# Patient Record
Sex: Male | Born: 1965 | Race: White | Hispanic: No | Marital: Single | Smoking: Current every day smoker
Health system: Southern US, Community
[De-identification: ages and names within clinical notes are randomized; demographics above are authoritative.]

## PROBLEM LIST (undated history)

## (undated) DIAGNOSIS — E039 Hypothyroidism, unspecified: Secondary | ICD-10-CM

## (undated) DIAGNOSIS — C801 Malignant (primary) neoplasm, unspecified: Secondary | ICD-10-CM

## (undated) DIAGNOSIS — F32A Depression, unspecified: Secondary | ICD-10-CM

## (undated) DIAGNOSIS — M1990 Unspecified osteoarthritis, unspecified site: Secondary | ICD-10-CM

---

## 2018-12-03 DIAGNOSIS — C801 Malignant (primary) neoplasm, unspecified: Secondary | ICD-10-CM

## 2018-12-03 HISTORY — DX: Malignant (primary) neoplasm, unspecified: C80.1

## 2019-12-21 ENCOUNTER — Other Ambulatory Visit: Payer: Self-pay | Admitting: Radiation Oncology

## 2020-02-10 ENCOUNTER — Telehealth: Payer: Self-pay | Admitting: Hematology

## 2020-02-10 NOTE — Telephone Encounter (Signed)
Received a new pt referral from Dr. Cheryle Horsfall at [place] at [facility] for malignant neoplasm of floor of mouth. I cld and spoke to the pt's sister who's aware to arrive 15 minutes early.

## 2020-02-14 ENCOUNTER — Telehealth: Payer: Self-pay | Admitting: Radiation Oncology

## 2020-02-14 ENCOUNTER — Telehealth (HOSPITAL_COMMUNITY): Payer: Self-pay | Admitting: Dentistry

## 2020-02-14 NOTE — Telephone Encounter (Signed)
I have called and the sister of the patient, Vicente Males, stated that they were not ready to schedule a radiation consult. She states that she will call us when they are ready.

## 2020-02-14 NOTE — Telephone Encounter (Signed)
02/14/2020  Patient:            Casey [name] Date of Birth:  10-24-1965 MRN:                [id number]   I had a telephone conversation with the patient's sister, Hazle Coca. I discussed request for Pre-chemoradiation therapy Dental consultation from [name].  Sister indicates that she is currently scheduled for a Med. Onc. Consultation with Dr. Irene Limbo on Thursday, 02/18/20 at 1 pm to discuss possible treatment options. Sister indicates that the patient is completely edentulous and that she does NOT wish to proceed with scheduling Dental consult until she has met with Dr. Irene Limbo and [name]. Sister is aware that I will be unavailable for consult the week of 7/19 thru 02/24/20. I will follow up with sister for possible dental consult if so desired upon my return on 02/27/20.  Dr. Enrique Sack

## 2020-02-16 ENCOUNTER — Telehealth: Payer: Self-pay | Admitting: Hematology

## 2020-02-16 ENCOUNTER — Telehealth: Payer: Self-pay | Admitting: Hematology and Oncology

## 2020-02-16 ENCOUNTER — Inpatient Hospital Stay: Payer: Self-pay | Admitting: Hematology

## 2020-02-16 NOTE — Telephone Encounter (Signed)
Pt cld back to reschedule his appt to Dr. Lorenso Courier on 7/29 at 9am. Pt needed an earlier appt due to his sister's work schedule.

## 2020-02-16 NOTE — Telephone Encounter (Signed)
I received a scheduling msg to reschedule Mr. Laughter appt w/Dr. Irene Limbo. I cld and lft a vm to r/s the appt.

## 2020-02-20 ENCOUNTER — Telehealth: Payer: Self-pay | Admitting: *Deleted

## 2020-02-20 ENCOUNTER — Other Ambulatory Visit: Payer: Self-pay | Admitting: Radiation Oncology

## 2020-02-20 ENCOUNTER — Ambulatory Visit
Admission: RE | Admit: 2020-02-20 | Discharge: 2020-02-20 | Disposition: A | Discharge status: Home / Self Care | Payer: Self-pay | Source: Ambulatory Visit | Attending: Radiation Oncology | Admitting: Radiation Oncology

## 2020-02-20 DIAGNOSIS — C069 Malignant neoplasm of mouth, unspecified: Secondary | ICD-10-CM

## 2020-02-20 NOTE — Telephone Encounter (Signed)
PET scan images and reports arrived today and have been uploaded and scanned.

## 2020-02-22 LAB — SURGICAL PATHOLOGY

## 2020-03-01 ENCOUNTER — Inpatient Hospital Stay: Payer: Self-pay

## 2020-03-01 ENCOUNTER — Inpatient Hospital Stay: Payer: Self-pay | Attending: Hematology | Admitting: Hematology and Oncology

## 2020-03-01 NOTE — Progress Notes (Deleted)
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   CHIEF COMPLAINTS/PURPOSE OF CONSULTATION:  "*** "  HISTORY OF PRESENTING ILLNESS:  Casey [name] 54 y.o. male with medical history significant for ***  On review of the previous records ***  On exam today ***  MEDICAL HISTORY:  No past medical history on file.  SURGICAL HISTORY: *** The histories are not reviewed yet. Please review them in the "History" navigator section and refresh this [place].  SOCIAL HISTORY: Social History   Socioeconomic History  . Marital status: Not on file    Spouse name: Not on file  . Number of children: Not on file  . Years of education: Not on file  . Highest education level: Not on file  Occupational History  . Not on file  Tobacco Use  . Smoking status: Not on file  Substance and Sexual Activity  . Alcohol use: Not on file  . Drug use: Not on file  . Sexual activity: Not on file  Other Topics Concern  . Not on file  Social History Narrative  . Not on file   Social Determinants of Health   Financial Resource Strain:   . Difficulty of Paying Living Expenses:   Food Insecurity:   . Worried About Charity fundraiser in the Last Year:   . Arboriculturist in the Last Year:   Transportation Needs:   . Film/video editor (Medical):   Marland Kitchen Lack of Transportation (Non-Medical):   Physical Activity:   . Days of Exercise per Week:   . Minutes of Exercise per Session:   Stress:   . Feeling of Stress :   Social Connections:    . Frequency of Communication with Friends and Family:   . Frequency of Social Gatherings with Friends and Family:   . Attends Religious Services:   . Active Member of Clubs or Organizations:   . Attends Archivist Meetings:   Marland Kitchen Marital Status:   Intimate Partner Violence:   . Fear of Current or Ex-Partner:   . Emotionally Abused:   Marland Kitchen Physically Abused:   . Sexually Abused:     FAMILY HISTORY: No family history on file.  ALLERGIES:  has no allergies on file.  MEDICATIONS:  No current outpatient medications on file.   No current facility-administered medications for this visit.    REVIEW OF SYSTEMS:   Constitutional: ( - ) fevers, ( - )  chills , ( - ) night sweats Eyes: ( - ) blurriness of vision, ( - ) double vision, ( - ) watery eyes Ears, nose, mouth, throat, and face: ( - ) mucositis, ( - ) sore throat Respiratory: ( - ) cough, ( - ) dyspnea, ( - ) wheezes Cardiovascular: ( - ) palpitation, ( - ) chest discomfort, ( - ) lower extremity swelling Gastrointestinal:  ( - ) nausea, ( - ) heartburn, ( - ) change in bowel habits Skin: ( - ) abnormal skin rashes Lymphatics: ( - ) new lymphadenopathy, ( - ) easy bruising Neurological: ( - ) numbness, ( - ) tingling, ( - )  new weaknesses Behavioral/Psych: ( - ) mood change, ( - ) new changes  All other systems were reviewed with the patient and are negative.  PHYSICAL EXAMINATION: ECOG PERFORMANCE STATUS: {CHL ONC ECOG PS:[id number]}  There were no vitals filed for this visit. There were no vitals filed for this visit.  GENERAL: well appearing *** in NAD  SKIN: skin color, texture, turgor are normal, no rashes or significant lesions EYES: conjunctiva are pink and non-injected, sclera clear OROPHARYNX: no exudate, no erythema; lips, buccal mucosa, and tongue normal  NECK: supple, non-tender LYMPH:  no palpable lymphadenopathy in the cervical, axillary or inguinal LUNGS: clear to auscultation and percussion with  normal breathing effort HEART: regular rate & rhythm and no murmurs and no lower extremity edema ABDOMEN: soft, non-tender, non-distended, normal bowel sounds Musculoskeletal: no cyanosis of digits and no clubbing  PSYCH: alert & oriented x 3, fluent speech NEURO: no focal motor/sensory deficits  LABORATORY DATA:  I have reviewed the data as listed No flowsheet data found.  No flowsheet data found.   PATHOLOGY: ***  BLOOD FILM: *** Review of the peripheral blood smear showed normal appearing white cells with neutrophils that were appropriately lobated and granulated. There was no predominance of bi-lobed or hyper-segmented neutrophils appreciated. No Dohle bodies were noted. There was no left shifting, immature forms or blasts noted. Lymphocytes remain normal in size without any predominance of large granular lymphocytes. Red cells show no anisopoikilocytosis, macrocytes , microcytes or polychromasia. There were no schistocytes, target cells, echinocytes, acanthocytes, dacrocytes, or stomatocytes.There was no rouleaux formation, nucleated red cells, or intra-cellular inclusions noted. The platelets are normal in size, shape, and color without any clumping evident.  RADIOGRAPHIC STUDIES: I have personally reviewed the radiological images as listed and agreed with the findings in the report. No results found.  ASSESSMENT & PLAN ***  No orders of the defined types were placed in this encounter.   All questions were answered. The patient knows to call the clinic with any problems, questions or concerns.  A total of more than {CHL ONC TIME VISIT - RXYVO:[id number]} were spent on this encounter and over half of that time was spent on counseling and coordination of care as outlined above.   Ledell Peoples, MD Department of Hematology/Oncology [place] at [facility] Phone: [phone] Pager: [phone] Email: [email or web address]  03/01/2020 9:06  AM

## 2020-03-06 NOTE — Progress Notes (Signed)
Casey [name] CONSULT NOTE  Patient Care Team: System, Pcp Not In as PCP - General Malmfelt, Stephani Police, RN as Oncology Nurse Navigator Casey [name], Casey Spring, MD as Consulting Physician (Hematology) Casey Gibson, MD as Attending Physician (Radiation Oncology)  CHIEF COMPLAINTS/PURPOSE OF CONSULTATION:  Newly diagnosed cancer of the floor of the mouth   HISTORY OF PRESENTING ILLNESS:  Casey [name] 54 y.o. [name] is here because of recent diagnosis of a malignant neoplasm of the floor of the mouth. He is referred by Dr. Cheryle [name] at [place] at [facility]. Patient first noted the mass in December 2020. In March 2021 he underwent dental etxractions and his dentists referred him for evaluation. 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 CT maxillofacial on 12/22/19 showed postsurgical changes and mandibular invasion, with cervical lymphadenopathy consistent with metastatic nodes. PET scan on 02/16/20 showed the tongue mass with lymph node metastases on the right floor of the mouth and cervical lymph nodes. He presents to the clinic today for initial evaluation and discussion of treatment options.   I reviewed his records extensively and collaborated the history with the patient.  SUMMARY OF ONCOLOGIC HISTORY: Oncology History  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 ([place])  12/21/2019 Initial Diagnosis   In March 2021 he underwent dental etxractions and his dentists referred him for evaluation. 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 CT maxillofacial on 12/22/19 showed postsurgical changes and mandibular invasion, with cervical lymphadenopathy consistent with metastatic nodes. PET scan on 02/16/20 showed the tongue mass with lymph node metastases on the right floor of the mouth and cervical lymph nodes.   03/07/2020 Cancer  Staging   Staging form: Oral Cavity, AJCC 8th Edition - Clinical: Stage IVA (cT4a, cN2b, cM0) - Signed by Casey Lose, MD on 03/07/2020      MEDICAL HISTORY: No past medical problems other than tobacco abuse and alcohol use  SURGICAL HISTORY: Denies any prior surgeries  SOCIAL HISTORY: Tobacco abuse 1 pack/day, alcohol several beers per day, never married does not have any children FAMILY HISTORY: No family history of cancers  ALLERGIES: No known drug allergies MEDICATIONS:  Current Outpatient Medications  Medication Sig Dispense Refill  . oxyCODONE-acetaminophen (PERCOCET) 5-325 MG tablet Take 1 tablet by mouth every 6 (six) hours as needed for severe pain. 90 tablet 0   No current facility-administered medications for this visit.    REVIEW OF SYSTEMS:   Constitutional: Denies fevers, chills or abnormal night sweats Eyes: Denies blurriness of vision, double vision or watery eyes Ears, nose, mouth, throat, and face: Occasional difficulty with swallowing, edentulous Respiratory: Denies cough, dyspnea or wheezes Cardiovascular: Denies palpitation, chest discomfort or lower extremity swelling Gastrointestinal:  Denies nausea, heartburn or change in bowel habits Skin: Denies abnormal skin rashes Lymphatics: Lymphadenopathy in the neck bilaterally Neurological:Denies numbness, tingling or new weaknesses Behavioral/Psych: Mood is stable, no new changes  All other systems were reviewed with the patient and are negative.  PHYSICAL EXAMINATION: ECOG PERFORMANCE STATUS: 1 - Symptomatic but completely ambulatory  Vitals:   03/07/20 1600  BP: 115/78  Pulse: 75  Resp: 19  Temp: 98.9 F (37.2 C)  SpO2: 100%   Filed Weights   03/07/20 1600  Weight: 133 lb 6.4 oz (60.5 kg)    GENERAL:alert, no distress and comfortable SKIN: skin color, texture, turgor are normal, no rashes or significant lesions EYES:  normal, conjunctiva are pink and non-injected, sclera clear OROPHARYNX:  Bulky bilateral submandibular and cervical lymphadenopathy NECK: supple, thyroid normal size, non-tender, without nodularity LYMPH:  no palpable lymphadenopathy in the cervical, axillary or inguinal LUNGS: clear to auscultation and percussion with normal breathing effort HEART: regular rate & rhythm and no murmurs and no lower extremity edema ABDOMEN:abdomen soft, non-tender and normal bowel sounds Musculoskeletal:no cyanosis of digits and no clubbing  PSYCH: alert & oriented x 3 with fluent speech NEURO: no focal motor/sensory deficits   RADIOGRAPHIC STUDIES: I have personally reviewed the radiological reports and agreed with the findings in the report.  ASSESSMENT AND PLAN:  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 ([place]) 12/21/2019: In March 2021 he underwent dental etxractions and his dentists referred him for evaluation. 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  CT maxillofacial on 12/22/19 showed postsurgical changes and mandibular invasion, with cervical lymphadenopathy consistent with metastatic nodes.  MRI neck: [facility]: 12/23/2019: Floor of mouth mass 3.4 cm with involvement of genioglossus muscle and erosion of inner cortex of the right mandible, multiple necrotic level 1B lymph nodes largest 1.6 cm T4AN2B M0 stage IVa clinical stage  PET scan on 02/16/20 showed the tongue mass with lymph node metastases on the right floor of the mouth and cervical lymph nodes. CK7 and CD 117 are negative, p63 and CK 5/6 are positive, p16 is negative  Counseling: I discussed with the patient that he has a fairly advanced floor of mouth cancer that is invading to the mandible with involvement of lymph nodes.  This makes it a stage IVa disease.  However it is considered treatable and potentially curable.  Recommendation: 1.  Concurrent chemoradiation with weekly cisplatin 2. PEG tube and port placement 3.  Patient  will require IV fluids on a weekly basis 4.  Hearing assessment for baseline  Chemo counseling: I discussed the risks and benefits of cisplatin including the risk of cytopenias, mucositis, neuropathy, hearing loss, fatigue, loss of taste and appetite  Return to clinic to start treatment as soon as radiation is ready.     All questions were answered. The patient knows to call the clinic with any problems, questions or concerns.   Rulon Eisenmenger, MD, MPH 03/07/2020    I, Molly Dorshimer, am acting as scribe for Casey Lose, MD.  I have reviewed the above documentation for accuracy and completeness, and I agree with the above.

## 2020-03-07 ENCOUNTER — Inpatient Hospital Stay: Payer: Medicaid Other | Attending: Hematology | Admitting: Hematology and Oncology

## 2020-03-07 ENCOUNTER — Other Ambulatory Visit: Payer: Self-pay

## 2020-03-07 VITALS — BP 115/78 | HR 75 | Temp 98.9°F | Resp 19 | Ht 64.0 in | Wt 133.4 lb

## 2020-03-07 DIAGNOSIS — F17210 Nicotine dependence, cigarettes, uncomplicated: Secondary | ICD-10-CM | POA: Diagnosis not present

## 2020-03-07 DIAGNOSIS — C049 Malignant neoplasm of floor of mouth, unspecified: Secondary | ICD-10-CM | POA: Insufficient documentation

## 2020-03-07 MED ORDER — DEXAMETHASONE 4 MG PO TABS: 8.0000 mg | ORAL_TABLET | Freq: Every day | ORAL | 1 refills | Status: DC

## 2020-03-07 MED ORDER — LORAZEPAM 0.5 MG PO TABS: 0.5000 mg | ORAL_TABLET | Freq: Every evening | ORAL | 1 refills | Status: DC | PRN

## 2020-03-07 MED ORDER — LIDOCAINE-PRILOCAINE 2.5-2.5 % EX CREA: TOPICAL_CREAM | CUTANEOUS | 3 refills | Status: DC

## 2020-03-07 MED ORDER — ONDANSETRON HCL 8 MG PO TABS: 8.0000 mg | ORAL_TABLET | Freq: Two times a day (BID) | ORAL | 1 refills | Status: DC | PRN

## 2020-03-07 MED ORDER — PROCHLORPERAZINE MALEATE 10 MG PO TABS: 10.0000 mg | ORAL_TABLET | Freq: Four times a day (QID) | ORAL | 1 refills | Status: DC | PRN

## 2020-03-07 MED ORDER — OXYCODONE-ACETAMINOPHEN 5-325 MG PO TABS: 1.0000 | ORAL_TABLET | Freq: Four times a day (QID) | ORAL | 0 refills | Status: DC | PRN

## 2020-03-07 NOTE — Progress Notes (Signed)
Oncology Nurse Navigator Documentation  Met with patient during initial consult with Mr. Fluegge He was accompanied by his sister, Vicente Males. . Introduced myself as his/their Navigator, explained my role as a member of the Care Team. . Provided New Patient Information packet: o Contact information for physician, this navigator, other members of the Care Team o Advance Directive information ([place] blue pamphlet with LCSW insert); provided [facility] AD booklet at his request, encouraged him to contact Gwinda Maine, LCSW, to complete. o Fall Prevention Patient [place] sheet o Symptom Management Clinic information o [facility] campus map with highlight of [place] o SLP Information sheet . Provided and discussed educational handouts for PEG and PAC. Marland Kitchen Assisted with post-consult appt scheduling . I encouraged them to call with questions/concerns moving forward.  They verbalized understanding of above information. They are aware that they will be contacted by Dr. Ritta Slot office for dental evaluation, and Dr. Pearlie Oyster office for Radiation Oncology consult. I will also arrange an audiology exam pre chemotherapy.   [name], RN, BSN, OCN Head & Neck Oncology Nurse [place] at [place] [phone]

## 2020-03-07 NOTE — Assessment & Plan Note (Signed)
12/21/2019: In March 2021 he underwent dental etxractions and his dentists referred him for evaluation. 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  CT maxillofacial on 12/22/19 showed postsurgical changes and mandibular invasion, with cervical lymphadenopathy consistent with metastatic nodes.  MRI neck: [facility]: 12/23/2019: Floor of mouth mass 3.4 cm with involvement of genioglossus muscle and erosion of inner cortex of the right mandible, multiple necrotic level 1B lymph nodes largest 1.6 cm  PET scan on 02/16/20 showed the tongue mass with lymph node metastases on the right floor of the mouth and cervical lymph nodes. CK7 and CD 117 are negative, p63 and CK 5/6 are positive, p16 is negative  Counseling: I discussed with the patient that he has a fairly advanced floor of mouth cancer that is invading to the mandible with involvement of lymph nodes.  This makes it a stage IVa disease.  However it is considered treatable and potentially curable.  Recommendation: 1.  Concurrent chemoradiation with weekly cisplatin 2. PEG tube and port placement 3.  Patient will require IV fluids on a weekly basis 4.  Hearing assessment for baseline  Return to clinic to start treatment as soon as radiation is ready.

## 2020-03-07 NOTE — Progress Notes (Signed)
START ON PATHWAY REGIMEN - Head and Neck     A cycle is every 21 days:     Cisplatin   **Always confirm dose/schedule in your pharmacy ordering system**  Patient Characteristics: Oral Cavity, Preoperative or Nonsurgical Candidate, Stage III - IVB Disease Classification: Oral Cavity AJCC T Category: T4a AJCC M Category: M0 AJCC N Category: cN2b AJCC 8 Stage Grouping: IVA Therapeutic Status: Preoperative or Nonsurgical Candidate Intent of Therapy: Curative Intent, Discussed with Patient

## 2020-03-08 ENCOUNTER — Telehealth: Payer: Self-pay | Admitting: Hematology and Oncology

## 2020-03-08 NOTE — Telephone Encounter (Signed)
No 8/4 los, no changes made to pt schedule

## 2020-03-09 ENCOUNTER — Other Ambulatory Visit: Payer: Self-pay

## 2020-03-09 DIAGNOSIS — C049 Malignant neoplasm of floor of mouth, unspecified: Secondary | ICD-10-CM

## 2020-03-12 ENCOUNTER — Other Ambulatory Visit: Payer: Self-pay | Admitting: Hematology and Oncology

## 2020-03-12 ENCOUNTER — Ambulatory Visit
Admission: RE | Admit: 2020-03-12 | Discharge: 2020-03-12 | Disposition: A | Discharge status: Home / Self Care | Payer: Self-pay | Source: Ambulatory Visit | Attending: Radiation Oncology | Admitting: Radiation Oncology

## 2020-03-12 ENCOUNTER — Other Ambulatory Visit (HOSPITAL_COMMUNITY): Payer: Self-pay | Admitting: Dentistry

## 2020-03-12 ENCOUNTER — Other Ambulatory Visit: Payer: Self-pay | Admitting: Radiation Oncology

## 2020-03-12 ENCOUNTER — Telehealth (HOSPITAL_COMMUNITY): Payer: Self-pay | Admitting: Dentistry

## 2020-03-12 DIAGNOSIS — C069 Malignant neoplasm of mouth, unspecified: Secondary | ICD-10-CM

## 2020-03-12 MED ORDER — OXYCODONE-ACETAMINOPHEN 5-325 MG PO TABS: 1.0000 | ORAL_TABLET | Freq: Four times a day (QID) | ORAL | 0 refills | Status: DC | PRN

## 2020-03-12 NOTE — Progress Notes (Signed)
Oncology Nurse Navigator Documentation  I called patient to inform him of his Audiology exam appointment ordered by Dr. Lindi Adie. He is scheduled on 03/23/20 at 10:40 at [facility]. While on the phone with him, he wrote down the appointment date, time, address, and phone number. He knows to call me with any further questions or concerns.   [name] RN, BSN, OCN Head & Neck Oncology Nurse [place] at [facility] Phone # [phone]  Fax # [phone]

## 2020-03-12 NOTE — Telephone Encounter (Signed)
03/12/2020  Patient:            Casey [name] Date of Birth:  12/07/1965 MRN:                [id number]    Broken appointment-NO show-NO call. The patient was contacted on his cell phone after being 20 minutes late for his dental consultation appointment.  Patient indicated that he " had an emergency" and would not be able to make this appointment. Patient indicated that he would contact Dental Medicine to reschedule this appointment later in the week.   Doris Silverthorne/Edynn Gillock Carlos Levering, DDS

## 2020-03-13 ENCOUNTER — Telehealth: Payer: Self-pay

## 2020-03-13 ENCOUNTER — Encounter: Payer: Self-pay | Admitting: Licensed Clinical Social Worker

## 2020-03-13 NOTE — Progress Notes (Signed)
[place] Initial Psychosocial Assessment Clinical Social Work  Clinical Social Work contacted by phone to assess psychosocial, emotional, mental health, and spiritual needs of the patient.   Barriers to care/review of distress screen:  - Transportation:  Sister, brother-in-law and niece can help sometimes (sister works from home) but will need to utilize [employer] transport for other appts. Per pt, RN navigator stated she will work on this once appts scheduled. He does not have his own car. - Help at home & Support system:  Sister & her family can help sometimes and provide majority of pt support   CSW provided contact information and encouraged patient to call with any questions or concerns.  Clinical Social Worker follow up needed: No. Pt stated he will call as needed  [employer], LCSW

## 2020-03-19 NOTE — Progress Notes (Signed)
Head and Neck Cancer Location of Tumor / Histology:  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  Patient presented with symptoms of:    Biopsies revealed:  12/21/2019   Nutrition Status Yes No Comments  Weight changes? []  [x]    Swallowing concerns? [x]  []  Mainly eats softer foods, foods with sauce/gravym, or protein shakes  PEG? []  [x]  Scheduled for PEG placement 04/03/2020    Referrals Yes No Comments  Social Work? [x]  []    Dentistry? [x]  []    Swallowing therapy? [x]  []    Nutrition? [x]  []    Med/Onc? [x]  []  Dr. Nicholas Lose   Safety Issues Yes No Comments  Prior radiation? []  [x]    Pacemaker/ICD? []  [x]    Possible current pregnancy? []  [x]    Is the patient on methotrexate? []  [x]     Tobacco/Marijuana/Snuff/ETOH use:  Current every day smoker. Smokes ~1/2 pack of cigarettes daily. He also drinks 3-4 beers daily because they help "numb my tongue" (he reports a constant burning sensation to his tongue that is only relived by drinking beer--not his prn pain medicine)  Past/Anticipated interventions by otolaryngology, if any:  Has not been established with ENT here in [place] (had a provider in [place]). Scheduled to see an audiologist on 03/23/2020 before starting Cisplatin regimen.  Past/Anticipated interventions by medical oncology, if any:  Under care of Dr. Nicholas Lose 03/07/2020 Recommendation: 1. Concurrent chemoradiation with weekly cisplatin 2. PEG tube and port placement 3. Patient will require IV fluids on a weekly basis 4. Hearing assessment for baseline Return to clinic to start treatment as soon as radiation is ready.  Current Complaints / other details:   Nothing else of note

## 2020-03-20 ENCOUNTER — Other Ambulatory Visit: Payer: Self-pay

## 2020-03-20 ENCOUNTER — Encounter: Payer: Self-pay | Admitting: Radiation Oncology

## 2020-03-20 ENCOUNTER — Ambulatory Visit
Admission: RE | Admit: 2020-03-20 | Discharge: 2020-03-20 | Disposition: A | Discharge status: Home / Self Care | Payer: Self-pay | Source: Ambulatory Visit | Attending: Radiation Oncology | Admitting: Radiation Oncology

## 2020-03-20 DIAGNOSIS — C049 Malignant neoplasm of floor of mouth, unspecified: Secondary | ICD-10-CM

## 2020-03-20 DIAGNOSIS — C023 Malignant neoplasm of anterior two-thirds of tongue, part unspecified: Secondary | ICD-10-CM

## 2020-03-20 MED ORDER — LIDOCAINE VISCOUS HCL 2 % MT SOLN: OROMUCOSAL | 4 refills | Status: DC

## 2020-03-20 NOTE — Progress Notes (Signed)
Oncology Nurse Navigator Documentation  [name] requested that Casey [name] meet with a ENT surgeon at [facility] to discuss possible treatment options for Mr. Pesta before making a decision regarding treatment for his cancer. I notified [facility] of the request and they were able to schedule him to see Dr. Conley Canal tomorrow at 1:40 for evaluation.   [name] RN, BSN, OCN Head & Neck Oncology Nurse [place] at [facility] Phone # [phone]  Fax # [phone]

## 2020-03-20 NOTE — Progress Notes (Addendum)
Radiation Oncology         [phone] ________________________________  Initial outpatient Consultation by telephone.  The patient opted for telemedicine to maximize safety during the pandemic.  MyChart video was not obtainable.  Name: Casey [name] MRN: [id number]  Date: 03/20/2020  DOB: [birth year]  WU:[id number], Pcp Not In  Nicholas Lose, MD   REFERRING PHYSICIAN: Nicholas Lose, MD  DIAGNOSIS: C02.3   ICD-10-CM   1. 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 male who presented with a mass in his mouth approximately 8 months ago.  A few months later he was seen by a dentist who referred him for an evaluation.  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  Dr. Iran Planas was the surgeon he saw  in [facility], who discussed proceeding with a major surgery and reconstruction in June, but the patient declined.  The patient reports that the degree of morbidity from the surgery and the amount of time that would be expected to recover from the reconstruction was so significant that he did not want to go in that direction.  I reviewed his PET imaging from Dec 14, 2019 performed at [facility].  This demonstrates a hypermetabolic anterior tongue mass, right cervical level Ib adenopathy, and potential additional bilateral (but subtle) lymphadenopathy.  No distant metastatic disease.  CT maxillofacial performed in May 2021 potentially shows mandibular invasion.  MRI  performed in May 2021 measures the mass to be 3.4 x 2.5 x 3.2 cm with erosion of the inner cortex of the mandible.  Right level Ib adenopathy and potentially an 8 mm right level 3 node that is malignant.  I have reviewed all of the above images personally.  The patient moved to [facility] this summer. Arrangements were made for him to have consultations in our cancer center and he declined the comprehensive consultations initially.  Ultimately he was agreeable to seeing medical oncology and he recently saw my colleague Dr. Lindi Adie.  After that consultation he was amenable to being seen in radiation oncology.  Patient opted for telemedicine consultation today so physical exam cannot be conducted.  The patient states that the mass is growing and is "on the bottom of my tongue" and affects tongue movements. Gums bleeding from time to time, which he attributes to recent dental extractions.  Tobacco history, if any: he smokes 7-8 cigs/ day  ETOH abuse, if any: drinks beer, which helps "numb my tongue"  Mainly eat softer foods with gravies, or protein shakes.  He denies weight changes.  He is scheduled for PEG placement at the end of the month.  He lives in [place] with his sister now, after moving from [place].  He is interested in moving forward with concurrent chemoradiotherapy as discussed with Dr. Lindi Adie in medical oncology.  PREVIOUS RADIATION THERAPY: No  PAST MEDICAL HISTORY:  has no past medical history on file.    PAST SURGICAL HISTORY:History reviewed. No pertinent surgical history.  FAMILY HISTORY: family history  is not on file.  SOCIAL HISTORY:  reports that he has been smoking cigarettes. He has been smoking about 0.50 packs per day. He has never used smokeless tobacco. He reports current alcohol use of about 3.0 standard drinks of alcohol per week. He reports that he does not use drugs.  ALLERGIES: Patient has no allergy information on record.  MEDICATIONS:  Current Outpatient  Medications  Medication Sig Dispense Refill  . ibuprofen (ADVIL) 200 MG tablet Take 600 mg by mouth every 6 (six) hours as needed for moderate pain.    . Multiple Vitamin (MULTIVITAMIN) tablet Take 1 tablet by mouth daily.    Marland Kitchen oxyCODONE-acetaminophen (PERCOCET) 5-325 MG tablet Take 1 tablet by mouth every 6 (six) hours as needed for severe pain. 90 tablet 0  . dexamethasone (DECADRON) 4 MG tablet Take 2 tablets (8 mg total) by mouth daily. Take daily for 3 days after chemo. Take with food. (Patient not taking: Reported on 03/20/2020) 30 tablet 1  . lidocaine (XYLOCAINE) 2 % solution Patient: Mix 1part 2% viscous lidocaine, 1part H20. Swish/spit 69mL of diluted mixture to numb mouth, q 2hrs PRN. 200 mL 4  . lidocaine-prilocaine (EMLA) cream Apply to affected area once (Patient not taking: Reported on 03/20/2020) 30 g 3  . LORazepam (ATIVAN) 0.5 MG tablet Take 1 tablet (0.5 mg total) by mouth at bedtime as needed (Nausea or vomiting). (Patient not taking: Reported on 03/20/2020) 30 tablet 1  . ondansetron (ZOFRAN) 8 MG tablet Take 1 tablet (8 mg total) by mouth 2 (two) times daily as needed. Start on the third day after chemotherapy. (Patient not taking: Reported on 03/20/2020) 30 tablet 1  . prochlorperazine (COMPAZINE) 10 MG tablet Take 1 tablet (10 mg total) by mouth every 6 (six) hours as needed (Nausea or vomiting). (Patient not taking: Reported on 03/20/2020) 30 tablet 1   No current facility-administered medications for this encounter.    REVIEW OF SYSTEMS:  Notable for that above.   PHYSICAL EXAM:  vitals were not taken for this visit.   General: Alert and oriented, in no acute distress   LABORATORY DATA:  No results found for: WBC, HGB, HCT, MCV, PLT CMP  No results found for: NA, K, CL, CO2, GLUCOSE, BUN, CREATININE, CALCIUM, PROT, ALBUMIN, AST, ALT, ALKPHOS, BILITOT, GFRNONAA, GFRAA    No results found for: TSH   RADIOGRAPHY: No results found.    IMPRESSION/PLAN: This is a  delightful 54 year old gentleman with oral cavity cancer   Based on my review of the records and imaging, his best chance of cure likely involves surgery, though this probably would carry significant morbidity.  I offered to refer him to [facility] for a local otolaryngology consultation so that he can be fully informed and make the best possible decision moving forward (as described above, he declined surgery in [place] based on the discussion he had had with otolaryngology there, but this was months ago).  If he declines surgery then his best chance of cure would be chemoradiotherapy.  He also understands that even if he undergoes surgery, he will need adjuvant radiotherapy and possibly adjuvant chemotherapy as well.  We discussed the potential risks, benefits, and side effects of radiotherapy. We talked in detail about acute and late effects. We discussed that some of the most bothersome acute effects may be mucositis, dysgeusia, salivary changes, skin irritation, hair loss, dehydration, weight loss and fatigue. We talked about late effects which include but are not necessarily limited to dysphagia, hypothyroidism,  nerve injury, spinal cord injury, xerostomia, trismus, and neck edema. No guarantees of treatment were given. The patient is enthusiastic about proceeding with treatment. I look forward to participating in the patient's care.    Simulation (treatment planning) will take place depending on surgical plans (our navigator is going to try to obtain a consultation for him as soon as possible and make appropriate arrangements after that)  We also discussed that the treatment of head and neck cancer is a multidisciplinary process to maximize treatment outcomes and quality of life. For this reason the following referrals have been or will be made:   Medical oncology to discuss chemotherapy    Dentistry for dental evaluation, possible extractions in the radiation fields, and /or advice on  reducing risk of cavities, osteoradionecrosis, or other oral issues.   Nutritionist for nutrition support during and after treatment.   Speech language pathology for swallowing and/or speech therapy.   Social work for social support.    Physical therapy due to risk of lymphedema in neck and deconditioning.   Baseline labs including TSH.  I asked the patient today about tobacco use. The patient uses tobacco.  I advised the patient to quit. Services were offered by me today including outpatient counseling and pharmacotherapy. I assessed for the willingness to attempt to quit and provided encouragement and demonstrated willingness to make referrals and/or prescriptions to help the patient attempt to quit. The patient has follow-up with the oncologic team to touch base on their tobacco use and /or cessation efforts.  Over 3 minutes were spent on this issue.  He plans to quit by self tapering without pharmacotherapy.  I also advised him to stop drinking beer or alcohol as these, like tobacco or other forms of smoking or vaping, are carcinogenic and will worsen his prognosis.   This encounter was provided by telemedicine platform by telephone.  The patient opted for telemedicine to maximize safety during the pandemic.  MyChart video was not obtainable. The patient has given verbal consent for this type of encounter and has been advised to only accept a meeting of this type in a secure network environment. On date of service, in total, I spent 60 minutes on this encounter. The attendants for this meeting include Eppie Gibson  and Lianne Bushy Langton.  During the encounter, Eppie Gibson was located at [facility] Radiation Oncology Department.  Trevontae Lindahl Scoggins was located at home.   __________________________________________   Eppie Gibson, MD

## 2020-03-20 NOTE — Progress Notes (Signed)
Oncology Nurse Navigator Documentation  . Met with patient over the telephone during initial consult with [name].  Further introduced myself as his Navigator, explained my role as a member of the Care Team.   . Provided introductory explanation of radiation treatment including SIM planning and purpose of Aquaplast head and shoulder mask.   . I will call [facility] to set up an appointment for a consultation, and notify Mr. Campi of that appointment when scheduled per discussion with [name] during consultation today.  . I encouraged him to contact me with questions/concerns as treatments/procedures begin.  He verbalized understanding of information provided.    [name], RN, BSN, OCN Head & Neck Oncology Nurse [place] at [place] [phone]

## 2020-03-21 ENCOUNTER — Other Ambulatory Visit: Payer: Self-pay | Admitting: Radiation Oncology

## 2020-03-21 ENCOUNTER — Encounter: Payer: Self-pay | Admitting: Radiation Oncology

## 2020-03-21 ENCOUNTER — Telehealth: Payer: Self-pay | Admitting: General Practice

## 2020-03-21 ENCOUNTER — Telehealth: Payer: Self-pay | Admitting: Radiation Oncology

## 2020-03-21 DIAGNOSIS — C049 Malignant neoplasm of floor of mouth, unspecified: Secondary | ICD-10-CM

## 2020-03-21 DIAGNOSIS — R5381 Other malaise: Secondary | ICD-10-CM

## 2020-03-21 DIAGNOSIS — C023 Malignant neoplasm of anterior two-thirds of tongue, part unspecified: Secondary | ICD-10-CM | POA: Insufficient documentation

## 2020-03-21 NOTE — Telephone Encounter (Signed)
[place] CSW Progress Notes  Call to patient to introduce self and the Patient and [facility].  Left my contact information and encouarged him to call as needed. Will also plan to see him in [facility] when scheduled.  Edwyna Shell, LCSW Clinical Social Worker Phone:  [phone] Cell:  [phone]

## 2020-03-21 NOTE — Progress Notes (Signed)
Oncology Nurse Navigator Documentation  I received a phone call from [name] today. He has decided to decline an appointment with an ENT surgeon at [facility] that was recommended by [name]. He would like to proceed with chemotherapy/radiation. I have notified [name].  [name] RN, BSN, OCN Head & Neck Oncology Nurse [place] at [facility] Phone # [phone]  Fax # [phone]

## 2020-03-21 NOTE — Telephone Encounter (Signed)
Scheduled apt per 8/17 sch msg - left message with appt date and time

## 2020-03-22 ENCOUNTER — Other Ambulatory Visit: Payer: Self-pay | Admitting: Hematology and Oncology

## 2020-03-22 NOTE — Progress Notes (Signed)
Oncology Nurse Navigator Documentation  I spoke with Casey [name] today about his upcoming appointments at [facility]. He is scheduled for his CT simulation/planning session on 03/26/20 at 1:45 for IV start and 2:30 for planning. I will also provide PEG education after planning is complete. He is agreeable to the appointments and was provided the number to our transportation services to call and arrange transportation as needed. He knows to call me if he has any further needs or questions.   [name] RN, BSN, OCN Head & Neck Oncology Nurse [place] at [facility] Phone # [phone]  Fax # [phone]

## 2020-03-23 ENCOUNTER — Telehealth: Payer: Self-pay | Admitting: *Deleted

## 2020-03-23 NOTE — Telephone Encounter (Signed)
CALLED PATIENT TO INFORM OF LAB FOR 03-26-20 @ 1:30 PM, LVM FOR A RETURN CALL

## 2020-03-26 ENCOUNTER — Ambulatory Visit
Admission: RE | Admit: 2020-03-26 | Discharge: 2020-03-26 | Disposition: A | Discharge status: Home / Self Care | Payer: Medicaid Other | Source: Ambulatory Visit | Attending: Radiation Oncology | Admitting: Radiation Oncology

## 2020-03-26 ENCOUNTER — Ambulatory Visit: Payer: Medicaid Other

## 2020-03-26 ENCOUNTER — Encounter (HOSPITAL_COMMUNITY): Payer: Self-pay | Admitting: Dentistry

## 2020-03-26 ENCOUNTER — Other Ambulatory Visit: Payer: Self-pay

## 2020-03-26 ENCOUNTER — Ambulatory Visit (HOSPITAL_COMMUNITY): Payer: Self-pay | Admitting: Dentistry

## 2020-03-26 VITALS — BP 146/93 | HR 60 | Temp 97.8°F | Resp 18

## 2020-03-26 VITALS — BP 134/80 | HR 74 | Temp 98.9°F

## 2020-03-26 DIAGNOSIS — C049 Malignant neoplasm of floor of mouth, unspecified: Secondary | ICD-10-CM

## 2020-03-26 DIAGNOSIS — K08109 Complete loss of teeth, unspecified cause, unspecified class: Secondary | ICD-10-CM

## 2020-03-26 DIAGNOSIS — Z972 Presence of dental prosthetic device (complete) (partial): Secondary | ICD-10-CM

## 2020-03-26 DIAGNOSIS — C023 Malignant neoplasm of anterior two-thirds of tongue, part unspecified: Secondary | ICD-10-CM | POA: Insufficient documentation

## 2020-03-26 DIAGNOSIS — R5381 Other malaise: Secondary | ICD-10-CM

## 2020-03-26 DIAGNOSIS — K0820 Unspecified atrophy of edentulous alveolar ridge: Secondary | ICD-10-CM

## 2020-03-26 DIAGNOSIS — Z01818 Encounter for other preprocedural examination: Secondary | ICD-10-CM

## 2020-03-26 LAB — CBC WITH DIFFERENTIAL (CANCER CENTER ONLY)
Abs Immature Granulocytes: 0.04 10*3/uL (ref 0.00–0.07)
Basophils Absolute: 0.1 10*3/uL (ref 0.0–0.1)
Basophils Relative: 1 %
Eosinophils Absolute: 0.2 10*3/uL (ref 0.0–0.5)
Eosinophils Relative: 2 %
HCT: 36.6 % — ABNORMAL LOW (ref 39.0–52.0)
Hemoglobin: 12.7 g/dL — ABNORMAL LOW (ref 13.0–17.0)
Immature Granulocytes: 0 %
Lymphocytes Relative: 25 %
Lymphs Abs: 2.7 10*3/uL (ref 0.7–4.0)
MCH: 32.5 pg (ref 26.0–34.0)
MCHC: 34.7 g/dL (ref 30.0–36.0)
MCV: 93.6 fL (ref 80.0–100.0)
Monocytes Absolute: 0.7 10*3/uL (ref 0.1–1.0)
Monocytes Relative: 7 %
Neutro Abs: 7.1 10*3/uL (ref 1.7–7.7)
Neutrophils Relative %: 65 %
Platelet Count: 313 10*3/uL (ref 150–400)
RBC: 3.91 MIL/uL — ABNORMAL LOW (ref 4.22–5.81)
RDW: 13 % (ref 11.5–15.5)
WBC Count: 10.8 10*3/uL — ABNORMAL HIGH (ref 4.0–10.5)
nRBC: 0 % (ref 0.0–0.2)

## 2020-03-26 LAB — BUN & CREATININE (CHCC)
BUN: 10 mg/dL (ref 6–20)
Creatinine: 0.83 mg/dL (ref 0.61–1.24)
GFR, Est AFR Am: >60 mL/min (ref >60)
GFR, Estimated: >60 mL/min (ref >60)

## 2020-03-26 LAB — TSH: TSH: 0.973 u[IU]/mL (ref 0.320–4.118)

## 2020-03-26 MED ORDER — SODIUM CHLORIDE 0.9% FLUSH
10.0000 mL | Freq: Once | INTRAVENOUS | Status: AC
  Administered 2020-03-26: 10 mL via INTRAVENOUS

## 2020-03-26 NOTE — Progress Notes (Signed)
Has armband been applied?  Yes.    Does patient have an allergy to IV contrast dye?: No.   Has patient ever received premedication for IV contrast dye?: No.   Does patient take metformin?: No.  Date of lab work: March 26, 2020 BUN: 10 CR: 0.83  IV site: antecubital left, condition patent and no redness  Has IV site been added to flowsheet?  Yes.    Vitals:   03/26/20 1427  BP: (!) 146/93  Pulse: 60  Resp: 18  Temp: 97.8 F (36.6 C)  SpO2: 100%

## 2020-03-26 NOTE — Patient Instructions (Signed)

## 2020-03-26 NOTE — Progress Notes (Signed)
Oncology Nurse Navigator Documentation  To provide support, encouragement and care continuity, met with Casey [name] during his CT SIM. He was unaccompanied d/t [facility] COVID safety practices.   He tolerated procedure without difficulty, denied questions/concerns.   I toured him to [facility] treatment area, explained procedures for lobby registration, arrival to Radiation Waiting, arrival to tmt area and preparation for tmt.  He voiced understanding.   I encouraged him to call me prior to [facility].  I also met with Casey [name] to provide PEG/port education prior to 04/03/20 placement.  Provided port educational handout, showed example, provided guidance for post-surgical dsg removal, site care.  . Using  PEG teaching device   and Teach Back, provided education for PEG use and care, including: hand hygiene, gravity bolus administration of daily water flushes and nutritional supplement, fluids and medications; care of tube insertion site including daily dressing change and cleaning; S&S of infection.   . Casey [name]  correctly verbalized procedures for and provided correct return demonstration of gravity administration of water, dressing change and site care.  . I provided written instructions for PEG flushing/dressing change in support of verbal instruction.   . I provided/described contents of Start of Care Bolus Feeding Kit (3 60 cc syringes, 2 boxes 4x4 drainage sponges, 1 package mesh briefs, 1 roll paper tape, 1 case Osmolite 1.5).  He voiced understanding he is to start using Osmolite per guidance of Nutrition. Marland Kitchen He understands I will be available for ongoing PEG support. Provided barium sulfate prep which I obtained from [facility] IR, reviewed instructions which included guidance for 04/02/20 COVID screening on [employer].

## 2020-03-26 NOTE — Progress Notes (Signed)
DENTAL CONSULTATION  Date of Consultation:  03/26/2020 Patient Name:   Casey [name] Date of Birth:   [birth year] Medical Record Number: [id number]  COVID 19 SCREENING: The patient does not symptoms concerning for COVID-19 infection (Including fever, chills, cough, or new SHORTNESS OF BREATH).    VITALS: BP 134/80 (BP Location: Right Arm)   Pulse 74   Temp 98.9 F (37.2 C)   CHIEF COMPLAINT: Patient referred by [name] for a dental consultation.  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  Patient with anticipated chemoradiation therapy.  Patient is now seen as part of a medically necessary prechemoradiation therapy dental protocol examination.  Patient currently is edentulous.  Patient had all extractions performed in [place] area.  Patient had all his maxillary teeth extracted in March 2020 with subsequent insertion of an immediate upper complete denture.  Patient then had all remaining lower teeth extracted in December 2020 with attempt at insertion of the lower complete immediate denture.  There were problems with the insertion of the lower complete denture and this denture was never inserted.  Patient subsequently noted multiple masses involving the lower arch.  A biopsy was subsequently taken in May 2021 and the patient was diagnosed with floor of mouth cancer.  Patient was then seen for multiple treatment options including surgical resection and reconstruction.  Patient then made the decision to move back to [place] to be closer to his sister and family support in [place].  The patient is currently proceeding with chemoradiation therapies.  Patient indicates the upper complete denture is "too big" but is able to wear the denture.  Patient does not have a lower complete denture.  Patient denies having dental phobia.  PROBLEM LIST: Patient Active  Problem List   Diagnosis Date Noted  . 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 ([place]) 03/07/2020    Priority: High  . 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 No pertinent past medical history.  PSH: History reviewed. No pertinent surgical history.  ALLERGIES: Not on File  MEDICATIONS: Current Outpatient Medications  Medication Sig Dispense Refill  . ibuprofen (ADVIL) 200 MG tablet Take 600 mg by mouth every 6 (six) hours as needed for moderate pain.    Marland Kitchen lidocaine (XYLOCAINE) 2 % solution Patient: Mix 1part 2% viscous lidocaine, 1part H20. Swish/spit 4mL of diluted mixture to numb mouth, q 2hrs PRN. 200 mL 4  . Multiple Vitamin (MULTIVITAMIN) tablet Take 1 tablet by mouth daily.    Marland Kitchen oxyCODONE-acetaminophen (PERCOCET) 5-325 MG tablet Take 1 tablet by mouth every 6 (six) hours as needed for severe pain. 90 tablet 0  . dexamethasone (DECADRON) 4 MG tablet Take 2 tablets (8 mg total) by mouth daily. Take daily for 3 days after chemo. Take with food. (Patient not taking: Reported on 03/20/2020) 30 tablet 1  . lidocaine-prilocaine (EMLA) cream Apply to affected area once (Patient not taking: Reported on 03/20/2020) 30 g 3  . LORazepam (ATIVAN) 0.5 MG tablet Take 1 tablet (0.5 mg total) by mouth at bedtime as needed (Nausea or vomiting). (Patient not taking: Reported on 03/20/2020) 30 tablet 1  . ondansetron (ZOFRAN) 8 MG tablet Take 1 tablet (8 mg total) by mouth 2 (two) times daily as needed. Start on the third day after chemotherapy. (Patient not taking: Reported on 03/20/2020) 30 tablet 1  . prochlorperazine (COMPAZINE)  10 MG tablet Take 1 tablet (10 mg total) by mouth every 6 (six) hours as needed (Nausea or vomiting). (Patient not taking: Reported on 03/20/2020) 30 tablet 1   No current facility-administered medications for this visit.    LABS: No results found for: WBC, HGB, HCT, MCV, PLT No results found for: NA, K, CL, CO2,  GLUCOSE, BUN, CREATININE, CALCIUM, GFRNONAA, GFRAA No results found for: INR, PROTIME No results found for: PTT  SOCIAL HISTORY: Social History   Socioeconomic History  . Marital status: Single    Spouse name: Not on file  . Number of children: Not on file  . Years of education: Not on file  . Highest education level: Not on file  Occupational History  . Not on file  Tobacco Use  . Smoking status: Current Every Day Smoker    Packs/day: 0.50    Types: Cigarettes  . Smokeless tobacco: Never Used  Vaping Use  . Vaping Use: Never used  Substance and Sexual Activity  . Alcohol use: Yes    Alcohol/week: 3.0 standard drinks    Types: 3 Cans of beer per week    Comment: several beers "numbs my tongue"  . Drug use: Never  . Sexual activity: Not Currently  Other Topics Concern  . Not on file  Social History Narrative  . Not on file   Social Determinants of Health   Financial Resource Strain:   . Difficulty of Paying Living Expenses: Not on file  Food Insecurity:   . Worried About Charity fundraiser in the Last Year: Not on file  . Ran Out of Food in the Last Year: Not on file  Transportation Needs:   . Lack of Transportation (Medical): Not on file  . Lack of Transportation (Non-Medical): Not on file  Physical Activity:   . Days of Exercise per Week: Not on file  . Minutes of Exercise per Session: Not on file  Stress:   . Feeling of Stress : Not on file  Social Connections:   . Frequency of Communication with Friends and Family: Not on file  . Frequency of Social Gatherings with Friends and Family: Not on file  . Attends Religious Services: Not on file  . Active Member of Clubs or Organizations: Not on file  . Attends Archivist Meetings: Not on file  . Marital Status: Not on file  Intimate Partner Violence:   . Fear of Current or Ex-Partner: Not on file  . Emotionally Abused: Not on file  . Physically Abused: Not on file  . Sexually Abused: Not on file     FAMILY HISTORY: History reviewed. No pertinent family history.  REVIEW OF SYSTEMS: Reviewed with the patient as per History of present illness. Psych: Patient denies having dental phobia.  DENTAL HISTORY: CHIEF COMPLAINT: Patient referred by [name] for a dental consultation.  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  Patient with anticipated chemoradiation therapy.  Patient is now seen as part of a medically necessary prechemoradiation therapy dental protocol examination.  Patient currently is edentulous.  Patient had all extractions performed in [place] area.  Patient had all his maxillary teeth extracted in March 2020 with subsequent insertion of an immediate upper complete denture.  Patient then had all remaining lower teeth extracted in December 2020 with attempt at insertion of the lower complete immediate denture.  There were problems with the insertion  of the lower complete denture and this denture was never inserted.  Patient subsequently noted multiple masses involving the lower arch.  A biopsy was subsequently taken in May 2021 and the patient was diagnosed with floor of mouth cancer.  Patient was then seen for multiple treatment options including surgical resection and reconstruction.  Patient then made the decision to move back to [place] to be closer to his sister and family support in [place].  The patient is currently proceeding with chemoradiation therapies.  Patient indicates the upper complete denture is "too big" but is able to wear the denture.  Patient does not have a lower complete denture.  Patient denies having dental phobia.  DENTAL EXAMINATION: GENERAL: The patient is a well-developed, well-nourished male no acute distress. HEAD AND NECK: Patient has right neck lymphadenopathy.  I do not palpate any left neck  lymphadenopathy. INTRAORAL EXAM: Patient has normal saliva.  There is no evidence of oral abscess formation.  The patient has a deep palatal vault. DENTITION:The patient is edentulous.  The anterior floor of mouth and tongue are tethered together. PROSTHODONTIC: Patient has an upper complete denture with reline present from the immediate insertion date.  The upper complete denture is stable and retentive.  Patient denies having a lower complete denture. OCCLUSION: Patient has a poor occlusal scheme secondary to not having a lower complete denture.  RADIOGRAPHIC INTERPRETATION: Orthopantogram was taken today. The patient is edentulous.  There is atrophy of the edentulous alveolar ridges.  There is a decreased opacity of the mandibular bone consistent with osteopenia or osteoporosis.  There is a radiolucent area involving the mandibular anterior area from #22 through 27 consistent with cancer diagnosis and mandibular involvement.   ASSESSMENTS: 1.  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 2.  Prechemoradiation therapy dental protocol 3.  Patient is edentulous 4.  There is atrophy of the edentulous alveolar ridges. 5.  Maxillary complete denture with presence of the immediate reline material   PLAN/RECOMMENDATIONS: 1. I discussed the risks, benefits, and complications of various treatment options with the patient in relationship to his medical and dental conditions, anticipated chemoradiation therapy, chemoradiation therapy side effects to include xerostomia, and trismus, mucositis, taste changes, gum and jawbone changes, and risk for infection and osteoradionecrosis. We discussed various treatment options to include no treatment, implant therapy, and replacement of missing teeth as indicated.  We also discussed need for future evaluation for prosthodontics rehabilitation with an oral maxillofacial prosthodontist approximately 2 to 3 months after chemoradiation  therapy is complete.  Patient currently wishes to defer any dental treatment at this time and will follow up with an oral and maxillofacial prosthodontist 2 to 3 months after chemoradiation therapy is complete to discuss possible treatment options.  Patient understands that more than likely he will not be able to be restored with implants in the mandibular area but I will defer to the specialist concerning this treatment option.  Patient expresses understanding.  The patient is currently cleared for chemoradiation therapy at this time.  2. Discussion of findings with medical team and coordination of future medical and dental care as needed.  I spent in excess of  90 minutes during the conduct of this consultation and >50% of this time involved direct face-to-face encounter for counseling and/or coordination of the patient's care.    Lenn Cal, DDS

## 2020-03-27 ENCOUNTER — Telehealth: Payer: Self-pay | Admitting: Hematology and Oncology

## 2020-03-27 NOTE — Telephone Encounter (Signed)
Scheduled appt per 8/23 sch msg- pt is aware of appts addded

## 2020-03-28 ENCOUNTER — Inpatient Hospital Stay: Payer: Medicaid Other

## 2020-03-28 ENCOUNTER — Inpatient Hospital Stay (HOSPITAL_BASED_OUTPATIENT_CLINIC_OR_DEPARTMENT_OTHER): Payer: Medicaid Other

## 2020-03-28 ENCOUNTER — Other Ambulatory Visit: Payer: Self-pay

## 2020-03-28 DIAGNOSIS — Z23 Encounter for immunization: Secondary | ICD-10-CM | POA: Diagnosis not present

## 2020-03-28 NOTE — Progress Notes (Addendum)
Nutrition Assessment   Reason for Assessment: New floor of mouth cancer   ASSESSMENT:  54 year old male with SCC of floor of mouth and anterior tongue.  Planning concurrent chemotherapy and radiation therapy.  PEG and port planned for 8/31.  Met with patient in clinic for nutrition assessment.  Patient reports that he is able to eat soft foods (oatmeal, mashed potatoes, soups, beans, chili, salisbury steak, etc).  Drinks carnation instant breakfast powder mixed with whole milk.  Makes on protein shake of raw egg, milk, syrup, berries, banana. Has dentures but do not fit well.    Patient lives with sister. Patient does all cooking and grocery shopping.   Reports that he had a feeding tube placed in [facility] but never used it and it fell out (button tube??).    Nurse navigator has instructed on PEG use, cleaning and given PEG kit.     Medications: decadron, ativan, MVI, zofran, compazine, lidocaine   Labs: reviewed   Anthropometrics:   Height: 63 inches Weight: 135 lb 8/23 UBW: 142-143 lb about 6-7 months ago BMI: 23 6% weight loss in the last 6-7 months  Estimated Energy Needs  Kcals: 1800-2100 Protein: 90-105 g Fluid: > 1.8 L   NUTRITION DIAGNOSIS: Predicted suboptimal energy intake related to tongue cancer as evidenced by side effects from planned chemotherapy and radiation treatment effecting intake.     INTERVENTION:  Discussed importance of good nutrition during treatment and RD role Patient familiar with feeding tube and purpose of tube.   Discussed with patient that RD would recommend starting tube feeding via tube based oral intake and weekly weight checks.  If weight starts to decrease would gradually start nutrition via tube.   Discussed high calorie, high protein foods. Handout provided. Discussed blending, grinding foods as options.   Recommended patient to stop using raw egg in protein shake due to risk of salmonella.  Patient verbalized  understanding. Contact information provided   MONITORING, EVALUATION, GOAL: weight trends, intake, tube feeding   Next Visit: Sept 9 during IV fluids  Schuyler Olden B. Zenia Resides, [place], [place] Registered Dietitian [phone] (mobile)

## 2020-03-29 NOTE — Progress Notes (Signed)
   Covid-19 Vaccination Clinic  Name:  Casey [name]    MRN: [id number] DOB: 1966/03/04  03/29/2020  [name] was observed post Covid-19 immunization for 15 minutes without incident. He was provided with Vaccine Information Sheet and instruction to access the V-Safe system.   [name] was instructed to call 911 with any severe reactions post vaccine: Marland Kitchen Difficulty breathing  . Swelling of face and throat  . A fast heartbeat  . A bad rash all over body  . Dizziness and weakness   Immunizations Administered    Name Date Dose VIS Date Route   Pfizer COVID-19 Vaccine 03/28/2020  3:01 PM 0.3 mL 09/28/2018 Intramuscular   Manufacturer: [employer]   Lot: [id number]   [place]: [id number]

## 2020-03-31 ENCOUNTER — Other Ambulatory Visit (HOSPITAL_COMMUNITY): Payer: Self-pay

## 2020-04-02 ENCOUNTER — Other Ambulatory Visit: Payer: Self-pay | Admitting: Radiology

## 2020-04-02 ENCOUNTER — Other Ambulatory Visit: Payer: Self-pay | Admitting: Student

## 2020-04-02 ENCOUNTER — Other Ambulatory Visit (HOSPITAL_COMMUNITY)
Admission: RE | Admit: 2020-04-02 | Discharge: 2020-04-02 | Disposition: A | Discharge status: Home / Self Care | Payer: HRSA Program | Source: Ambulatory Visit | Attending: Hematology and Oncology | Admitting: Hematology and Oncology

## 2020-04-02 DIAGNOSIS — Z01812 Encounter for preprocedural laboratory examination: Secondary | ICD-10-CM | POA: Insufficient documentation

## 2020-04-02 DIAGNOSIS — C023 Malignant neoplasm of anterior two-thirds of tongue, part unspecified: Secondary | ICD-10-CM | POA: Diagnosis not present

## 2020-04-02 DIAGNOSIS — Z20822 Contact with and (suspected) exposure to covid-19: Secondary | ICD-10-CM | POA: Diagnosis not present

## 2020-04-02 LAB — SARS CORONAVIRUS 2 (TAT 6-24 HRS): SARS Coronavirus 2: NEGATIVE

## 2020-04-03 ENCOUNTER — Ambulatory Visit (HOSPITAL_COMMUNITY)
Admission: RE | Admit: 2020-04-03 | Discharge: 2020-04-03 | Disposition: A | Discharge status: Home / Self Care | Payer: Medicaid Other | Source: Ambulatory Visit | Attending: Hematology and Oncology | Admitting: Hematology and Oncology

## 2020-04-03 ENCOUNTER — Encounter (HOSPITAL_COMMUNITY): Payer: Self-pay

## 2020-04-03 ENCOUNTER — Other Ambulatory Visit: Payer: Self-pay

## 2020-04-03 DIAGNOSIS — C049 Malignant neoplasm of floor of mouth, unspecified: Secondary | ICD-10-CM

## 2020-04-03 DIAGNOSIS — F17210 Nicotine dependence, cigarettes, uncomplicated: Secondary | ICD-10-CM | POA: Diagnosis not present

## 2020-04-03 DIAGNOSIS — F1010 Alcohol abuse, uncomplicated: Secondary | ICD-10-CM | POA: Diagnosis not present

## 2020-04-03 HISTORY — PX: IR GASTROSTOMY TUBE MOD SED: IMG625

## 2020-04-03 HISTORY — PX: IR IMAGING GUIDED PORT INSERTION: IMG5740

## 2020-04-03 HISTORY — DX: Malignant (primary) neoplasm, unspecified: C80.1

## 2020-04-03 LAB — CBC WITH DIFFERENTIAL/PLATELET
Abs Immature Granulocytes: 0.04 10*3/uL (ref 0.00–0.07)
Basophils Absolute: 0.1 10*3/uL (ref 0.0–0.1)
Basophils Relative: 1 %
Eosinophils Absolute: 0.3 10*3/uL (ref 0.0–0.5)
Eosinophils Relative: 4 %
HCT: 38.4 % — ABNORMAL LOW (ref 39.0–52.0)
Hemoglobin: 13 g/dL (ref 13.0–17.0)
Immature Granulocytes: 1 %
Lymphocytes Relative: 33 %
Lymphs Abs: 2.3 10*3/uL (ref 0.7–4.0)
MCH: 32.3 pg (ref 26.0–34.0)
MCHC: 33.9 g/dL (ref 30.0–36.0)
MCV: 95.5 fL (ref 80.0–100.0)
Monocytes Absolute: 0.6 10*3/uL (ref 0.1–1.0)
Monocytes Relative: 9 %
Neutro Abs: 3.6 10*3/uL (ref 1.7–7.7)
Neutrophils Relative %: 52 %
Platelets: 284 10*3/uL (ref 150–400)
RBC: 4.02 MIL/uL — ABNORMAL LOW (ref 4.22–5.81)
RDW: 13.4 % (ref 11.5–15.5)
WBC: 6.9 10*3/uL (ref 4.0–10.5)
nRBC: 0 % (ref 0.0–0.2)

## 2020-04-03 LAB — COMPREHENSIVE METABOLIC PANEL
ALT: 25 U/L (ref 0–44)
AST: 33 U/L (ref 15–41)
Albumin: 4.6 g/dL (ref 3.5–5.0)
Alkaline Phosphatase: 65 U/L (ref 38–126)
Anion gap: 12 (ref 5–15)
BUN: 9 mg/dL (ref 6–20)
CO2: 26 mmol/L (ref 22–32)
Calcium: 9.4 mg/dL (ref 8.9–10.3)
Chloride: 98 mmol/L (ref 98–111)
Creatinine, Ser: 0.76 mg/dL (ref 0.61–1.24)
GFR calc Af Amer: >60 mL/min (ref >60)
GFR calc non Af Amer: >60 mL/min (ref >60)
Glucose, Bld: 107 mg/dL — ABNORMAL HIGH (ref 70–99)
Potassium: 4.9 mmol/L (ref 3.5–5.1)
Sodium: 136 mmol/L (ref 135–145)
Total Bilirubin: 1 mg/dL (ref 0.3–1.2)
Total Protein: 8.1 g/dL (ref 6.5–8.1)

## 2020-04-03 LAB — PROTIME-INR
INR: 0.9 (ref 0.8–1.2)
Prothrombin Time: 12 seconds (ref 11.4–15.2)

## 2020-04-03 MED ORDER — LIDOCAINE-EPINEPHRINE 1 %-1:100000 IJ SOLN
INTRAMUSCULAR | Status: AC | PRN
  Administered 2020-04-03 (×2): 10 mL via INTRADERMAL

## 2020-04-03 MED ORDER — CEFAZOLIN SODIUM-DEXTROSE 2-4 GM/100ML-% IV SOLN
2.0000 g | INTRAVENOUS | Status: AC
  Administered 2020-04-03: 2 g via INTRAVENOUS

## 2020-04-03 MED ORDER — MIDAZOLAM HCL 2 MG/2ML IJ SOLN
INTRAMUSCULAR | Status: AC | PRN
  Administered 2020-04-03 (×4): 1 mg via INTRAVENOUS

## 2020-04-03 MED ORDER — CEFAZOLIN SODIUM-DEXTROSE 2-4 GM/100ML-% IV SOLN
INTRAVENOUS | Status: AC
  Filled 2020-04-03: qty 100

## 2020-04-03 MED ORDER — HEPARIN SOD (PORK) LOCK FLUSH 100 UNIT/ML IV SOLN
INTRAVENOUS | Status: AC | PRN
  Administered 2020-04-03: 500 [IU] via INTRAVENOUS

## 2020-04-03 MED ORDER — LIDOCAINE HCL 1 % IJ SOLN
INTRAMUSCULAR | Status: AC
  Filled 2020-04-03: qty 20

## 2020-04-03 MED ORDER — MIDAZOLAM HCL 2 MG/2ML IJ SOLN
INTRAMUSCULAR | Status: AC
  Filled 2020-04-03: qty 6

## 2020-04-03 MED ORDER — LIDOCAINE-EPINEPHRINE 1 %-1:100000 IJ SOLN
INTRAMUSCULAR | Status: AC
  Filled 2020-04-03: qty 1

## 2020-04-03 MED ORDER — SODIUM CHLORIDE 0.9 % IV SOLN: INTRAVENOUS | Status: DC

## 2020-04-03 MED ORDER — GLUCAGON HCL RDNA (DIAGNOSTIC) 1 MG IJ SOLR
INTRAMUSCULAR | Status: AC
  Filled 2020-04-03: qty 1

## 2020-04-03 MED ORDER — HEPARIN SOD (PORK) LOCK FLUSH 100 UNIT/ML IV SOLN
INTRAVENOUS | Status: AC
  Filled 2020-04-03: qty 5

## 2020-04-03 MED ORDER — FENTANYL CITRATE (PF) 100 MCG/2ML IJ SOLN
INTRAMUSCULAR | Status: AC | PRN
  Administered 2020-04-03 (×4): 50 ug via INTRAVENOUS

## 2020-04-03 MED ORDER — GLUCAGON HCL (RDNA) 1 MG IJ SOLR
INTRAMUSCULAR | Status: AC | PRN
  Administered 2020-04-03: .5 mL via INTRAVENOUS

## 2020-04-03 MED ORDER — FENTANYL CITRATE (PF) 100 MCG/2ML IJ SOLN
INTRAMUSCULAR | Status: AC
  Filled 2020-04-03: qty 4

## 2020-04-03 MED ORDER — FENTANYL CITRATE (PF) 100 MCG/2ML IJ SOLN
25.0000 ug | Freq: Once | INTRAMUSCULAR | Status: AC
  Administered 2020-04-03: 25 ug via INTRAVENOUS
  Filled 2020-04-03: qty 2

## 2020-04-03 MED ORDER — LIDOCAINE HCL (PF) 1 % IJ SOLN
INTRAMUSCULAR | Status: AC | PRN
  Administered 2020-04-03: 10 mL via INTRADERMAL

## 2020-04-03 MED ORDER — IOHEXOL 300 MG/ML  SOLN
50.0000 mL | Freq: Once | INTRAMUSCULAR | Status: AC | PRN
  Administered 2020-04-03: 15 mL

## 2020-04-03 NOTE — Discharge Instructions (Signed)
How to Care for a Feeding Tube  A feeding tube is a tube used to give medicine, water, and liquid food. A person may have this tube if she or he has trouble swallowing or cannot take food or medicine. Supplies needed to care for the tube site:  Clean gloves.  Clean washcloth, gauze pads, or soft paper towel.  Cotton swabs.  A skin barrier ointment or cream, such as petroleum jelly.  Soap and water.  Pre-cut foam pads or gauze (for around the tube).  Tube tape.  An anchoring device (optional). How to care for the tube site  1. Have all supplies ready and close to you. 2. Wash your hands. 3. Put on gloves. 4. Change any pad or gauze near the tube if: ? It is dirty. ? It is wet. ? It has been there for more than one day. 5. Check the skin around the tube. Call the doctor if you see any of these: ? Red skin. ? A rash. ? Swelling. ? Leaking fluid. ? Extra skin. 6. Dip the gauze and cotton swabs in water and soap. 7. Use the cotton swabs to wipe the skin that is closest to the tube. 8. Use the gauze pads to wipe the rest of the skin near the tube. 9. Rinse with water. 10. Dry the area with a clean washcloth, dry gauze pad, or soft paper towel. 11. If the skin is red, use a cotton swab to put on a skin barrier cream or ointment. Put it on by making little circles. Do not apply antibiotic ointments at the tube site. 12. Put a new pre-cut foam pad or gauze around the tube. If there is no fluid at the tube site, you do not need a pad or gauze. 13. Tape down the edges. 14. Use tape or an anchoring device to attach the tube to the skin. Do this for comfort or as told. Each time you use tape, put it in a different place. 15. Sit the person up. 16. Throw away used supplies. 17. Take off your gloves. 18. Wash your hands. Supplies needed to flush a feeding tube:  Clean gloves.  A clean 60 mL syringe that connects to the feeding tube.  A towel.  Germ-free (sterile) or purified  water. Follow these rules: ? Use germ-free water if:  Your body's defense system (immune system) is weak and you have trouble getting better from infections (are immunocompromised).  You do not know how many chemicals are in your water. ? Do not use water from lakes or other bodies of water unless you treat it or filter it first. ? To make drinking water pure by boiling:  Boil water for 1 minute or longer. Keep a lid over the water while it boils.  Let the water cool off to room temperature before you use it. How to flush a feeding tube 1. Have all supplies ready and close to you. 2. Wash your hands. 3. Put on gloves. 4. Pull 30 mL of water into the syringe. 5. Before you push water into (flush) the tube, put the towel under the tube to catch any fluid leaks. 6. Bend (kink) the feeding tube while you do one of these things: ? Disconnect it from the feeding-bag tubing. ? Take off the cap at the end of the tube. 7. Put the tip of the syringe into the end of the feeding tube. 8. Stop bending the tube. 9. Use the syringe to slowly put the water  in the tube. If the water will not go in the tube: ? Have the person lie on his or her left side. ? Try putting the water in the tube again. ? Do not push hard to make the water go in. 10. Take out the syringe and put the cap on the tube. 11. Throw away used supplies. 12. Take off your gloves. 13. Wash your hands. Follow these instructions at home: Caring for the tube  If the person has a foam pad or gauze near the tube, change it: ? Every day. ? When it is dirty. ? When it is wet.  Do not put antibiotic ointments by the tube. Flushing the tube  Do not use a syringe that is smaller than 60 mL.  Flush the tube at all of these times: ? Before you give medicine. ? Between medicines. ? After the person gets the last medicine before a feeding.  Do not mix medicines with formula. Do not mix medicines with other medicines.  Completely  flush medicines through the tube. That way, they will not mix with formula. Contact a doctor if:  The tube gets blocked or clogged.  You find any of these on the skin around the tube site: ? Red skin. ? A rash. ? Swelling. ? Leaking fluid. ? Extra skin. Summary  A feeding tube is a tube used to give medicine, water, and liquid food. A person may have this tube if she or he has trouble swallowing or cannot take food or medicine.  Follow the doctor's instructions to care for the tube site and flush the tube every day.  Contact a doctor if the tube gets blocked or clogged. This information is not intended to replace advice given to you by your health care provider. Make sure you discuss any questions you have with your health care provider. Document Revised: 07/03/2017 Document Reviewed: 08/29/2016 Elsevier Patient Education  [place]. Gastrostomy Tube Home Guide, Adult A gastrostomy tube, or G-tube, is a tube that is inserted through the abdomen into the stomach. The tube is used to give feedings and medicines when a person is unable to eat and drink enough on his or her own. How to care for a G-tube Supplies needed  Saline solution or clean, warm water and soap.  Cotton swab or gauze.  Precut gauze bandage (dressing) and tape, if needed. Instructions 19. Wash your hands with soap and water. 20. If there is a dressing between the person's skin and the tube, remove it. 21. Check the area where the tube enters the skin. Check for problems such as: ? Redness. ? Swelling. ? Pus-like drainage. ? Extra skin growth. 22. Moisten the cotton swab with the saline solution or soap and water mixture. Gently clean around the insertion site. Remove any drainage or crusting. ? When the G-tube is first put in, a normal saline solution or water can be used to clean the skin. ? Mild soap and warm water can be used when the skin around the G-tube site has healed. 23. If there should be  a dressing between the person's skin and the tube, apply it at this time. How to flush a G-tube Flush the G-tube regularly to keep it from clogging. Flush it before and after feedings and as often as told by the health care provider. Supplies needed  Purified or sterile water, warmed. If the person has a weak disease-fighting (immune) system, or if he or she has difficulty fighting off infections (is  immunocompromised), use only sterile water. ? If you are unsure about the amount of chemical contaminants in purified or drinking water, use sterile water. ? To purify drinking water by boiling:  Boil water for at least 1 minute. Keep lid over water while it boils. Allow water to cool to room temperature before using.  60cc G-tube syringe. Instructions 14. Wash your hands with soap and water. 15. Draw up 30 mL of warm water in a syringe. 16. Connect the syringe to the tube. 17. Slowly and gently push the water into the tube. G-tube problems and solutions  If the tube comes out: ? Cover the opening with a clean dressing and tape. ? Call a health care provider right away. ? A health care provider will need to put the tube back in within 4 hours.  If there is skin or scar tissue growing where the tube enters the skin: ? Keep the area clean and dry. ? Secure the tube with tape so that the tube does not move around too much. ? Call a health care provider.  If the tube gets clogged: ? Slowly push warm water into the tube with a large syringe. ? Do not force the fluid into the tube or push an object into the tube. ? If you are not able to unclog the tube, call a health care provider right away. Follow these instructions at home: Feedings  Give feedings at room temperature.  Cover and place unused feedings in the refrigerator.  If feedings are continuous: ? Do not put more than 4 hours worth of feedings in the feeding bag. ? Stop the feedings when you need to give medicine or flush the  tube. Be sure to restart the feedings. ? Make sure the person's head is above his or her stomach (upright position). This will prevent choking and discomfort.  Replace feeding bags and syringes as told by the health care provider.  Make sure the person is in the right position during and after feedings: ? During feedings, the person's position should be in the upright position. ? After a noncontinuous feeding (bolus feeding), have the person stay in the upright position for 1 hour. General instructions  Only use syringes made for G-tubes.  Do not pull or put tension on the tube.  Clamp the tube before removing the cap or disconnecting a syringe.  Measure the length of the G-tube every day from the insertion site to the end of the tube.  If the person's G-tube has a balloon, check the fluid in the balloon every week. The amount of fluid that should be in the balloon can be found in the manufacturers specifications.  Make sure the person takes care of his or her oral health, such as by brushing his or her teeth.  Remove excess air from the G-tube as told by the person's health care provider. This is called "venting."  Keep the area where the tube enters the skin clean and dry.  Do not push feedings, medicines, or flushes rapidly. Contact a health care provider if:  The person with the tube has any of these problems: ? Constipation. ? Fever.  There is a large amount of fluid or mucus-like liquid leaking from the tube.  Skin or scar tissue appears to be growing where the tube enters the skin.  The length of tube from the insertion site to the G-tube gets longer. Get help right away if:  The person with the tube has any of these problems: ?  Severe abdominal pain. ? Severe tenderness. ? Severe bloating. ? Nausea. ? Vomiting. ? Trouble breathing. ? Shortness of breath.  Any of these problems happen in the area where the tube enters the skin: ? Redness, irritation,  swelling, or soreness. ? Pus-like discharge. ? A bad smell.  The tube is clogged and cannot be flushed.  The tube comes out. Summary  A gastrostomy tube, or G-tube, is a tube that is inserted through the abdomen into the stomach. The tube is used to give feedings and medicines when a person is unable to eat and drink enough on his or her own.  Check and clean the insertion site daily as told by the person's health care provider.  Flush the G-tube regularly to keep it from clogging. Flush it before and after feedings and as often as told by the person's health care provider.  Keep the area where the tube enters the skin clean and dry. This information is not intended to replace advice given to you by your health care provider. Make sure you discuss any questions you have with your health care provider. Document Revised: 07/03/2017 Document Reviewed: 09/15/2016 Elsevier Patient Education  [place] Insertion, Care After This sheet gives you information about how to care for yourself after your procedure. Your health care provider may also give you more specific instructions. If you have problems or questions, contact your health care provider. What can I expect after the procedure? After the procedure, it is common to have:  Discomfort at the port insertion site.  Bruising on the skin over the port. This should improve over 3-4 days. Follow these instructions at home: [facility] care  After your port is placed, you will get a manufacturer's information card. The card has information about your port. Keep this card with you at all times.  Take care of the port as told by your health care provider. Ask your health care provider if you or a family member can get training for taking care of the port at home. A home health care nurse may also take care of the port.  Make sure to remember what type of port you have. Incision care      Follow instructions from your  health care provider about how to take care of your port insertion site. Make sure you: ? Wash your hands with soap and water before and after you change your bandage (dressing). If soap and water are not available, use hand sanitizer. ? Change your dressing as told by your health care provider. ? Leave stitches (sutures), skin glue, or adhesive strips in place. These skin closures may need to stay in place for 2 weeks or longer. If adhesive strip edges start to loosen and curl up, you may trim the loose edges. Do not remove adhesive strips completely unless your health care provider tells you to do that.  Check your port insertion site every day for signs of infection. Check for: ? Redness, swelling, or pain. ? Fluid or blood. ? Warmth. ? Pus or a bad smell. Activity  Return to your normal activities as told by your health care provider. Ask your health care provider what activities are safe for you.  Do not lift anything that is heavier than 10 lb (4.5 kg), or the limit that you are told, until your health care provider says that it is safe. General instructions  Take over-the-counter and prescription medicines only as told by your health care provider.  Do  not take baths, swim, or use a hot tub until your health care provider approves. Ask your health care provider if you may take showers. You may only be allowed to take sponge baths.  Do not drive for 24 hours if you were given a sedative during your procedure.  Wear a medical alert bracelet in case of an emergency. This will tell any health care providers that you have a port.  Keep all follow-up visits as told by your health care provider. This is important. Contact a health care provider if:  You cannot flush your port with saline as directed, or you cannot draw blood from the port.  You have a fever or chills.  You have redness, swelling, or pain around your port insertion site.  You have fluid or blood coming from your port  insertion site.  Your port insertion site feels warm to the touch.  You have pus or a bad smell coming from the port insertion site. Get help right away if:  You have chest pain or shortness of breath.  You have bleeding from your port that you cannot control. Summary  Take care of the port as told by your health care provider. Keep the manufacturer's information card with you at all times.  Change your dressing as told by your health care provider.  Contact a health care provider if you have a fever or chills or if you have redness, swelling, or pain around your port insertion site.  Keep all follow-up visits as told by your health care provider. This information is not intended to replace advice given to you by your health care provider. Make sure you discuss any questions you have with your health care provider. Document Revised: 02/16/2018 Document Reviewed: 02/16/2018 Elsevier Patient Education  [place] An implanted port is a device that is placed under the skin. It is usually placed in the chest. The device can be used to give IV medicine, to take blood, or for dialysis. You may have an implanted port if:  You need IV medicine that would be irritating to the small veins in your hands or arms.  You need IV medicines, such as antibiotics, for a long period of time.  You need IV nutrition for a long period of time.  You need dialysis. Having a port means that your health care provider will not need to use the veins in your arms for these procedures. You may have fewer limitations when using a port than you would if you used other types of long-term IVs, and you will likely be able to return to normal activities after your incision heals. An implanted port has two main parts:  Reservoir. The reservoir is the part where a needle is inserted to give medicines or draw blood. The reservoir is round. After it is placed, it appears as a small,  raised area under your skin.  Catheter. The catheter is a thin, flexible tube that connects the reservoir to a vein. Medicine that is inserted into the reservoir goes into the catheter and then into the vein. How is my port accessed? To access your port:  A numbing cream may be placed on the skin over the port site.  Your health care provider will put on a mask and sterile gloves.  The skin over your port will be cleaned carefully with a germ-killing soap and allowed to dry.  Your health care provider will gently pinch the port and insert a  needle into it.  Your health care provider will check for a blood return to make sure the port is in the vein and is not clogged.  If your port needs to remain accessed to get medicine continuously (constant infusion), your health care provider will place a clear bandage (dressing) over the needle site. The dressing and needle will need to be changed every week, or as told by your health care provider. What is flushing? Flushing helps keep the port from getting clogged. Follow instructions from your health care provider about how and when to flush the port. Ports are usually flushed with saline solution or a medicine called heparin. The need for flushing will depend on how the port is used:  If the port is only used from time to time to give medicines or draw blood, the port may need to be flushed: ? Before and after medicines have been given. ? Before and after blood has been drawn. ? As part of routine maintenance. Flushing may be recommended every 4-6 weeks.  If a constant infusion is running, the port may not need to be flushed.  Throw away any syringes in a disposal container that is meant for sharp items (sharps container). You can buy a sharps container from a pharmacy, or you can make one by using an empty hard plastic bottle with a cover. How long will my port stay implanted? The port can stay in for as long as your health care provider  thinks it is needed. When it is time for the port to come out, a surgery will be done to remove it. The surgery will be similar to the procedure that was done to put the port in. Follow these instructions at home:   Flush your port as told by your health care provider.  If you need an infusion over several days, follow instructions from your health care provider about how to take care of your port site. Make sure you: ? Wash your hands with soap and water before you change your dressing. If soap and water are not available, use alcohol-based hand sanitizer. ? Change your dressing as told by your health care provider. ? Place any used dressings or infusion bags into a plastic bag. Throw that bag in the trash. ? Keep the dressing that covers the needle clean and dry. Do not get it wet. ? Do not use scissors or sharp objects near the tube. ? Keep the tube clamped, unless it is being used.  Check your port site every day for signs of infection. Check for: ? Redness, swelling, or pain. ? Fluid or blood. ? Pus or a bad smell.  Protect the skin around the port site. ? Avoid wearing bra straps that rub or irritate the site. ? Protect the skin around your port from seat belts. Place a soft pad over your chest if needed.  Bathe or shower as told by your health care provider. The site may get wet as long as you are not actively receiving an infusion.  Return to your normal activities as told by your health care provider. Ask your health care provider what activities are safe for you.  Carry a medical alert card or wear a medical alert bracelet at all times. This will let health care providers know that you have an implanted port in case of an emergency. Get help right away if:  You have redness, swelling, or pain at the port site.  You have fluid or blood coming from  your port site.  You have pus or a bad smell coming from the port site.  You have a fever. Summary  Implanted ports are  usually placed in the chest for long-term IV access.  Follow instructions from your health care provider about flushing the port and changing bandages (dressings).  Take care of the area around your port by avoiding clothing that puts pressure on the area, and by watching for signs of infection.  Protect the skin around your port from seat belts. Place a soft pad over your chest if needed.  Get help right away if you have a fever or you have redness, swelling, pain, drainage, or a bad smell at the port site. This information is not intended to replace advice given to you by your health care provider. Make sure you discuss any questions you have with your health care provider. Document Revised: 11/12/2018 Document Reviewed: 08/23/2016 Elsevier Patient Education  [place]. Moderate Conscious Sedation, Adult, Care After These instructions provide you with information about caring for yourself after your procedure. Your health care provider may also give you more specific instructions. Your treatment has been planned according to current medical practices, but problems sometimes occur. Call your health care provider if you have any problems or questions after your procedure. What can I expect after the procedure? After your procedure, it is common:  To feel sleepy for several hours.  To feel clumsy and have poor balance for several hours.  To have poor judgment for several hours.  To vomit if you eat too soon. Follow these instructions at home: For at least 24 hours after the procedure:   Do not: ? Participate in activities where you could fall or become injured. ? Drive. ? Use heavy machinery. ? Drink alcohol. ? Take sleeping pills or medicines that cause drowsiness. ? Make important decisions or sign legal documents. ? Take care of children on your own.  Rest. Eating and drinking  Follow the diet recommended by your health care provider.  If you vomit: ? Drink  water, juice, or soup when you can drink without vomiting. ? Make sure you have little or no nausea before eating solid foods. General instructions  Have a responsible adult stay with you until you are awake and alert.  Take over-the-counter and prescription medicines only as told by your health care provider.  If you smoke, do not smoke without supervision.  Keep all follow-up visits as told by your health care provider. This is important. Contact a health care provider if:  You keep feeling nauseous or you keep vomiting.  You feel light-headed.  You develop a rash.  You have a fever. Get help right away if:  You have trouble breathing. This information is not intended to replace advice given to you by your health care provider. Make sure you discuss any questions you have with your health care provider. Document Revised: 07/03/2017 Document Reviewed: 11/10/2015 Elsevier Patient Education  2020 [employer].

## 2020-04-03 NOTE — Sedation Documentation (Signed)
Patient is resting comfortably with eyes closed, in NAD.

## 2020-04-03 NOTE — Procedures (Signed)
Interventional Radiology Procedure Note  Procedure: RT IJ POWER PORT  20 FR PULL THRU GTUBE    Complications: None  Estimated Blood Loss:  MIN  Findings: TIP SVCRA  20FR GTUBE CONFIRMED IN THE STOMACH    M. Daryll Brod, MD

## 2020-04-03 NOTE — Discharge Instructions (Signed)
DO NOT APPLY ANY EMLA CREAM OR LOTIONS TO THE PORT SITE X 2 WEEKS  DO NOT SHOWER X 24 HRS  FOR ANY QUESTIONS OR CONCERNS CALL [phone]  FOR ANY QUESTIONS ABOUT G TUBE CALL  [phone]  PER DR. CLEAR LIQUIDS TONIGHT AND THEN REGULAR DIET TOMORROW   Implanted Port Insertion, Care After This sheet gives you information about how to care for yourself after your procedure. Your health care provider may also give you more specific instructions. If you have problems or questions, contact your health care provider. What can I expect after the procedure? After the procedure, it is common to have:  Discomfort at the port insertion site.  Bruising on the skin over the port. This should improve over 3-4 days. Follow these instructions at home: [facility] care  After your port is placed, you will get a manufacturer's information card. The card has information about your port. Keep this card with you at all times.  Take care of the port as told by your health care provider. Ask your health care provider if you or a family member can get training for taking care of the port at home. A home health care nurse may also take care of the port.  Make sure to remember what type of port you have. Incision care      Follow instructions from your health care provider about how to take care of your port insertion site. Make sure you: ? Wash your hands with soap and water before and after you change your bandage (dressing). If soap and water are not available, use hand sanitizer. ? Change your dressing as told by your health care provider. ? Leave stitches (sutures), skin glue, or adhesive strips in place. These skin closures may need to stay in place for 2 weeks or longer. If adhesive strip edges start to loosen and curl up, you may trim the loose edges. Do not remove adhesive strips completely unless your health care provider tells you to do that.  Check your port insertion site every day for signs of  infection. Check for: ? Redness, swelling, or pain. ? Fluid or blood. ? Warmth. ? Pus or a bad smell. Activity  Return to your normal activities as told by your health care provider. Ask your health care provider what activities are safe for you.  Do not lift anything that is heavier than 10 lb (4.5 kg), or the limit that you are told, until your health care provider says that it is safe. General instructions  Take over-the-counter and prescription medicines only as told by your health care provider.  Do not take baths, swim, or use a hot tub until your health care provider approves. Ask your health care provider if you may take showers. You may only be allowed to take sponge baths.  Do not drive for 24 hours if you were given a sedative during your procedure.  Wear a medical alert bracelet in case of an emergency. This will tell any health care providers that you have a port.  Keep all follow-up visits as told by your health care provider. This is important. Contact a health care provider if:  You cannot flush your port with saline as directed, or you cannot draw blood from the port.  You have a fever or chills.  You have redness, swelling, or pain around your port insertion site.  You have fluid or blood coming from your port insertion site.  Your port insertion site feels warm to  the touch.  You have pus or a bad smell coming from the port insertion site. Get help right away if:  You have chest pain or shortness of breath.  You have bleeding from your port that you cannot control. Summary  Take care of the port as told by your health care provider. Keep the manufacturer's information card with you at all times.  Change your dressing as told by your health care provider.  Contact a health care provider if you have a fever or chills or if you have redness, swelling, or pain around your port insertion site.  Keep all follow-up visits as told by your health care  provider. This information is not intended to replace advice given to you by your health care provider. Make sure you discuss any questions you have with your health care provider. Document Revised: 02/16/2018 Document Reviewed: 02/16/2018 Elsevier Patient Education  [place]. Gastrostomy Tube Replacement, Care After This sheet gives you information about how to care for yourself after your procedure. Your health care provider may also give you more specific instructions. If you have problems or questions, contact your health care provider. What can I expect after the procedure? After the procedure, it is common to have:  Mild pain in your abdomen.  A small amount of blood-tinged fluid leaking from the replacement site. Follow these instructions at home:   You may resume your normal level of activity.  You may resume your normal feedings.  Wash your hands before and after caring for your gastrostomy tube.  Check the skin around the tube site insertion for redness, a rash, swelling, drainage, or extra tissue growth. If you notice any of these, call your health care provider.  Care for your gastrostomy tube as you did before, or as directed by your health care provider. Contact a health care provider if:  You have a fever or chills.  You have redness or irritation near the insertion site.  You continue to have pain in the abdomen or leaking around your gastrostomy tube. Get help right away if:  You develop bleeding or a lot of discharge around the tube.  You have severe pain in the abdomen.  Your new tube is not working properly.  You are unable to get feedings into the tube.  Your tube comes out for any reason. Summary  Follow specific instructions as told by your health care provider. If you have problems or questions, contact your health care provider.  After the procedure you may resume your normal level of activity and normal feedings.  Care for your  gastrostomy tube as you did before, or as directed by your health care provider. This information is not intended to replace advice given to you by your health care provider. Make sure you discuss any questions you have with your health care provider. Document Revised: 08/26/2017 Document Reviewed: 08/26/2017 Elsevier Patient Education  [place]. Moderate Conscious Sedation, Adult, Care After These instructions provide you with information about caring for yourself after your procedure. Your health care provider may also give you more specific instructions. Your treatment has been planned according to current medical practices, but problems sometimes occur. Call your health care provider if you have any problems or questions after your procedure. What can I expect after the procedure? After your procedure, it is common:  To feel sleepy for several hours.  To feel clumsy and have poor balance for several hours.  To have poor judgment for several hours.  To vomit if you eat too soon. Follow these instructions at home: For at least 24 hours after the procedure:   Do not: ? Participate in activities where you could fall or become injured. ? Drive. ? Use heavy machinery. ? Drink alcohol. ? Take sleeping pills or medicines that cause drowsiness. ? Make important decisions or sign legal documents. ? Take care of children on your own.  Rest. Eating and drinking  Follow the diet recommended by your health care provider.  If you vomit: ? Drink water, juice, or soup when you can drink without vomiting. ? Make sure you have little or no nausea before eating solid foods. General instructions  Have a responsible adult stay with you until you are awake and alert.  Take over-the-counter and prescription medicines only as told by your health care provider.  If you smoke, do not smoke without supervision.  Keep all follow-up visits as told by your health care provider. This is  important. Contact a health care provider if:  You keep feeling nauseous or you keep vomiting.  You feel light-headed.  You develop a rash.  You have a fever. Get help right away if:  You have trouble breathing. This information is not intended to replace advice given to you by your health care provider. Make sure you discuss any questions you have with your health care provider. Document Revised: 07/03/2017 Document Reviewed: 11/10/2015 Elsevier Patient Education  2020 [employer].

## 2020-04-03 NOTE — Consult Note (Signed)
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  Patient reports that he had feeding tube placed in [facility] this year but never used it and it fell out.  He presents today for both Port-A-Cath and gastrostomy tube placements prior to chemoradiation.  Past Medical History:  Diagnosis Date  . Cancer ([place])     No past surgical history on file.  Allergies: Patient has no known allergies.  Medications: Prior to Admission medications   Medication Sig Start Date End Date Taking? Authorizing Provider  dexamethasone (DECADRON) 4 MG tablet Take 2 tablets (8 mg total) by mouth daily. Take daily for 3 days after chemo. Take with food. Patient not taking: Reported on 03/20/2020 03/07/20   Nicholas Lose, MD  ibuprofen (ADVIL) 200 MG tablet Take 600 mg by mouth every 6 (six) hours as needed for moderate pain.    [provider]  lidocaine (XYLOCAINE) 2 % solution Patient: Mix 1part 2% viscous lidocaine, 1part H20. Swish/spit 60mL of diluted mixture to numb mouth, q 2hrs PRN. 03/20/20   Eppie Gibson, MD  lidocaine-prilocaine (EMLA) cream Apply to affected area once Patient not taking: Reported on 03/20/2020 03/07/20   Nicholas Lose, MD  LORazepam (ATIVAN) 0.5 MG tablet Take 1 tablet (0.5 mg total) by mouth at bedtime as needed (Nausea or vomiting). Patient not taking: Reported on 03/20/2020 03/07/20   Nicholas Lose, MD  Multiple Vitamin (MULTIVITAMIN) tablet Take 1 tablet by mouth daily.    [provider]  ondansetron (ZOFRAN) 8 MG tablet Take 1 tablet (8 mg total) by mouth 2 (two) times daily as needed. Start on the third day  after chemotherapy. Patient not taking: Reported on 03/20/2020 03/07/20   Nicholas Lose, MD  oxyCODONE-acetaminophen (PERCOCET) 5-325 MG tablet Take 1 tablet by mouth every 6 (six) hours as needed for severe pain. 03/12/20   Nicholas Lose, MD  prochlorperazine (COMPAZINE) 10 MG tablet Take 1 tablet (10 mg total) by mouth every 6 (six) hours as needed (Nausea or vomiting). Patient not taking: Reported on 03/20/2020 03/07/20   Nicholas Lose, MD     No family history on file.  Social History   Socioeconomic History  . Marital status: Single    Spouse name: Not on file  . Number of children: Not on file  . Years of education: Not on file  . Highest education level: Not on file  Occupational History  . Not on file  Tobacco Use  . Smoking status: Current Every Day Smoker    Packs/day: 0.50    Types: Cigarettes  . Smokeless tobacco: Never Used  Vaping Use  . Vaping Use: Never used  Substance and Sexual Activity  . Alcohol use: Yes    Alcohol/week: 3.0 standard drinks    Types: 3 Cans of beer per week    Comment: several beers "numbs my tongue"  . Drug use: Never  . Sexual activity: Not Currently  Other Topics Concern  . Not on file  Social History Narrative  . Not on file   Social Determinants of Health   Financial Resource Strain:   . Difficulty of Paying Living Expenses: Not on file  Food Insecurity:   . Worried About Running  Out of Food in the Last Year: Not on file  . Ran Out of Food in the Last Year: Not on file  Transportation Needs:   . Lack of Transportation (Medical): Not on file  . Lack of Transportation (Non-Medical): Not on file  Physical Activity:   . Days of Exercise per Week: Not on file  . Minutes of Exercise per Session: Not on file  Stress:   . Feeling of Stress : Not on file  Social Connections:   . Frequency of Communication with Friends and Family: Not on file  . Frequency of Social Gatherings with Friends and Family: Not on file  . Attends Religious  Services: Not on file  . Active Member of Clubs or Organizations: Not on file  . Attends Archivist Meetings: Not on file  . Marital Status: Not on file      Review of Systems currently denies fever, headache, chest pain, dyspnea, abdominal/back pain, nausea, vomiting or bleeding.  He does have dysphagia, cough.   Vital Signs: Blood pressure 149/92, heart rate 66, temp 98.6, respirations 15, O2 sat 97% room air   Physical Exam awake, alert.  Chest clear to auscultation bilaterally.  Heart with regular rate and rhythm.  Abdomen soft, positive bowel sounds, nontender.  No lower extremity edema.  Imaging: No results found.  Labs:  CBC: Recent Labs    03/26/20 1440 04/03/20 0816  WBC 10.8* 6.9  HGB 12.7* 13.0  HCT 36.6* 38.4*  PLT 313 284    COAGS: No results for input(s): INR, APTT in the last 8760 hours.  BMP: Recent Labs    03/26/20 1435  BUN 10  CREATININE 0.83  GFRNONAA >60  GFRAA >60    LIVER FUNCTION TESTS: No results for input(s): BILITOT, AST, ALT, ALKPHOS, PROT, ALBUMIN in the last 8760 hours.  TUMOR MARKERS: No results for input(s): AFPTM, CEA, CA199, CHROMGRNA in the last 8760 hours.  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  Patient reports that he had feeding tube placed in [facility] this year but never used it and it fell out.  He presents today for both Port-A-Cath and gastrostomy tube placements prior to chemoradiation.  Details/risks of procedures, including but not limited to, internal bleeding, infection, injury to adjacent structures discussed with patient with his understanding and consent. He is COVID-19 negative.  Thank you for this interesting consult.  I greatly enjoyed meeting Leevi Cullars Gibb and look forward to participating in their care.  A copy of this report was sent to the requesting provider on this  date.  Electronically Signed: D. Rowe Robert, PA-C 04/03/2020, 8:47 AM   I spent a total of  25 minutes   in face to face in clinical consultation, greater than 50% of which was counseling/coor minutesdinating care for Port-A-Cath and gastrostomy tube placements

## 2020-04-03 NOTE — Progress Notes (Addendum)
Per Dr. Annamaria Boots, clear liquids tonight and then proceed to regular diet tomorrow

## 2020-04-03 NOTE — Progress Notes (Signed)
Per MD order, connected Gtube to low suction

## 2020-04-03 NOTE — Sedation Documentation (Signed)
Port a cath procedure completed. Patient prepped for Gastrostomy tube placement.

## 2020-04-03 NOTE — Sedation Documentation (Signed)
Gastrostomy tube placement procedure begun.

## 2020-04-04 ENCOUNTER — Ambulatory Visit
Admission: RE | Admit: 2020-04-04 | Discharge: 2020-04-04 | Disposition: A | Discharge status: Home / Self Care | Payer: Medicaid Other | Source: Ambulatory Visit | Attending: Radiation Oncology | Admitting: Radiation Oncology

## 2020-04-04 ENCOUNTER — Telehealth: Payer: Self-pay | Admitting: Nutrition

## 2020-04-04 DIAGNOSIS — C049 Malignant neoplasm of floor of mouth, unspecified: Secondary | ICD-10-CM | POA: Insufficient documentation

## 2020-04-04 DIAGNOSIS — C023 Malignant neoplasm of anterior two-thirds of tongue, part unspecified: Secondary | ICD-10-CM | POA: Insufficient documentation

## 2020-04-04 NOTE — Progress Notes (Signed)
Patient Care Team: System, Pcp Not In as PCP - General Malmfelt, Stephani Police, RN as Oncology Nurse Navigator Eppie Gibson, MD as Attending Physician (Radiation Oncology) Nicholas Lose, MD as Consulting Physician (Hematology and Oncology)  DIAGNOSIS:    ICD-10-CM   1. 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 CT maxillofacial on 12/22/19 showed postsurgical changes and mandibular invasion, with cervical lymphadenopathy consistent with metastatic nodes. PET scan on 02/16/20 showed the tongue mass with lymph node metastases on the right floor of the mouth and cervical lymph nodes.   03/07/2020 Cancer Staging   Staging form: Oral Cavity, AJCC 8th Edition - Clinical: Stage IVA (cT4a, cN2b, cM0) - Signed by Nicholas Lose, MD on 03/07/2020   04/06/2020 -  Chemotherapy   The patient had dexamethasone (DECADRON) 4 MG tablet, 8 mg, Oral, Daily, 1 of 1 cycle, Start date: 03/07/2020, End date: -- palonosetron (ALOXI) injection 0.25 mg, 0.25 mg, Intravenous,  Once, 0 of 3 cycles CISplatin (PLATINOL) 165 mg in sodium chloride 0.9 % 500 mL chemo infusion, 100 mg/m2 = 165 mg, Intravenous,  Once, 0 of 3 cycles fosaprepitant (EMEND) 150 mg in sodium chloride 0.9 % 145 mL IVPB, 150 mg, Intravenous,  Once, 0 of 3 cycles  for chemotherapy treatment.      CHIEF COMPLIANT: Follow-up of chemoradiation with cisplatin  INTERVAL HISTORY: Casey [name] is a 54 y.o. with above-mentioned history of floor of the mouth cancer. He is currently on concurrent chemoradiation treatment with every 3 week cisplatin which will be started tomorrow.  His radiation started  yesterday for. His port and G-tube were placed on 04/03/20. He presents to the clinic today to discuss the treatment plan.  ALLERGIES:  has No Known Allergies.  MEDICATIONS:  Current Outpatient Medications  Medication Sig Dispense Refill  . dexamethasone (DECADRON) 4 MG tablet Take 2 tablets (8 mg total) by mouth daily. Take daily for 3 days after chemo. Take with food. (Patient not taking: Reported on 03/20/2020) 30 tablet 1  . ibuprofen (ADVIL) 200 MG tablet Take 600 mg by mouth every 6 (six) hours as needed for moderate pain.    Marland Kitchen lidocaine (XYLOCAINE) 2 % solution Patient: Mix 1part 2% viscous lidocaine, 1part H20. Swish/spit 36mL of diluted mixture to numb mouth, q 2hrs PRN. 200 mL 4  . lidocaine-prilocaine (EMLA) cream Apply to affected area once (Patient not taking: Reported on 03/20/2020) 30 g 3  . LORazepam (ATIVAN) 0.5 MG tablet Take 1 tablet (0.5 mg total) by mouth at bedtime as needed (Nausea or vomiting). (Patient not taking: Reported on 03/20/2020) 30 tablet 1  . Multiple Vitamin (MULTIVITAMIN) tablet Take 1 tablet by mouth daily.    . ondansetron (ZOFRAN) 8 MG tablet Take 1 tablet (8 mg total) by mouth 2 (two) times daily as needed. Start on the third day after chemotherapy. (Patient not taking: Reported on 03/20/2020) 30 tablet 1  . oxyCODONE-acetaminophen (PERCOCET) 5-325 MG tablet Take 1 tablet by mouth every 6 (six) hours as needed for severe pain. 90 tablet 0  . prochlorperazine (COMPAZINE) 10 MG tablet Take 1 tablet (10 mg total) by mouth every 6 (six) hours as  needed (Nausea or vomiting). (Patient not taking: Reported on 03/20/2020) 30 tablet 1   No current facility-administered medications for this visit.    PHYSICAL EXAMINATION: ECOG PERFORMANCE STATUS: 1 - Symptomatic but completely ambulatory  Vitals:   04/05/20 0938  BP: (!) 152/87  Pulse: 77  Resp: 18  Temp: (!) 97 F (36.1 C)  SpO2: 100%   Filed Weights   04/05/20 0938  Weight: 132 lb 14.4 oz (60.3 kg)     LABORATORY DATA:  I have reviewed the data as listed CMP Latest Ref Rng & Units 04/03/2020 03/26/2020  Glucose 70 - 99 mg/dL 107(H) -  BUN 6 - 20 mg/dL 9 10  Creatinine 0.61 - 1.24 mg/dL 0.76 0.83  Sodium 135 - 145 mmol/L 136 -  Potassium 3.5 - 5.1 mmol/L 4.9 -  Chloride 98 - 111 mmol/L 98 -  CO2 22 - 32 mmol/L 26 -  Calcium 8.9 - 10.3 mg/dL 9.4 -  Total Protein 6.5 - 8.1 g/dL 8.1 -  Total Bilirubin 0.3 - 1.2 mg/dL 1.0 -  Alkaline Phos 38 - 126 U/L 65 -  AST 15 - 41 U/L 33 -  ALT 0 - 44 U/L 25 -    Lab Results  Component Value Date   WBC 11.3 (H) 04/05/2020   HGB 11.9 (L) 04/05/2020   HCT 34.2 (L) 04/05/2020   MCV 93.7 04/05/2020   PLT 282 04/05/2020   NEUTROABS 8.6 (H) 04/05/2020    ASSESSMENT & PLAN:  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  CT maxillofacial on 12/22/19 showed postsurgical changes and mandibular invasion, with cervical lymphadenopathy consistent with metastatic nodes.  MRI neck: [facility] [place]: 12/23/2019: Floor of mouth mass 3.4 cm with involvement of genioglossus muscle and erosion of inner cortex of the right mandible, multiple necrotic level 1B lymph nodes largest 1.6 cm [id number] M0 stage IVa clinical stage  PET scan on 02/16/20 showed the tongue mass with lymph node metastases on the right floor of the mouth and cervical lymph nodes. CK7 and CD 117 are negative, p63 and CK 5/6 are positive, p16 is negative  Treatment plan: 1.  Concurrent chemoradiation with weekly cisplatin 2. PEG tube and port placement 3.  Patient will require IV fluids on a weekly basis --------------------------------------------------------------------------------------------------------------------------------------- Current treatment: Cycle 1 cisplatin  will be tomorrow 04/06/2020 Chemo education chemo consent completed Patient will come weekly for IV fluid hydration I discussed with him extensively about protein intake as well as water intake. He was able to fill his medications.  I went over how to take these medications 1 more time.  Return to clinic in 1 week for toxicity check and follow-up    No orders of the defined types were placed in this encounter.  The patient has a good understanding of the overall plan. he agrees with it. he will call with any problems that may develop before the next visit here.  Total time spent: 30 mins including face to face time and time spent for planning, charting and coordination of care  Nicholas Lose, MD 04/05/2020  I, Cloyde Reams Dorshimer, am acting as scribe for Dr. Nicholas Lose.  I have reviewed the above documentation for accuracy and completeness, and I agree with the above.

## 2020-04-04 NOTE — Progress Notes (Signed)
.   Pharmacist Chemotherapy Monitoring - Initial Assessment    Anticipated start date: 04/06/20   Regimen:  . Are orders appropriate based on the patient's diagnosis, regimen, and cycle? Yes . Does the plan date match the patient's scheduled date? Yes . Is the sequencing of drugs appropriate? Yes . Are the premedications appropriate for the patient's regimen? Yes . Prior Authorization for treatment is: Uninsured o If applicable, is the correct biosimilar selected based on the patient's insurance? not applicable  Organ Function and Labs: Marland Kitchen Are dose adjustments needed based on the patient's renal function, hepatic function, or hematologic function? No . Are appropriate labs ordered prior to the start of patient's treatment? Yes . Other organ system assessment, if indicated: cisplatin: baseline audiogram . The following baseline labs, if indicated, have been ordered: N/A  Dose Assessment: . Are the drug doses appropriate? Yes . Are the following correct: o Drug concentrations Yes o IV fluid compatible with drug Yes o Administration routes Yes o Timing of therapy Yes . If applicable, does the patient have documented access for treatment and/or plans for port-a-cath placement? yes . If applicable, have lifetime cumulative doses been properly documented and assessed? not applicable Lifetime Dose Tracking  No doses have been documented on this patient for the following tracked chemicals: Doxorubicin, Epirubicin, Idarubicin, Daunorubicin, Mitoxantrone, Bleomycin, Oxaliplatin, Carboplatin, Liposomal Doxorubicin  o   Toxicity Monitoring/Prevention: . The patient has the following take home antiemetics prescribed: Prochlorperazine . The patient has the following take home medications prescribed: N/A . Medication allergies and previous infusion related reactions, if applicable, have been reviewed and addressed. Yes . The patient's current medication list has been assessed for drug-drug  interactions with their chemotherapy regimen. no significant drug-drug interactions were identified on review.  Order Review: . Are the treatment plan orders signed? Yes . Is the patient scheduled to see a provider prior to their treatment? Yes  I verify that I have reviewed each item in the above checklist and answered each question accordingly.  Wynona Neat 04/04/2020 8:50 AM

## 2020-04-04 NOTE — Progress Notes (Signed)
Oncology Nurse Navigator Documentation  To provide support, encouragement and care continuity, met with Mr. Kilker for his initial RT.    I reviewed the 2-step treatment process, answered questions.   Mr. Gage completed treatment without difficulty, denied questions/concerns.  I reviewed the registration/arrival procedure for subsequent treatments.  I provided him a calendar of all upcoming appointments that are currently scheduled.   I encouraged him to call me with questions/concerns as tmts proceed.   [name] RN, BSN, OCN Head & Neck Oncology Nurse [place] at [facility] Phone # [phone]  Fax # [phone]

## 2020-04-04 NOTE — Telephone Encounter (Signed)
Received a phone call from patient's Sister Vicente Males.  Patient wants to eat something today.  He had his feeding tube placed yesterday and was told to stay on clear liquids.  She also had many questions about reason for tube placement and formula prescribed.  Educated her that patient could resume a regular diet today per MD note.  Educated and addressed sister's questions regarding feeding tube formula and reason for placement.  RD has appointment with patient in 2 weeks and is available sooner to review tube feeding administration if he desires.  However, he refused quicker follow-up when he met with dietitian prior to tube placement.  He has had a feeding tube before and did not feel like he needed additional education.

## 2020-04-05 ENCOUNTER — Inpatient Hospital Stay: Payer: Medicaid Other | Attending: Hematology | Admitting: Hematology and Oncology

## 2020-04-05 ENCOUNTER — Inpatient Hospital Stay: Payer: Medicaid Other

## 2020-04-05 ENCOUNTER — Encounter: Payer: Self-pay | Admitting: Nutrition

## 2020-04-05 ENCOUNTER — Other Ambulatory Visit: Payer: Self-pay

## 2020-04-05 ENCOUNTER — Ambulatory Visit
Admission: RE | Admit: 2020-04-05 | Discharge: 2020-04-05 | Disposition: A | Discharge status: Home / Self Care | Payer: Medicaid Other | Source: Ambulatory Visit | Attending: Radiation Oncology | Admitting: Radiation Oncology

## 2020-04-05 DIAGNOSIS — Z5111 Encounter for antineoplastic chemotherapy: Secondary | ICD-10-CM | POA: Diagnosis not present

## 2020-04-05 DIAGNOSIS — C049 Malignant neoplasm of floor of mouth, unspecified: Secondary | ICD-10-CM | POA: Insufficient documentation

## 2020-04-05 DIAGNOSIS — C770 Secondary and unspecified malignant neoplasm of lymph nodes of head, face and neck: Secondary | ICD-10-CM | POA: Diagnosis not present

## 2020-04-05 DIAGNOSIS — C023 Malignant neoplasm of anterior two-thirds of tongue, part unspecified: Secondary | ICD-10-CM | POA: Diagnosis not present

## 2020-04-05 DIAGNOSIS — Z452 Encounter for adjustment and management of vascular access device: Secondary | ICD-10-CM | POA: Insufficient documentation

## 2020-04-05 DIAGNOSIS — Z23 Encounter for immunization: Secondary | ICD-10-CM | POA: Diagnosis not present

## 2020-04-05 LAB — CBC WITH DIFFERENTIAL (CANCER CENTER ONLY)
Abs Immature Granulocytes: 0.06 10*3/uL (ref 0.00–0.07)
Basophils Absolute: 0.1 10*3/uL (ref 0.0–0.1)
Basophils Relative: 0 %
Eosinophils Absolute: 0.1 10*3/uL (ref 0.0–0.5)
Eosinophils Relative: 1 %
HCT: 34.2 % — ABNORMAL LOW (ref 39.0–52.0)
Hemoglobin: 11.9 g/dL — ABNORMAL LOW (ref 13.0–17.0)
Immature Granulocytes: 1 %
Lymphocytes Relative: 14 %
Lymphs Abs: 1.6 10*3/uL (ref 0.7–4.0)
MCH: 32.6 pg (ref 26.0–34.0)
MCHC: 34.8 g/dL (ref 30.0–36.0)
MCV: 93.7 fL (ref 80.0–100.0)
Monocytes Absolute: 0.9 10*3/uL (ref 0.1–1.0)
Monocytes Relative: 8 %
Neutro Abs: 8.6 10*3/uL — ABNORMAL HIGH (ref 1.7–7.7)
Neutrophils Relative %: 76 %
Platelet Count: 282 10*3/uL (ref 150–400)
RBC: 3.65 MIL/uL — ABNORMAL LOW (ref 4.22–5.81)
RDW: 13.3 % (ref 11.5–15.5)
WBC Count: 11.3 10*3/uL — ABNORMAL HIGH (ref 4.0–10.5)
nRBC: 0 % (ref 0.0–0.2)

## 2020-04-05 LAB — BASIC METABOLIC PANEL - CANCER CENTER ONLY
Anion gap: 7 (ref 5–15)
BUN: 8 mg/dL (ref 6–20)
CO2: 28 mmol/L (ref 22–32)
Calcium: 10.1 mg/dL (ref 8.9–10.3)
Chloride: 101 mmol/L (ref 98–111)
Creatinine: 0.71 mg/dL (ref 0.61–1.24)
GFR, Est AFR Am: >60 mL/min (ref >60)
GFR, Estimated: >60 mL/min (ref >60)
Glucose, Bld: 101 mg/dL — ABNORMAL HIGH (ref 70–99)
Potassium: 3.9 mmol/L (ref 3.5–5.1)
Sodium: 136 mmol/L (ref 135–145)

## 2020-04-05 MED ORDER — SODIUM CHLORIDE 0.9% FLUSH
10.0000 mL | INTRAVENOUS | Status: DC | PRN
  Administered 2020-04-05: 10 mL via INTRAVENOUS
  Filled 2020-04-05: qty 10

## 2020-04-05 MED ORDER — HEPARIN SOD (PORK) LOCK FLUSH 100 UNIT/ML IV SOLN
500.0000 [IU] | Freq: Once | INTRAVENOUS | Status: AC
  Administered 2020-04-05: 500 [IU] via INTRAVENOUS
  Filled 2020-04-05: qty 5

## 2020-04-05 NOTE — Assessment & Plan Note (Signed)
12/21/2019: In March 2021 he underwent dental etxractions and his dentists referred him for evaluation. 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  CT maxillofacial on 12/22/19 showed postsurgical changes and mandibular invasion, with cervical lymphadenopathy consistent with metastatic nodes.  MRI neck: [facility]: 12/23/2019: Floor of mouth mass 3.4 cm with involvement of genioglossus muscle and erosion of inner cortex of the right mandible, multiple necrotic level 1B lymph nodes largest 1.6 cm T4AN2B M0 stage IVa clinical stage  PET scan on 02/16/20 showed the tongue mass with lymph node metastases on the right floor of the mouth and cervical lymph nodes. CK7 and CD 117 are negative, p63 and CK 5/6 are positive, p16 is negative  Treatment plan: 1.  Concurrent chemoradiation with weekly cisplatin 2. PEG tube and port placement 3.  Patient will require IV fluids on a weekly basis --------------------------------------------------------------------------------------------------------------------------------------- Current treatment: Cycle 1 cisplatin Chemo education chemo consent completed Patient will come weekly for IV fluid hydration  Return to clinic in 1 week for toxicity check and follow-up

## 2020-04-06 ENCOUNTER — Other Ambulatory Visit: Payer: Self-pay

## 2020-04-06 ENCOUNTER — Inpatient Hospital Stay: Payer: Medicaid Other

## 2020-04-06 ENCOUNTER — Ambulatory Visit
Admission: RE | Admit: 2020-04-06 | Discharge: 2020-04-06 | Disposition: A | Discharge status: Home / Self Care | Payer: Medicaid Other | Source: Ambulatory Visit | Attending: Radiation Oncology | Admitting: Radiation Oncology

## 2020-04-06 ENCOUNTER — Telehealth: Payer: Self-pay | Admitting: Hematology and Oncology

## 2020-04-06 VITALS — BP 141/84 | HR 93 | Temp 98.8°F | Resp 17

## 2020-04-06 DIAGNOSIS — C770 Secondary and unspecified malignant neoplasm of lymph nodes of head, face and neck: Secondary | ICD-10-CM | POA: Diagnosis not present

## 2020-04-06 DIAGNOSIS — Z23 Encounter for immunization: Secondary | ICD-10-CM | POA: Diagnosis not present

## 2020-04-06 DIAGNOSIS — Z452 Encounter for adjustment and management of vascular access device: Secondary | ICD-10-CM | POA: Diagnosis not present

## 2020-04-06 DIAGNOSIS — C049 Malignant neoplasm of floor of mouth, unspecified: Secondary | ICD-10-CM

## 2020-04-06 DIAGNOSIS — Z5111 Encounter for antineoplastic chemotherapy: Secondary | ICD-10-CM | POA: Diagnosis not present

## 2020-04-06 DIAGNOSIS — C023 Malignant neoplasm of anterior two-thirds of tongue, part unspecified: Secondary | ICD-10-CM | POA: Diagnosis not present

## 2020-04-06 MED ORDER — SODIUM CHLORIDE 0.9 % IV SOLN
100.0000 mg/m2 | Freq: Once | INTRAVENOUS | Status: AC
  Administered 2020-04-06: 165 mg via INTRAVENOUS
  Filled 2020-04-06: qty 165

## 2020-04-06 MED ORDER — PALONOSETRON HCL INJECTION 0.25 MG/5ML
0.2500 mg | Freq: Once | INTRAVENOUS | Status: AC
  Administered 2020-04-06: 0.25 mg via INTRAVENOUS

## 2020-04-06 MED ORDER — HEPARIN SOD (PORK) LOCK FLUSH 100 UNIT/ML IV SOLN
500.0000 [IU] | Freq: Once | INTRAVENOUS | Status: AC | PRN
  Administered 2020-04-06: 500 [IU]
  Filled 2020-04-06: qty 5

## 2020-04-06 MED ORDER — SODIUM CHLORIDE 0.9 % IV SOLN
150.0000 mg | Freq: Once | INTRAVENOUS | Status: AC
  Administered 2020-04-06: 150 mg via INTRAVENOUS
  Filled 2020-04-06: qty 150

## 2020-04-06 MED ORDER — SODIUM CHLORIDE 0.9 % IV SOLN
10.0000 mg | Freq: Once | INTRAVENOUS | Status: AC
  Administered 2020-04-06: 10 mg via INTRAVENOUS
  Filled 2020-04-06: qty 10

## 2020-04-06 MED ORDER — SODIUM CHLORIDE 0.9% FLUSH
10.0000 mL | INTRAVENOUS | Status: DC | PRN
  Administered 2020-04-06: 10 mL
  Filled 2020-04-06: qty 10

## 2020-04-06 MED ORDER — PALONOSETRON HCL INJECTION 0.25 MG/5ML
INTRAVENOUS | Status: AC
  Filled 2020-04-06: qty 5

## 2020-04-06 MED ORDER — SODIUM CHLORIDE 0.9 % IV SOLN
Freq: Once | INTRAVENOUS | Status: AC
  Filled 2020-04-06: qty 10

## 2020-04-06 NOTE — Patient Instructions (Signed)
[place] Discharge Instructions for Patients Receiving Chemotherapy  Today you received the following chemotherapy agents: Cisplatin  To help prevent nausea and vomiting after your treatment, we encourage you to take your nausea medication as directed.   If you develop nausea and vomiting that is not controlled by your nausea medication, call the clinic.   BELOW ARE SYMPTOMS THAT SHOULD BE REPORTED IMMEDIATELY:  *FEVER GREATER THAN 100.5 F  *CHILLS WITH OR WITHOUT FEVER  NAUSEA AND VOMITING THAT IS NOT CONTROLLED WITH YOUR NAUSEA MEDICATION  *UNUSUAL SHORTNESS OF BREATH  *UNUSUAL BRUISING OR BLEEDING  TENDERNESS IN MOUTH AND THROAT WITH OR WITHOUT PRESENCE OF ULCERS  *URINARY PROBLEMS  *BOWEL PROBLEMS  UNUSUAL RASH Items with * indicate a potential emergency and should be followed up as soon as possible.  Feel free to call the clinic should you have any questions or concerns. The clinic phone number is [phone].  Please show the [place] at check-in to the Emergency Department and triage nurse.  Cisplatin injection What is this medicine? CISPLATIN (SIS pla tin) is a chemotherapy drug. It targets fast dividing cells, like cancer cells, and causes these cells to die. This medicine is used to treat many types of cancer like bladder, ovarian, and testicular cancers. This medicine may be used for other purposes; ask your health care provider or pharmacist if you have questions. COMMON BRAND NAME(S): Platinol, Platinol -AQ What should I tell my health care provider before I take this medicine? They need to know if you have any of these conditions:  eye disease, vision problems  hearing problems  kidney disease  low blood counts, like white cells, platelets, or red blood cells  tingling of the fingers or toes, or other nerve disorder  an unusual or allergic reaction to cisplatin, carboplatin, oxaliplatin, other medicines, foods, dyes, or  preservatives  pregnant or trying to get pregnant  breast-feeding How should I use this medicine? This drug is given as an infusion into a vein. It is administered in a hospital or clinic by a specially trained health care professional. Talk to your pediatrician regarding the use of this medicine in children. Special care may be needed. Overdosage: If you think you have taken too much of this medicine contact a poison control center or emergency room at once. NOTE: This medicine is only for you. Do not share this medicine with others. What if I miss a dose? It is important not to miss a dose. Call your doctor or health care professional if you are unable to keep an appointment. What may interact with this medicine? This medicine may interact with the following medications:  foscarnet  certain antibiotics like amikacin, gentamicin, neomycin, polymyxin B, streptomycin, tobramycin, vancomycin This list may not describe all possible interactions. Give your health care provider a list of all the medicines, herbs, non-prescription drugs, or dietary supplements you use. Also tell them if you smoke, drink alcohol, or use illegal drugs. Some items may interact with your medicine. What should I watch for while using this medicine? Your condition will be monitored carefully while you are receiving this medicine. You will need important blood work done while you are taking this medicine. This drug may make you feel generally unwell. This is not uncommon, as chemotherapy can affect healthy cells as well as cancer cells. Report any side effects. Continue your course of treatment even though you feel ill unless your doctor tells you to stop. This medicine may increase your  risk of getting an infection. Call your healthcare professional for advice if you get a fever, chills, or sore throat, or other symptoms of a cold or flu. Do not treat yourself. Try to avoid being around people who are sick. Avoid taking  medicines that contain aspirin, acetaminophen, ibuprofen, naproxen, or ketoprofen unless instructed by your healthcare professional. These medicines may hide a fever. This medicine may increase your risk to bruise or bleed. Call your doctor or health care professional if you notice any unusual bleeding. Be careful brushing and flossing your teeth or using a toothpick because you may get an infection or bleed more easily. If you have any dental work done, tell your dentist you are receiving this medicine. Do not become pregnant while taking this medicine or for 14 months after stopping it. Women should inform their healthcare professional if they wish to become pregnant or think they might be pregnant. Men should not father a child while taking this medicine and for 11 months after stopping it. There is potential for serious side effects to an unborn child. Talk to your healthcare professional for more information. Do not breast-feed an infant while taking this medicine. This medicine has caused ovarian failure in some women. This medicine may make it more difficult to get pregnant. Talk to your healthcare professional if you are concerned about your fertility. This medicine has caused decreased sperm counts in some men. This may make it more difficult to father a child. Talk to your healthcare professional if you are concerned about your fertility. Drink fluids as directed while you are taking this medicine. This will help protect your kidneys. Call your doctor or health care professional if you get diarrhea. Do not treat yourself. What side effects may I notice from receiving this medicine? Side effects that you should report to your doctor or health care professional as soon as possible:  allergic reactions like skin rash, itching or hives, swelling of the face, lips, or tongue  blurred vision  changes in vision  decreased hearing or ringing of the ears  nausea, vomiting  pain, redness, or  irritation at site where injected  pain, tingling, numbness in the hands or feet  signs and symptoms of bleeding such as bloody or black, tarry stools; red or dark brown urine; spitting up blood or brown material that looks like coffee grounds; red spots on the skin; unusual bruising or bleeding from the eyes, gums, or nose  signs and symptoms of infection like fever; chills; cough; sore throat; pain or trouble passing urine  signs and symptoms of kidney injury like trouble passing urine or change in the amount of urine  signs and symptoms of low red blood cells or anemia such as unusually weak or tired; feeling faint or lightheaded; falls; breathing problems Side effects that usually do not require medical attention (report to your doctor or health care professional if they continue or are bothersome):  loss of appetite  mouth sores  muscle cramps This list may not describe all possible side effects. Call your doctor for medical advice about side effects. You may report side effects to FDA at 1-800-FDA-1088. Where should I keep my medicine? This drug is given in a hospital or clinic and will not be stored at home. NOTE: This sheet is a summary. It may not cover all possible information. If you have questions about this medicine, talk to your doctor, pharmacist, or health care provider.  2020 Elsevier/Gold Standard (2018-07-16 15:59:17)

## 2020-04-06 NOTE — Telephone Encounter (Signed)
No 9/2 los, no changes made to pt schedule

## 2020-04-10 ENCOUNTER — Telehealth: Payer: Self-pay | Admitting: *Deleted

## 2020-04-10 ENCOUNTER — Ambulatory Visit
Admission: RE | Admit: 2020-04-10 | Discharge: 2020-04-10 | Disposition: A | Discharge status: Home / Self Care | Payer: Medicaid Other | Source: Ambulatory Visit | Attending: Radiation Oncology | Admitting: Radiation Oncology

## 2020-04-10 DIAGNOSIS — C023 Malignant neoplasm of anterior two-thirds of tongue, part unspecified: Secondary | ICD-10-CM | POA: Diagnosis not present

## 2020-04-10 DIAGNOSIS — C049 Malignant neoplasm of floor of mouth, unspecified: Secondary | ICD-10-CM | POA: Diagnosis not present

## 2020-04-10 MED ORDER — SONAFINE EX EMUL
1.0000 "application " | Freq: Two times a day (BID) | CUTANEOUS | Status: DC
  Administered 2020-04-10: 1 via TOPICAL

## 2020-04-10 NOTE — Progress Notes (Signed)

## 2020-04-11 ENCOUNTER — Other Ambulatory Visit: Payer: Self-pay

## 2020-04-11 ENCOUNTER — Ambulatory Visit
Admission: RE | Admit: 2020-04-11 | Discharge: 2020-04-11 | Disposition: A | Discharge status: Home / Self Care | Payer: Medicaid Other | Source: Ambulatory Visit | Attending: Radiation Oncology | Admitting: Radiation Oncology

## 2020-04-11 DIAGNOSIS — C049 Malignant neoplasm of floor of mouth, unspecified: Secondary | ICD-10-CM | POA: Diagnosis not present

## 2020-04-11 DIAGNOSIS — C023 Malignant neoplasm of anterior two-thirds of tongue, part unspecified: Secondary | ICD-10-CM | POA: Diagnosis not present

## 2020-04-11 NOTE — Progress Notes (Signed)
Oncology Nurse Navigator Documentation  I met with Casey [name] during his weekly visit with [name] yesterday. He reported continued yellow drainage from his PEG site. A moderate amount of yellow/brown drainage was noted by me and the area cleaned and a new dressing was placed. There were no sign/symptoms of infection present. I spoke with Interventional Radiology and plans were made to meet with Casey [name] today after his radiation treatment. Cathy from IR assessed the site today, provided education to Casey [name] regarding his PEG tube and the retention ring. Casey [name] is aware to watch the site, change the dressing as needed and was reassured that the PEG site appears normal without infection. He knows to call me if he has any further questions or concerns.   [name] RN, BSN, OCN Head & Neck Oncology Nurse [place] at [facility] Phone # [phone]  Fax # [phone]

## 2020-04-11 NOTE — Progress Notes (Signed)
Patient Care Team: System, Pcp Not In as PCP - General Malmfelt, Stephani Police, RN as Oncology Nurse Navigator Eppie Gibson, MD as Attending Physician (Radiation Oncology) Nicholas Lose, MD as Consulting Physician (Hematology and Oncology)  DIAGNOSIS:    ICD-10-CM   1. 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 CT maxillofacial on 12/22/19 showed postsurgical changes and mandibular invasion, with cervical lymphadenopathy consistent with metastatic nodes. PET scan on 02/16/20 showed the tongue mass with lymph node metastases on the right floor of the mouth and cervical lymph nodes.   03/07/2020 Cancer Staging   Staging form: Oral Cavity, AJCC 8th Edition - Clinical: Stage IVA (cT4a, cN2b, cM0) - Signed by Nicholas Lose, MD on 03/07/2020   04/06/2020 -  Chemotherapy   The patient had dexamethasone (DECADRON) 4 MG tablet, 8 mg, Oral, Daily, 1 of 1 cycle, Start date: 03/07/2020, End date: -- palonosetron (ALOXI) injection 0.25 mg, 0.25 mg, Intravenous,  Once, 1 of 3 cycles Administration: 0.25 mg (04/06/2020) CISplatin (PLATINOL) 165 mg in sodium chloride 0.9 % 500 mL chemo infusion, 100 mg/m2 = 165 mg, Intravenous,  Once, 1 of 3 cycles Administration: 165 mg (04/06/2020) fosaprepitant (EMEND) 150 mg in sodium chloride 0.9 % 145 mL IVPB, 150 mg, Intravenous,  Once, 1 of 3 cycles Administration: 150 mg (04/06/2020)  for chemotherapy treatment.      CHIEF COMPLIANT: Follow-up of chemoradiation with cisplatin  INTERVAL HISTORY: Casey [name] is a 54 y.o. with above-mentioned history of floor of the mouth cancer. He is currently on concurrent  chemoradiation treatment with every 3 week cisplatin. He presents to the clinic today for a toxicity check following cycle 1.  His major complaints today are soreness and swelling of the tongue.  Also loss of taste.  Because of this is not able to take a lot of food or fluids.  He is trying his best.  He denies any nausea vomiting from chemotherapy.  In fact tolerated it extremely well.  ALLERGIES:  has No Known Allergies.  MEDICATIONS:  Current Outpatient Medications  Medication Sig Dispense Refill  . dexamethasone (DECADRON) 4 MG tablet Take 2 tablets (8 mg total) by mouth daily. Take daily for 3 days after chemo. Take with food. (Patient not taking: Reported on 03/20/2020) 30 tablet 1  . ibuprofen (ADVIL) 200 MG tablet Take 600 mg by mouth every 6 (six) hours as needed for moderate pain.    Marland Kitchen lidocaine (XYLOCAINE) 2 % solution Patient: Mix 1part 2% viscous lidocaine, 1part H20. Swish/spit 45mL of diluted mixture to numb mouth, q 2hrs PRN. 200 mL 4  . lidocaine-prilocaine (EMLA) cream Apply to affected area once (Patient not taking: Reported on 03/20/2020) 30 g 3  . LORazepam (ATIVAN) 0.5 MG tablet Take 1 tablet (0.5 mg total) by mouth at bedtime as needed (Nausea or vomiting). (Patient not taking: Reported on 03/20/2020) 30 tablet 1  . Multiple Vitamin (MULTIVITAMIN) tablet Take 1 tablet by mouth daily.    . ondansetron (ZOFRAN) 8 MG tablet Take 1 tablet (8 mg total) by mouth 2 (two) times daily as needed. Start on the third day after chemotherapy. (Patient not taking: Reported on  03/20/2020) 30 tablet 1  . oxyCODONE-acetaminophen (PERCOCET) 5-325 MG tablet Take 1 tablet by mouth every 6 (six) hours as needed for severe pain. 90 tablet 0  . prochlorperazine (COMPAZINE) 10 MG tablet Take 1 tablet (10 mg total) by mouth every 6 (six) hours as needed (Nausea or vomiting). (Patient not taking: Reported on 03/20/2020) 30 tablet 1   No current facility-administered medications for this visit.    PHYSICAL  EXAMINATION: ECOG PERFORMANCE STATUS: 1 - Symptomatic but completely ambulatory  Vitals:   04/12/20 0951  BP: (!) 140/92  Pulse: 73  Resp: 19  Temp: (!) 96.4 F (35.8 C)  SpO2: 100%   Filed Weights   04/12/20 0951  Weight: 133 lb (60.3 kg)    LABORATORY DATA:  I have reviewed the data as listed CMP Latest Ref Rng & Units 04/12/2020 04/05/2020 04/03/2020  Glucose 70 - 99 mg/dL 119(H) 101(H) 107(H)  BUN 6 - 20 mg/dL 33(H) 8 9  Creatinine 0.61 - 1.24 mg/dL 1.27(H) 0.71 0.76  Sodium 135 - 145 mmol/L 131(L) 136 136  Potassium 3.5 - 5.1 mmol/L 4.1 3.9 4.9  Chloride 98 - 111 mmol/L 96(L) 101 98  CO2 22 - 32 mmol/L 25 28 26   Calcium 8.9 - 10.3 mg/dL 9.6 10.1 9.4  Total Protein 6.5 - 8.1 g/dL - - 8.1  Total Bilirubin 0.3 - 1.2 mg/dL - - 1.0  Alkaline Phos 38 - 126 U/L - - 65  AST 15 - 41 U/L - - 33  ALT 0 - 44 U/L - - 25    Lab Results  Component Value Date   WBC 11.2 (H) 04/12/2020   HGB 11.4 (L) 04/12/2020   HCT 32.4 (L) 04/12/2020   MCV 91.5 04/12/2020   PLT 380 04/12/2020   NEUTROABS 8.5 (H) 04/12/2020    ASSESSMENT & PLAN:  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  CT maxillofacial on 12/22/19 showed postsurgical changes and mandibular invasion, with cervical lymphadenopathy consistent with metastatic nodes. MRI neck: [facility]: 12/23/2019: Floor of mouth mass 3.4 cm with involvement of genioglossus muscle and erosion of inner cortex of the right mandible, multiple necrotic level 1B lymph nodes largest 1.6 cm T4AN2B M0 stage IVa clinical stage  PET scan on 02/16/20 showed the tongue mass with lymph node metastases on the right floor of the mouth and cervical lymph nodes. CK7 and CD 117 are negative, p63 and CK 5/6 are positive, p16 is  negative  Treatment plan: 1.Concurrent chemoradiation with cisplatin every 3 weeks x3 2.PEG tube and port placement 3.Patient will require IV fluids on a weekly basis --------------------------------------------------------------------------------------------------------------------------------------- Current treatment: Cycle 1 cisplatin started 04/06/2020 with XRT Patient will come weekly for IV fluid hydration  Toxicities: Denies any nausea or vomiting. Loss of taste and appetite Decreased oral intake of fluids: We will give 1 L of normal saline today.  Return to clinic weekly for IV fluids and in 2 weeks for cycle 2 of cisplatin.    No orders of the defined types were placed in this encounter.  The patient has a good understanding of the overall plan. he agrees with it. he will call with any problems that may develop before the next visit here.  Total time spent: 30 mins including face to face time and time spent for planning,  charting and coordination of care  Nicholas Lose, MD 04/12/2020  Julious Oka Dorshimer, am acting as scribe for Dr. Nicholas Lose.  I have reviewed the above documentation for accuracy and completeness, and I agree with the above.

## 2020-04-12 ENCOUNTER — Inpatient Hospital Stay (HOSPITAL_BASED_OUTPATIENT_CLINIC_OR_DEPARTMENT_OTHER): Payer: Medicaid Other | Admitting: Hematology and Oncology

## 2020-04-12 ENCOUNTER — Ambulatory Visit
Admission: RE | Admit: 2020-04-12 | Discharge: 2020-04-12 | Disposition: A | Discharge status: Home / Self Care | Payer: Medicaid Other | Source: Ambulatory Visit | Attending: Radiation Oncology | Admitting: Radiation Oncology

## 2020-04-12 ENCOUNTER — Inpatient Hospital Stay: Payer: Medicaid Other | Admitting: Nutrition

## 2020-04-12 ENCOUNTER — Other Ambulatory Visit: Payer: Self-pay | Admitting: *Deleted

## 2020-04-12 ENCOUNTER — Inpatient Hospital Stay: Payer: Medicaid Other

## 2020-04-12 ENCOUNTER — Other Ambulatory Visit: Payer: Self-pay

## 2020-04-12 DIAGNOSIS — C049 Malignant neoplasm of floor of mouth, unspecified: Secondary | ICD-10-CM

## 2020-04-12 DIAGNOSIS — C770 Secondary and unspecified malignant neoplasm of lymph nodes of head, face and neck: Secondary | ICD-10-CM | POA: Diagnosis not present

## 2020-04-12 DIAGNOSIS — Z23 Encounter for immunization: Secondary | ICD-10-CM | POA: Diagnosis not present

## 2020-04-12 DIAGNOSIS — Z452 Encounter for adjustment and management of vascular access device: Secondary | ICD-10-CM | POA: Diagnosis not present

## 2020-04-12 DIAGNOSIS — Z5111 Encounter for antineoplastic chemotherapy: Secondary | ICD-10-CM | POA: Diagnosis not present

## 2020-04-12 DIAGNOSIS — C023 Malignant neoplasm of anterior two-thirds of tongue, part unspecified: Secondary | ICD-10-CM | POA: Diagnosis not present

## 2020-04-12 LAB — CBC WITH DIFFERENTIAL (CANCER CENTER ONLY)
Abs Immature Granulocytes: 0.09 10*3/uL — ABNORMAL HIGH (ref 0.00–0.07)
Basophils Absolute: 0 10*3/uL (ref 0.0–0.1)
Basophils Relative: 0 %
Eosinophils Absolute: 0 10*3/uL (ref 0.0–0.5)
Eosinophils Relative: 0 %
HCT: 32.4 % — ABNORMAL LOW (ref 39.0–52.0)
Hemoglobin: 11.4 g/dL — ABNORMAL LOW (ref 13.0–17.0)
Immature Granulocytes: 1 %
Lymphocytes Relative: 13 %
Lymphs Abs: 1.5 10*3/uL (ref 0.7–4.0)
MCH: 32.2 pg (ref 26.0–34.0)
MCHC: 35.2 g/dL (ref 30.0–36.0)
MCV: 91.5 fL (ref 80.0–100.0)
Monocytes Absolute: 1 10*3/uL (ref 0.1–1.0)
Monocytes Relative: 9 %
Neutro Abs: 8.5 10*3/uL — ABNORMAL HIGH (ref 1.7–7.7)
Neutrophils Relative %: 77 %
Platelet Count: 380 10*3/uL (ref 150–400)
RBC: 3.54 MIL/uL — ABNORMAL LOW (ref 4.22–5.81)
RDW: 12.5 % (ref 11.5–15.5)
WBC Count: 11.2 10*3/uL — ABNORMAL HIGH (ref 4.0–10.5)
nRBC: 0 % (ref 0.0–0.2)

## 2020-04-12 LAB — BASIC METABOLIC PANEL - CANCER CENTER ONLY
Anion gap: 10 (ref 5–15)
BUN: 33 mg/dL — ABNORMAL HIGH (ref 6–20)
CO2: 25 mmol/L (ref 22–32)
Calcium: 9.6 mg/dL (ref 8.9–10.3)
Chloride: 96 mmol/L — ABNORMAL LOW (ref 98–111)
Creatinine: 1.27 mg/dL — ABNORMAL HIGH (ref 0.61–1.24)
GFR, Est AFR Am: >60 mL/min (ref >60)
GFR, Estimated: >60 mL/min (ref >60)
Glucose, Bld: 119 mg/dL — ABNORMAL HIGH (ref 70–99)
Potassium: 4.1 mmol/L (ref 3.5–5.1)
Sodium: 131 mmol/L — ABNORMAL LOW (ref 135–145)

## 2020-04-12 MED ORDER — SODIUM CHLORIDE 0.9 % IV SOLN
Freq: Once | INTRAVENOUS | Status: AC
  Filled 2020-04-12: qty 1000

## 2020-04-12 MED ORDER — SODIUM CHLORIDE 0.9% FLUSH
10.0000 mL | Freq: Once | INTRAVENOUS | Status: AC
  Administered 2020-04-12: 10 mL via INTRAVENOUS
  Filled 2020-04-12: qty 10

## 2020-04-12 MED ORDER — HEPARIN SOD (PORK) LOCK FLUSH 100 UNIT/ML IV SOLN
500.0000 [IU] | Freq: Once | INTRAVENOUS | Status: AC
  Administered 2020-04-12: 500 [IU] via INTRAVENOUS
  Filled 2020-04-12: qty 5

## 2020-04-12 NOTE — Assessment & Plan Note (Signed)
12/21/2019:In March 2021 he underwent dental etxractions and his dentists referred him for evaluation. 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  CT maxillofacial on 12/22/19 showed postsurgical changes and mandibular invasion, with cervical lymphadenopathy consistent with metastatic nodes. MRI neck: [facility]: 12/23/2019: Floor of mouth mass 3.4 cm with involvement of genioglossus muscle and erosion of inner cortex of the right mandible, multiple necrotic level 1B lymph nodes largest 1.6 cm T4AN2B M0 stage IVa clinical stage  PET scan on 02/16/20 showed the tongue mass with lymph node metastases on the right floor of the mouth and cervical lymph nodes. CK7 and CD 117 are negative, p63 and CK 5/6 are positive, p16 is negative  Treatment plan: 1.Concurrent chemoradiation with weekly cisplatin 2.PEG tube and port placement 3.Patient will require IV fluids on a weekly basis --------------------------------------------------------------------------------------------------------------------------------------- Current treatment: Cycle 1 cisplatin started 04/06/2020 with XRT Patient will come weekly for IV fluid hydration  Toxicities:  Return to clinic weekly for follow-up

## 2020-04-12 NOTE — Patient Instructions (Signed)

## 2020-04-12 NOTE — Progress Notes (Signed)
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 Patient is receiving concurrent chemoradiation therapy and is status post PEG and port placement. Weight was documented as 133 pounds on September 9 stable from 133.4 pounds August 4. Noted labs: Sodium 131, glucose 119, BUN 33, and creatinine 1.27. Patient was told to drink more water.  He is not sure he can increase water intake. Dietary recall reveals patient is tolerating soft foods with good variety. He is eating oatmeal, macaroni and cheese, mashed potatoes, sloppy Joe's, tuna casserole, and Carnation breakfast. Patient is flushing his feeding tube daily and cleaning around it once a day. Reports taste alterations but denies nausea, vomiting, constipation, and diarrhea.  Nutrition diagnosis: Predicted suboptimal energy intake continues.  Estimated energy needs.  1800-2100 cal, 90-105 g protein, 1.8 L fluid.  Intervention: Reviewed importance of continuing increased oral intake consisting of high-calorie, high-protein foods. Recommended patient try powdered milk mixed with whole milk to provide additional calories and protein. Encourage patient to flush his feeding tube with an additional 240 mL free water halfway between meals.  Monitoring, evaluation, goals: Patient will work to increase calories protein and fluids to minimize weight loss and minimize dehydration.  Next visit: Wednesday, September 15 for radiation therapy.  **Disclaimer: This note was dictated with voice recognition software. Similar sounding words can inadvertently be transcribed and this note may contain transcription errors which may not have been corrected upon publication of note.**

## 2020-04-13 ENCOUNTER — Telehealth: Payer: Self-pay | Admitting: Hematology and Oncology

## 2020-04-13 ENCOUNTER — Other Ambulatory Visit: Payer: Self-pay

## 2020-04-13 ENCOUNTER — Ambulatory Visit
Admission: RE | Admit: 2020-04-13 | Discharge: 2020-04-13 | Disposition: A | Discharge status: Home / Self Care | Payer: Medicaid Other | Source: Ambulatory Visit | Attending: Radiation Oncology | Admitting: Radiation Oncology

## 2020-04-13 DIAGNOSIS — C023 Malignant neoplasm of anterior two-thirds of tongue, part unspecified: Secondary | ICD-10-CM | POA: Diagnosis not present

## 2020-04-13 DIAGNOSIS — C049 Malignant neoplasm of floor of mouth, unspecified: Secondary | ICD-10-CM | POA: Diagnosis not present

## 2020-04-13 NOTE — Telephone Encounter (Signed)
Scheduled appts per 9/9 los. Pt to get updated appt calendar at next visit per appt notes.

## 2020-04-15 ENCOUNTER — Other Ambulatory Visit: Payer: Self-pay

## 2020-04-16 ENCOUNTER — Other Ambulatory Visit: Payer: Self-pay

## 2020-04-16 ENCOUNTER — Ambulatory Visit
Admission: RE | Admit: 2020-04-16 | Discharge: 2020-04-16 | Disposition: A | Discharge status: Home / Self Care | Payer: Medicaid Other | Source: Ambulatory Visit | Attending: Radiation Oncology | Admitting: Radiation Oncology

## 2020-04-16 DIAGNOSIS — C049 Malignant neoplasm of floor of mouth, unspecified: Secondary | ICD-10-CM | POA: Diagnosis not present

## 2020-04-16 DIAGNOSIS — C023 Malignant neoplasm of anterior two-thirds of tongue, part unspecified: Secondary | ICD-10-CM | POA: Diagnosis not present

## 2020-04-17 ENCOUNTER — Ambulatory Visit
Admission: RE | Admit: 2020-04-17 | Discharge: 2020-04-17 | Disposition: A | Discharge status: Home / Self Care | Payer: Medicaid Other | Source: Ambulatory Visit | Attending: Radiation Oncology | Admitting: Radiation Oncology

## 2020-04-17 ENCOUNTER — Other Ambulatory Visit: Payer: Self-pay

## 2020-04-17 DIAGNOSIS — C023 Malignant neoplasm of anterior two-thirds of tongue, part unspecified: Secondary | ICD-10-CM | POA: Diagnosis not present

## 2020-04-17 DIAGNOSIS — C049 Malignant neoplasm of floor of mouth, unspecified: Secondary | ICD-10-CM | POA: Diagnosis not present

## 2020-04-18 ENCOUNTER — Inpatient Hospital Stay: Payer: Medicaid Other

## 2020-04-18 ENCOUNTER — Other Ambulatory Visit: Payer: Self-pay

## 2020-04-18 ENCOUNTER — Inpatient Hospital Stay: Payer: Medicaid Other | Admitting: Nutrition

## 2020-04-18 ENCOUNTER — Ambulatory Visit
Admission: RE | Admit: 2020-04-18 | Discharge: 2020-04-18 | Disposition: A | Discharge status: Home / Self Care | Payer: Medicaid Other | Source: Ambulatory Visit | Attending: Radiation Oncology | Admitting: Radiation Oncology

## 2020-04-18 DIAGNOSIS — C023 Malignant neoplasm of anterior two-thirds of tongue, part unspecified: Secondary | ICD-10-CM | POA: Diagnosis not present

## 2020-04-18 DIAGNOSIS — C049 Malignant neoplasm of floor of mouth, unspecified: Secondary | ICD-10-CM | POA: Diagnosis not present

## 2020-04-18 DIAGNOSIS — Z23 Encounter for immunization: Secondary | ICD-10-CM

## 2020-04-18 NOTE — Progress Notes (Signed)
Nutrition follow-up completed with patient prior to radiation for floor of mouth and anterior tongue cancer. Weight improved slightly and was documented as 134.2 pounds on September 13. Patient reports decreased taste and thickened saliva. He can still taste some flavors but is forcing himself to eat other foods even if they do not have a taste. He has not been using his feeding tube yet. Continues baking soda and salt water rinses for thickened saliva. He understands he can use additional free water through his feeding tube if he is struggling with fluid intake.  Nutrition diagnosis: Predicted suboptimal energy intake continues.  Estimated energy needs: 1800-2100 cal, 90-105 g protein, 1.8 L fluid.  Intervention: Educated patient to continue strategies for increased calories and protein. Encouraged weight maintenance. Continue baking soda and salt water rinses as well as increased fluid intake. Try oral nutrition supplements.  Provided sample of strawberry Ensure Enlive. Add 240 mL free water twice daily via feeding tube if needed. Will schedule tube feeding demonstration with patient and sister next Thursday when he is here for chemotherapy.  He would like his sister to be present.  Monitoring, evaluation, goals: Patient will tolerate increased calories and protein to minimize weight loss.  Next visit: Thursday, September 23 during infusion.  **Disclaimer: This note was dictated with voice recognition software. Similar sounding words can inadvertently be transcribed and this note may contain transcription errors which may not have been corrected upon publication of note.**

## 2020-04-18 NOTE — Progress Notes (Signed)
   Covid-19 Vaccination Clinic  Name:  Casey [name]    MRN: [id number] DOB: May 01, 1966  04/18/2020  Casey [name] was observed post Covid-19 immunization for 15 minutes without incident. He was provided with Vaccine Information Sheet and instruction to access the V-Safe system.   Casey [name] was instructed to call 911 with any severe reactions post vaccine: Marland Kitchen Difficulty breathing  . Swelling of face and throat  . A fast heartbeat  . A bad rash all over body  . Dizziness and weakness   Immunizations Administered    Name Date Dose VIS Date Route   Pfizer COVID-19 Vaccine 04/18/2020 10:22 AM 0.3 mL 09/28/2018 Intramuscular   Manufacturer: [place]   Lot: [id number]   [place]: [id number]

## 2020-04-19 ENCOUNTER — Ambulatory Visit: Payer: Medicaid Other | Attending: Radiation Oncology

## 2020-04-19 ENCOUNTER — Ambulatory Visit: Payer: Medicaid Other | Admitting: Physical Therapy

## 2020-04-19 ENCOUNTER — Encounter: Payer: Self-pay | Admitting: Physical Therapy

## 2020-04-19 ENCOUNTER — Inpatient Hospital Stay: Payer: Medicaid Other

## 2020-04-19 ENCOUNTER — Ambulatory Visit
Admission: RE | Admit: 2020-04-19 | Discharge: 2020-04-19 | Disposition: A | Discharge status: Home / Self Care | Payer: Medicaid Other | Source: Ambulatory Visit | Attending: Radiation Oncology | Admitting: Radiation Oncology

## 2020-04-19 ENCOUNTER — Other Ambulatory Visit: Payer: Self-pay

## 2020-04-19 ENCOUNTER — Encounter: Payer: Self-pay | Admitting: General Practice

## 2020-04-19 VITALS — BP 144/87 | HR 68 | Temp 99.0°F | Resp 18

## 2020-04-19 DIAGNOSIS — C023 Malignant neoplasm of anterior two-thirds of tongue, part unspecified: Secondary | ICD-10-CM | POA: Diagnosis not present

## 2020-04-19 DIAGNOSIS — C049 Malignant neoplasm of floor of mouth, unspecified: Secondary | ICD-10-CM

## 2020-04-19 DIAGNOSIS — Z452 Encounter for adjustment and management of vascular access device: Secondary | ICD-10-CM | POA: Diagnosis not present

## 2020-04-19 DIAGNOSIS — R1312 Dysphagia, oropharyngeal phase: Secondary | ICD-10-CM | POA: Insufficient documentation

## 2020-04-19 DIAGNOSIS — Z23 Encounter for immunization: Secondary | ICD-10-CM | POA: Diagnosis not present

## 2020-04-19 DIAGNOSIS — R293 Abnormal posture: Secondary | ICD-10-CM

## 2020-04-19 DIAGNOSIS — Z95828 Presence of other vascular implants and grafts: Secondary | ICD-10-CM

## 2020-04-19 DIAGNOSIS — Z5111 Encounter for antineoplastic chemotherapy: Secondary | ICD-10-CM | POA: Diagnosis not present

## 2020-04-19 DIAGNOSIS — C770 Secondary and unspecified malignant neoplasm of lymph nodes of head, face and neck: Secondary | ICD-10-CM | POA: Diagnosis not present

## 2020-04-19 LAB — BASIC METABOLIC PANEL - CANCER CENTER ONLY
Anion gap: 11 (ref 5–15)
BUN: 12 mg/dL (ref 6–20)
CO2: 25 mmol/L (ref 22–32)
Calcium: 9.4 mg/dL (ref 8.9–10.3)
Chloride: 97 mmol/L — ABNORMAL LOW (ref 98–111)
Creatinine: 0.84 mg/dL (ref 0.61–1.24)
GFR, Est AFR Am: >60 mL/min (ref >60)
GFR, Estimated: >60 mL/min (ref >60)
Glucose, Bld: 120 mg/dL — ABNORMAL HIGH (ref 70–99)
Potassium: 4.3 mmol/L (ref 3.5–5.1)
Sodium: 133 mmol/L — ABNORMAL LOW (ref 135–145)

## 2020-04-19 LAB — CBC WITH DIFFERENTIAL (CANCER CENTER ONLY)
Abs Immature Granulocytes: 0.02 10*3/uL (ref 0.00–0.07)
Basophils Absolute: 0 10*3/uL (ref 0.0–0.1)
Basophils Relative: 1 %
Eosinophils Absolute: 0 10*3/uL (ref 0.0–0.5)
Eosinophils Relative: 0 %
HCT: 29.6 % — ABNORMAL LOW (ref 39.0–52.0)
Hemoglobin: 10.4 g/dL — ABNORMAL LOW (ref 13.0–17.0)
Immature Granulocytes: 0 %
Lymphocytes Relative: 11 %
Lymphs Abs: 0.6 10*3/uL — ABNORMAL LOW (ref 0.7–4.0)
MCH: 32.9 pg (ref 26.0–34.0)
MCHC: 35.1 g/dL (ref 30.0–36.0)
MCV: 93.7 fL (ref 80.0–100.0)
Monocytes Absolute: 0.5 10*3/uL (ref 0.1–1.0)
Monocytes Relative: 9 %
Neutro Abs: 4.1 10*3/uL (ref 1.7–7.7)
Neutrophils Relative %: 79 %
Platelet Count: 202 10*3/uL (ref 150–400)
RBC: 3.16 MIL/uL — ABNORMAL LOW (ref 4.22–5.81)
RDW: 12.6 % (ref 11.5–15.5)
WBC Count: 5.2 10*3/uL (ref 4.0–10.5)
nRBC: 0 % (ref 0.0–0.2)

## 2020-04-19 MED ORDER — SODIUM CHLORIDE 0.9% FLUSH
10.0000 mL | INTRAVENOUS | Status: AC | PRN
  Administered 2020-04-19: 10 mL
  Filled 2020-04-19: qty 10

## 2020-04-19 MED ORDER — SODIUM CHLORIDE 0.9 % IV SOLN
Freq: Once | INTRAVENOUS | Status: AC
  Filled 2020-04-19: qty 1000

## 2020-04-19 MED ORDER — SODIUM CHLORIDE 0.9% FLUSH
10.0000 mL | INTRAVENOUS | Status: DC | PRN
  Administered 2020-04-19: 10 mL via INTRAVENOUS
  Filled 2020-04-19: qty 10

## 2020-04-19 MED ORDER — HEPARIN SOD (PORK) LOCK FLUSH 100 UNIT/ML IV SOLN
500.0000 [IU] | INTRAVENOUS | Status: AC | PRN
  Administered 2020-04-19: 500 [IU]
  Filled 2020-04-19: qty 5

## 2020-04-19 NOTE — Therapy (Signed)
[place], [place], [address] Phone: [phone]   Fax:  [phone]  Physical Therapy Evaluation  Patient Details  Name: Casey [name] MRN: [id number] Date of Birth: June 22, 1966 Referring Provider (PT): Reita May Date: 04/19/2020   PT End of Session - 04/19/20 1157    Visit Number 1    Number of Visits 2    Date for PT Re-Evaluation 06/14/20    PT Start Time 1133    PT Stop Time 1150    PT Time Calculation (min) 17 min    Activity Tolerance Patient tolerated treatment well    Behavior During Therapy [facility] for tasks assessed/performed           Past Medical History:  Diagnosis Date  . Cancer [facility])     Past Surgical History:  Procedure Laterality Date  . IR GASTROSTOMY TUBE MOD SED  04/03/2020  . IR IMAGING GUIDED PORT INSERTION  04/03/2020    There were no vitals filed for this visit.    Subjective Assessment - 04/19/20 1154    Subjective My mouth is really hurting. I have sores in my mouth.    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 Yes    Pain Score 8     Pain Location Mouth    Pain Descriptors / Indicators Burning;Constant    Pain Type Acute pain    Pain Onset 1 to 4 weeks ago    Pain Frequency Constant    Aggravating Factors  placing and removing dentures    Pain Relieving Factors pain medication, lidocaine mouthwash    Effect of Pain on Daily Activities constant pain              [facility] PT Assessment - 04/19/20 0001      Assessment   Medical Diagnosis floor of mouth cancer    Referring Provider (PT) S[place]    Onset Date/Surgical Date 12/03/19    Hand Dominance Right    Prior Therapy none      Precautions   Precautions Other (comment)    Precaution Comments active cancer       Restrictions   Weight Bearing Restrictions No      Balance Screen   Has the patient fallen in the past 6 months No    Has the patient had a decrease in activity level because of a fear of falling?  No    Is the patient reluctant to leave their home because of a fear of falling?  No      Home Environment   Living Environment Private residence    Living Arrangements Other relatives    Available Help at Discharge Family    Type of [place] Access Level entry    Home Layout Two level      Prior Function   Level of [place] Retired   applying for disability   Leisure walks dogs 3x/day for 10 min      Cognition   Overall Cognitive Status Within Functional Limits for tasks assessed      Functional Tests   Functional tests Sit to Stand      Sit to Stand   Comments 10 reps in 30 sec which is poor  for his age      Posture/Postural Control   Posture/Postural Control Postural limitations    Postural Limitations Rounded Shoulders;Forward head      ROM / Strength   AROM / PROM / Strength AROM      AROM   Overall AROM  Deficits    Overall AROM Comments R shoulder limited to 90 degrees flex and abduction due to DDD with narrowing of spine- L shoulder [facility]    AROM Assessment Site Cervical    Cervical Flexion WFl    Cervical Extension WFL    Cervical - Right Side Bend WFL    Cervical - Left Side Bend WFL    Cervical - Right Rotation WFL    Cervical - Left Rotation [facility]      Ambulation/Gait   Ambulation/Gait Yes    Ambulation/Gait Assistance 7: Independent    Ambulation Distance (Feet) 20 Feet    Gait Pattern Within Functional Limits             LYMPHEDEMA/ONCOLOGY QUESTIONNAIRE - 04/19/20 0001      Lymphedema Assessments   Lymphedema Assessments Head and Neck      Head and Neck   4 cm superior to sternal notch around neck 40.7 cm    6 cm superior to sternal notch around neck 40.5 cm    8 cm superior to sternal notch around  neck 42 cm                   Objective measurements completed on examination: See above findings.               PT Education - 04/19/20 1157    Education Details Neck ROM, importance of posture when sitting, standing and lying down, deep breathing, walking program and importance of staying active throughout treatment, CURE article on staying active, "Why exercise?" flyer, lymphedema and PT info    Person(s) Educated Patient    Methods Explanation;Handout    Comprehension Verbalized understanding               PT Long Term Goals - 04/19/20 1201      PT LONG TERM GOAL #1   Title Pt will return to baseline cervical ROM to allow him to return to PLOF.    Time 8    Period Weeks    Status New    Target Date 06/14/20              Head and Neck Clinic Goals - 04/19/20 1201      Patient will be able to verbalize understanding of a home exercise program for cervical range of motion, posture, and walking.    Status Achieved      Patient will be able to verbalize understanding of proper sitting and standing posture.    Status Achieved      Patient will be able to verbalize understanding of lymphedema risk and availability of treatment for this condition.    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 He is receiving radiation to tongue and bilateral neck. His cervical ROM is full but R shoulder ROM is limited above 90 degrees secondary to progress DDD and narrowing of spine. Pt reports it has been this way for several yearsEducated pt about signs and symptoms of lymphedema  as well as anatomy and physiology of lymphatic system. Educated her in importance of staying as active as possible throughout treatment to decreas fatigue and in head and neck ROM exercises to decrease loss of ROM. Will follow up  with pt in 8 weeks and reassess.    Personal Factors and Comorbidities Comorbidity 1    Comorbidities DDD with narrowing of spine    Examination-Activity Limitations Reach Overhead    Stability/Clinical Decision Making Stable/Uncomplicated    Clinical Decision Making Low    Rehab Potential Good    PT Frequency --   eval and 1 f/u visit   PT Duration 8 weeks    PT Treatment/Interventions ADLs/Self Care Home Management;Therapeutic exercise;Patient/family education    PT Next Visit Plan reassess baselines    PT Home Exercise Plan head and neck ROM exercises    Consulted and Agree with Plan of Care Patient           Patient will benefit from skilled therapeutic intervention in order to improve the following deficits and impairments:  Pain, Postural dysfunction  Visit Diagnosis: 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

## 2020-04-19 NOTE — Progress Notes (Signed)
[place] CSW Progress Notes  Met w patient in infusion prior to Head and Neck MDC.  Reviewed needs.  He lives w sister and family and is well supported.  Enjoys spending time w niece and nephew - this is a change as he used to live in another town and/or state.  Most recently he lived in [place] where he worked in [employer]. When diagnosed w cancer and informed of the treatment plan, he knew he needed to be somewhere w more support so relocated to [facility] in order to live w sister.  He is trying to maintain as much normalcy as possible, tries to eat soft foods.  Prior to diagnosis, he had teeth removed in preparation for dentures.  Now he cannot get the dentures until well past treatment.  This makes it difficult for him to eat.  Discussed ways to maintain control of things he can control (getting adequate nutrition and using feeding tube when necessary, completing speech pathology and PT prescribed exercises, getting to appointments, letting us know of any issues/concerns).  He likes to cook and enjoys eating - he is identifying foods that he can taste/taste good. Encouraged exploration of this.  He is looking forward to practical hands on instruction on use of feeding tube.  He gets outside and tries to maintain activities that bring him pleasure as much as possible.  Will follow up w him in two weeks.  Provided information packet from [place].  Edwyna Shell, LCSW Clinical Social Worker Phone:  [phone]

## 2020-04-19 NOTE — Therapy (Signed)
[place] [address] [place], [place], [address] Phone: [phone]   Fax:  [phone]  Speech Language Pathology Evaluation  Patient Details  Name: Casey [name] MRN: [id number] Date of Birth: 01-23-1966 Referring Provider (SLP): Eppie Gibson, MD   Encounter Date: 04/19/2020   End of Session - 04/19/20 2308    Visit Number 1    Number of Visits 7    Date for SLP Re-Evaluation 07/18/20    SLP Start Time 1115    SLP Stop Time  1150    SLP Time Calculation (min) 35 min    Activity Tolerance Patient tolerated treatment well           Past Medical History:  Diagnosis Date  . Cancer [facility])     Past Surgical History:  Procedure Laterality Date  . IR GASTROSTOMY TUBE MOD SED  04/03/2020  . IR IMAGING GUIDED PORT INSERTION  04/03/2020    There were no vitals filed for this visit.   Subjective Assessment - 04/19/20 1203    Subjective "To eat at all I have to take out my top dentures entirely."    Currently in Pain? Yes    Pain Score 8     Pain Location Mouth    Pain Orientation Right;Left    Pain Descriptors / Indicators Burning;Sore    Pain Type Acute pain    Pain Onset 1 to 4 weeks ago    Pain Frequency Constant              SLP Evaluation [facility] - 04/19/20 1203      SLP Visit Information   SLP Received On 04/19/20    Referring Provider (SLP) Eppie Gibson, MD    Onset Date December 2020    Medical Diagnosis [facility] floor of mouth      Subjective   Patient/Family Stated Goal Maintain swallow function      General Information   HPI Pt discovered mass in floor of mouth in December 2020 by dentist. Work [place] included biopsy May 2021. Surgery was discussed but pt declined. Pt moved locally to have support from sister during [place] tx. PET/CT in May 2021 revealed hypermetabolic anterior tongue mass, rt cervical level 1b ademopathy, adn potential bil lymphadenopathy. No distant  mets ID'd. CT maxillofacial in May 2021 demonstrated possible mandibular invasion and MRI confirmed this. Pt started rad 04-05-20. PEG placed 04-03-20.       Prior Functional Status   Cognitive/Linguistic Baseline Within functional limits      Cognition   Overall Cognitive Status Within Functional Limits for tasks assessed      Auditory Comprehension   Overall Auditory Comprehension Appears within functional limits for tasks assessed      Oral Motor/Sensory Function   Overall Oral Motor/Sensory Function Appears within functional limits for tasks assessed    Labial Coordination WFL    Lingual ROM Reduced right;Reduced left    Lingual Symmetry Within Functional Limits    Lingual Strength --   CNT due to decr'd ROM   Lingual Coordination Reduced      Motor Speech   Overall Motor Speech Impaired    Articulation Impaired    Level of Impairment --   word- due to tumor placement   Intelligibility Intelligible          Pt currently tolerates soft food and thin liquids.  POs: Pt ate applesauce without overt s/s aspiration. Thyroid elevation appeared WNL, and swallows appeared timely.  Oral residue noted as minimal. Pt's swallow deemed WFL at this time.   Because data states the risk for dysphagia during and after radiation treatment is high due to undergoing radiation tx, SLP taught pt about the possibility of reduced/limited ability for PO intake during rad tx. SLP encouraged pt to continue swallowing POs as far into rad tx as possible, even ingesting POs and/or completing HEP shortly after administration of pain meds. Among other modifications for days when pt cannot functionally swallow, SLP talked about performing only non-swallowing tasks on the handout/HEP, and then adding swallowing tasks back in when it becomes possible to do so.  SLP educated pt re: changes to swallowing musculature after rad tx, and why adherence to dysphagia HEP provided today and PO consumption was necessary to  inhibit muscular disuse atrophy and to reduce muscle fibrosis following rad tx. Pt demonstrated understanding of these things to SLP.    SLP then developed a HEP for pt and pt was instructed how to perform exercises involving lingual, vocal, and pharyngeal strengthening. SLP performed each exercise and pt return demonstrated each exercise. SLP ensured pt performance was correct prior to moving on to next exercise. Pt was instructed to complete this program 2 times a day, 6-7 days/week until 6 months after his last rad tx, then x2 a week after that.                   SLP Education - 04/19/20 2306    Education Details HEP procedure, late effects of head/neck radiation on swallowing ability    Person(s) Educated Patient    Methods Explanation;Demonstration;Verbal cues    Comprehension Verbalized understanding;Returned demonstration;Verbal cues required;Need further instruction            SLP Short Term Goals - 04/19/20 2320      SLP [place] #1   Title pt will complete HEP with rare min A    Time 2    Period --   visits, for all STGs   Status New      SLP SHORT TERM GOAL #2   Title pt will tell SLP why pt is completing HEP with modified independence in 2 sessions    Time 3    Status New      SLP SHORT TERM GOAL #3   Title pt will tell SLP how a food journal could hasten return to a more normalized diet    Time 2    Status New      SLP SHORT TERM GOAL #4   Title pt will describe 3 overt s/s aspiration PNA with modified independence    Time 3    Status New            SLP Long Term Goals - 04/19/20 2322      SLP LONG TERM GOAL #1   Title pt will complete HEP with modified independence over two visits    Time 4    Period --   visits, for all LTGs   Status New      SLP LONG TERM GOAL #2   Title pt will describe how to modify HEP over time, and the timeline associated with reduction in HEP frequency with modified independence over two sessions    Time  6    Status New            Plan - 04/19/20 2318    Clinical Impression Statement At this time pt swallowing is deemed WNL/WFL with applesauce  and water. Pt politely refused other solids (cheese stick and ham sandwich), referring to his "S" statement. SLP designed an individualized HEP for dysphagia and pt completed each exercise on their own with ** cues. There are no overt s/s aspiration reported by pt at this time. Data indicate that pt's swallow ability will likely decrease over the course of radiation therapy and could very well decline over time following conclusion of their radiation therapy due to muscle disuse atrophy and/or muscle fibrosis. Pt will cont to need to be seen by SLP in order to assess safety of PO intake, assess the need for recommending any objective swallow assessment, and ensuring pt correctly completes the individualized HEP.    Speech Therapy Frequency --   once approx every 4 weeks   Duration --   7 total visits   Treatment/Interventions Aspiration precaution training;Pharyngeal strengthening exercises;Diet toleration management by SLP;Trials of upgraded texture/liquids;Patient/family education;Compensatory strategies;Internal/external aids;SLP instruction and feedback    Potential to Achieve Goals Good    SLP Home Exercise Plan provided today    Consulted and Agree with Plan of Care Patient           Patient will benefit from skilled therapeutic intervention in order to improve the following deficits and impairments:   Dysphagia, oropharyngeal phase    Problem List Patient Active Problem List   Diagnosis Date Noted  . 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 [place], [place], [address] Phone: [phone]   Fax:   [phone]  Name: Ridgely Anastacio Girardin MRN: [id number] Date of Birth: 1966-07-06

## 2020-04-19 NOTE — Patient Instructions (Signed)
SWALLOWING EXERCISES Do these until 6 months after your last day of radiation, then 2-3 times per week afterwards  1. Effortful Swallows - Press your tongue against the roof of your mouth for 3 seconds, then squeeze the muscles in your neck while you swallow your saliva or a sip of water - Repeat 10-15 times, 2-3 times a day, and use whenever you eat or drink  2. Pitch Raise - Repeat "he", once per second in as high of a pitch as you can - Repeat 20 times, 2-3 times a day  3. Shaker Exercise - head lift - Lie flat on your back in your bed or on a couch without pillows - Raise your head and look at your feet - KEEP YOUR SHOULDERS DOWN - HOLD FOR 45-60 SECONDS, then lower your head back down - Repeat 3 times, 2-3 times a day  4. Mendelsohn Maneuver - "half swallow" exercise - Start to swallow, and keep your Adam's apple up by squeezing hard with the muscles of the throat - Hold the squeeze for 5-7 seconds and then relax - Repeat 10-15 times, 2-3 times a day *use a wet spoon if your mouth gets dry*  5. Chin pushback - Open your mouth  - Place your fist UNDER your chin near your neck - Tuck your chin and push back with your fist for 5 seconds - Repeat 10 times, 2-3 times a day        6. "Super Swallow"  - Take a breath and hold it  - Bear down (like pushing your bowels)  - Swallow then IMMEDIATELY cough  - Repeat 10 times, 2-3 times a day

## 2020-04-19 NOTE — Patient Instructions (Signed)

## 2020-04-19 NOTE — Progress Notes (Signed)
Oncology Nurse Navigator Documentation  [name] was scheduled for Head and Neck MDC after his IVF infusion today. I discussed his schedule with him yesterday and he was agreeable. I discovered [name] outside scheduling is ride to leave after his infusion, but was able to bring him back downstairs to see Casey [name] SLP and Allyson Sabal PT. I apologized for the confusion and he verbalized his understanding. He saw Glendell Docker and [place] as scheduled. He knows to call me if he has any further questions or concerns.  [name] RN, BSN, OCN Head & Neck Oncology Nurse [place] at [facility] Phone # [phone]  Fax # [phone]

## 2020-04-20 ENCOUNTER — Other Ambulatory Visit: Payer: Self-pay

## 2020-04-20 ENCOUNTER — Ambulatory Visit
Admission: RE | Admit: 2020-04-20 | Discharge: 2020-04-20 | Disposition: A | Discharge status: Home / Self Care | Payer: Medicaid Other | Source: Ambulatory Visit | Attending: Radiation Oncology | Admitting: Radiation Oncology

## 2020-04-20 DIAGNOSIS — C023 Malignant neoplasm of anterior two-thirds of tongue, part unspecified: Secondary | ICD-10-CM | POA: Diagnosis not present

## 2020-04-20 DIAGNOSIS — C049 Malignant neoplasm of floor of mouth, unspecified: Secondary | ICD-10-CM | POA: Diagnosis not present

## 2020-04-23 ENCOUNTER — Other Ambulatory Visit: Payer: Self-pay | Admitting: Radiation Oncology

## 2020-04-23 ENCOUNTER — Ambulatory Visit
Admission: RE | Admit: 2020-04-23 | Discharge: 2020-04-23 | Disposition: A | Discharge status: Home / Self Care | Payer: Medicaid Other | Source: Ambulatory Visit | Attending: Radiation Oncology | Admitting: Radiation Oncology

## 2020-04-23 ENCOUNTER — Other Ambulatory Visit: Payer: Self-pay | Admitting: Hematology and Oncology

## 2020-04-23 ENCOUNTER — Other Ambulatory Visit: Payer: Self-pay

## 2020-04-23 DIAGNOSIS — C049 Malignant neoplasm of floor of mouth, unspecified: Secondary | ICD-10-CM

## 2020-04-23 DIAGNOSIS — C023 Malignant neoplasm of anterior two-thirds of tongue, part unspecified: Secondary | ICD-10-CM

## 2020-04-23 LAB — CBC WITH DIFFERENTIAL (CANCER CENTER ONLY)
Abs Immature Granulocytes: 0.02 10*3/uL (ref 0.00–0.07)
Basophils Absolute: 0 10*3/uL (ref 0.0–0.1)
Basophils Relative: 0 %
Eosinophils Absolute: 0 10*3/uL (ref 0.0–0.5)
Eosinophils Relative: 1 %
HCT: 34.2 % — ABNORMAL LOW (ref 39.0–52.0)
Hemoglobin: 11.6 g/dL — ABNORMAL LOW (ref 13.0–17.0)
Immature Granulocytes: 1 %
Lymphocytes Relative: 21 %
Lymphs Abs: 0.8 10*3/uL (ref 0.7–4.0)
MCH: 31.9 pg (ref 26.0–34.0)
MCHC: 33.9 g/dL (ref 30.0–36.0)
MCV: 94 fL (ref 80.0–100.0)
Monocytes Absolute: 0.5 10*3/uL (ref 0.1–1.0)
Monocytes Relative: 14 %
Neutro Abs: 2.3 10*3/uL (ref 1.7–7.7)
Neutrophils Relative %: 63 %
Platelet Count: 244 10*3/uL (ref 150–400)
RBC: 3.64 MIL/uL — ABNORMAL LOW (ref 4.22–5.81)
RDW: 12.9 % (ref 11.5–15.5)
WBC Count: 3.6 10*3/uL — ABNORMAL LOW (ref 4.0–10.5)
nRBC: 0 % (ref 0.0–0.2)

## 2020-04-23 LAB — BASIC METABOLIC PANEL - CANCER CENTER ONLY
Anion gap: 7 (ref 5–15)
BUN: 13 mg/dL (ref 6–20)
CO2: 30 mmol/L (ref 22–32)
Calcium: 10.1 mg/dL (ref 8.9–10.3)
Chloride: 98 mmol/L (ref 98–111)
Creatinine: 0.92 mg/dL (ref 0.61–1.24)
GFR, Est AFR Am: >60 mL/min (ref >60)
GFR, Estimated: >60 mL/min (ref >60)
Glucose, Bld: 113 mg/dL — ABNORMAL HIGH (ref 70–99)
Potassium: 4.8 mmol/L (ref 3.5–5.1)
Sodium: 135 mmol/L (ref 135–145)

## 2020-04-23 MED ORDER — FLUCONAZOLE 100 MG PO TABS: ORAL_TABLET | ORAL | 0 refills | Status: DC

## 2020-04-23 MED ORDER — SCOPOLAMINE 1 MG/3DAYS TD PT72: 1.0000 | MEDICATED_PATCH | TRANSDERMAL | 3 refills | Status: DC

## 2020-04-23 MED ORDER — OXYCODONE-ACETAMINOPHEN 5-325 MG PO TABS
1.0000 | ORAL_TABLET | Freq: Four times a day (QID) | ORAL | 0 refills | Status: DC | PRN
Start: 2020-04-23 — End: 2020-05-08

## 2020-04-24 ENCOUNTER — Ambulatory Visit
Admission: RE | Admit: 2020-04-24 | Discharge: 2020-04-24 | Disposition: A | Discharge status: Home / Self Care | Payer: Medicaid Other | Source: Ambulatory Visit | Attending: Radiation Oncology | Admitting: Radiation Oncology

## 2020-04-24 ENCOUNTER — Other Ambulatory Visit: Payer: Self-pay

## 2020-04-24 DIAGNOSIS — C023 Malignant neoplasm of anterior two-thirds of tongue, part unspecified: Secondary | ICD-10-CM | POA: Diagnosis not present

## 2020-04-24 DIAGNOSIS — C049 Malignant neoplasm of floor of mouth, unspecified: Secondary | ICD-10-CM | POA: Diagnosis not present

## 2020-04-25 ENCOUNTER — Ambulatory Visit
Admission: RE | Admit: 2020-04-25 | Discharge: 2020-04-25 | Disposition: A | Discharge status: Home / Self Care | Payer: Medicaid Other | Source: Ambulatory Visit | Attending: Radiation Oncology | Admitting: Radiation Oncology

## 2020-04-25 ENCOUNTER — Other Ambulatory Visit: Payer: Self-pay

## 2020-04-25 ENCOUNTER — Encounter: Payer: Self-pay | Admitting: General Practice

## 2020-04-25 DIAGNOSIS — C049 Malignant neoplasm of floor of mouth, unspecified: Secondary | ICD-10-CM | POA: Diagnosis not present

## 2020-04-25 DIAGNOSIS — C023 Malignant neoplasm of anterior two-thirds of tongue, part unspecified: Secondary | ICD-10-CM | POA: Diagnosis not present

## 2020-04-25 NOTE — Progress Notes (Signed)
Patient Care Team: Pcp, No as PCP - General [place]elt, Stephani Police, RN as Oncology Nurse Navigator Eppie Gibson, MD as Attending Physician (Radiation Oncology) Nicholas Lose, MD as Consulting Physician (Hematology and Oncology)  DIAGNOSIS:    ICD-10-CM   1. 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 CT maxillofacial on 12/22/19 showed postsurgical changes and mandibular invasion, with cervical lymphadenopathy consistent with metastatic nodes. PET scan on 02/16/20 showed the tongue mass with lymph node metastases on the right floor of the mouth and cervical lymph nodes.   03/07/2020 Cancer Staging   Staging form: Oral Cavity, AJCC 8th Edition - Clinical: Stage IVA (cT4a, cN2b, cM0) - Signed by Nicholas Lose, MD on 03/07/2020   04/06/2020 -  Chemotherapy   The patient had dexamethasone (DECADRON) 4 MG tablet, 8 mg, Oral, Daily, 1 of 1 cycle, Start date: 03/07/2020, End date: -- palonosetron (ALOXI) injection 0.25 mg, 0.25 mg, Intravenous,  Once, 1 of 3 cycles Administration: 0.25 mg (04/06/2020) CISplatin (PLATINOL) 165 mg in sodium chloride 0.9 % 500 mL chemo infusion, 100 mg/m2 = 165 mg, Intravenous,  Once, 1 of 3 cycles Dose modification: 75 mg/m2 (original dose 100 mg/m2, Cycle 2, Reason: Provider Judgment) Administration: 165 mg (04/06/2020) fosaprepitant (EMEND) 150 mg in sodium chloride 0.9 % 145 mL IVPB, 150 mg, Intravenous,  Once, 1 of 3 cycles Administration: 150 mg (04/06/2020)  for chemotherapy treatment.      CHIEF COMPLIANT: Cycle 1 Cisplatin  INTERVAL HISTORY: Casey [name] is a 54 y.o.  with above-mentioned history of floor of the mouthcancer. He is currently on concurrent chemoradiation treatment withevery 3 weekcisplatin. He presents to the clinic todayfor a toxicity check and cycle 2. he is complaining of difficulty with swallowing and pain in the throat.  He has not been drinking much water.  He is meeting the dietitian.  He has not also been eating much food either.  His pain is under reasonable control with oxycodone.  He feels extremely tired.  ALLERGIES:  has No Known Allergies.  MEDICATIONS:  Current Outpatient Medications  Medication Sig Dispense Refill  . dexamethasone (DECADRON) 4 MG tablet Take 1 tablet (4 mg total) by mouth daily. Take daily for 3 days after chemo. Take with food. 30 tablet 1  . fluconazole (DIFLUCAN) 100 MG tablet Take 2 tablets today, then 1 tablet daily x 20 more days. 22 tablet 0  . ibuprofen (ADVIL) 200 MG tablet Take 600 mg by mouth every 6 (six) hours as needed for moderate pain.    Marland Kitchen lidocaine (XYLOCAINE) 2 % solution Patient: Mix 1part 2% viscous lidocaine, 1part H20. Swish/spit 38mL of diluted mixture to numb mouth, q 2hrs PRN. 200 mL 4  . lidocaine-prilocaine (EMLA) cream Apply to affected area once (Patient not taking: Reported on 03/20/2020) 30 g 3  . LORazepam (ATIVAN) 0.5 MG tablet Take 1 tablet (0.5 mg total) by mouth at bedtime as needed (Nausea or vomiting). (Patient not taking: Reported on 03/20/2020) 30 tablet 1  . Multiple Vitamin (MULTIVITAMIN) tablet Take 1 tablet by mouth daily.    . ondansetron (ZOFRAN) 8 MG tablet Take 1 tablet (8  mg total) by mouth 2 (two) times daily as needed. Start on the third day after chemotherapy. (Patient not taking: Reported on 03/20/2020) 30 tablet 1  . oxyCODONE-acetaminophen (PERCOCET) 5-325 MG tablet Take 1 tablet by mouth every 6 (six) hours as needed for severe pain. 90 tablet 0  . prochlorperazine (COMPAZINE) 10 MG tablet Take 1 tablet (10 mg total) by mouth every 6 (six) hours as needed  (Nausea or vomiting). (Patient not taking: Reported on 03/20/2020) 30 tablet 1  . scopolamine (TRANSDERM-SCOP, 1.5 MG,) 1 MG/3DAYS Place 1 patch (1.5 mg total) onto the skin every 3 (three) days. 10 patch 3   No current facility-administered medications for this visit.    PHYSICAL EXAMINATION: ECOG PERFORMANCE STATUS: 2 - Symptomatic, <50% confined to bed  Vitals:   04/26/20 0825  BP: 123/85  Pulse: 72  Resp: 17  Temp: (!) 96.8 F (36 C)  SpO2: 100%   Filed Weights   04/26/20 0825  Weight: 129 lb 8 oz (58.7 kg)    LABORATORY DATA:  I have reviewed the data as listed CMP Latest Ref Rng & Units 04/23/2020 04/19/2020 04/12/2020  Glucose 70 - 99 mg/dL 113(H) 120(H) 119(H)  BUN 6 - 20 mg/dL 13 12 33(H)  Creatinine 0.61 - 1.24 mg/dL 0.92 0.84 1.27(H)  Sodium 135 - 145 mmol/L 135 133(L) 131(L)  Potassium 3.5 - 5.1 mmol/L 4.8 4.3 4.1  Chloride 98 - 111 mmol/L 98 97(L) 96(L)  CO2 22 - 32 mmol/L 30 25 25   Calcium 8.9 - 10.3 mg/dL 10.1 9.4 9.6  Total Protein 6.5 - 8.1 g/dL - - -  Total Bilirubin 0.3 - 1.2 mg/dL - - -  Alkaline Phos 38 - 126 U/L - - -  AST 15 - 41 U/L - - -  ALT 0 - 44 U/L - - -    Lab Results  Component Value Date   WBC 2.3 (L) 04/26/2020   HGB 10.5 (L) 04/26/2020   HCT 30.1 (L) 04/26/2020   MCV 93.2 04/26/2020   PLT 215 04/26/2020   NEUTROABS 1.3 (L) 04/26/2020    ASSESSMENT & PLAN:  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  CT maxillofacial on 12/22/19 showed postsurgical changes and mandibular invasion, with cervical lymphadenopathy consistent with metastatic nodes. MRI neck: 12/23/2019: Floor of mouth mass 3.4 cm with involvement of genioglossus muscle and erosion of inner cortex of the right mandible, multiple necrotic level 1B  lymph nodes largest 1.6 cm T4AN2B M0 stage IVa clinical stage  PET scan on 02/16/20 showed the tongue mass with lymph node metastases on the right floor of the mouth and cervical lymph nodes. CK7 and CD 117 are negative, p63 and CK 5/6 are positive, p16 is negative  Treatment plan: 1.Concurrent chemoradiation with cisplatin every 3 weeks x3 2.PEG tube and port placement 3.Patient will require IV fluids on a weekly basis --------------------------------------------------------------------------------------------------------------------------------------- Current treatment: Cycle 2 cisplatin (started 04/06/2020) with XRT Patient will come weekly for IV fluid hydration  Toxicities: Denies any nausea or vomiting. Loss of taste and appetite Decreased oral intake of fluids: Patient is meeting with dietitian and also receiving IV fluids He needs to start taking tube feeds.  Return to clinic 3 times a week for IV fluids and in 3 weeks for cycle 3 of cisplatin  No orders of the defined types were placed in this encounter.  The patient has a good understanding of the overall plan. he agrees with it. he will call with any problems that may develop before the next visit here.  Total time spent: 30 mins including face to face time and time spent for planning, charting and coordination of care  Nicholas Lose, MD 04/26/2020  I, Cloyde Reams Dorshimer, am acting as scribe for Dr. Nicholas Lose.  I have reviewed the above documentation for accuracy and completeness, and I agree with the above.

## 2020-04-25 NOTE — Progress Notes (Signed)
[place] CSW Progress Notes  Call to patient after receiving request from Purvis Kilts RN to help patient afford needed medications.  Reviewed chart, patient is currently uninsured.  Per patient, he has applied for Medicaid at [place] and for disability through [employer].  He has been told he will not have a decision on his case for 3 - 4 months.  Advised that he can apply for the [employer] which will help w medications at [facility]  He asks that I speak w his sister, he will ask her to call me so I can clarify this process.  Edwyna Shell, LCSW Clinical Social Worker Phone:  [phone]

## 2020-04-26 ENCOUNTER — Inpatient Hospital Stay (HOSPITAL_BASED_OUTPATIENT_CLINIC_OR_DEPARTMENT_OTHER): Payer: Medicaid Other | Admitting: Hematology and Oncology

## 2020-04-26 ENCOUNTER — Other Ambulatory Visit: Payer: Self-pay

## 2020-04-26 ENCOUNTER — Inpatient Hospital Stay: Payer: Medicaid Other | Admitting: Nutrition

## 2020-04-26 ENCOUNTER — Inpatient Hospital Stay: Payer: Medicaid Other

## 2020-04-26 ENCOUNTER — Telehealth: Payer: Self-pay | Admitting: Hematology and Oncology

## 2020-04-26 ENCOUNTER — Ambulatory Visit
Admission: RE | Admit: 2020-04-26 | Discharge: 2020-04-26 | Disposition: A | Discharge status: Home / Self Care | Payer: Medicaid Other | Source: Ambulatory Visit | Attending: Radiation Oncology | Admitting: Radiation Oncology

## 2020-04-26 DIAGNOSIS — C049 Malignant neoplasm of floor of mouth, unspecified: Secondary | ICD-10-CM

## 2020-04-26 DIAGNOSIS — C023 Malignant neoplasm of anterior two-thirds of tongue, part unspecified: Secondary | ICD-10-CM | POA: Diagnosis not present

## 2020-04-26 DIAGNOSIS — Z95828 Presence of other vascular implants and grafts: Secondary | ICD-10-CM

## 2020-04-26 DIAGNOSIS — Z5111 Encounter for antineoplastic chemotherapy: Secondary | ICD-10-CM | POA: Diagnosis not present

## 2020-04-26 DIAGNOSIS — Z452 Encounter for adjustment and management of vascular access device: Secondary | ICD-10-CM | POA: Diagnosis not present

## 2020-04-26 DIAGNOSIS — C770 Secondary and unspecified malignant neoplasm of lymph nodes of head, face and neck: Secondary | ICD-10-CM | POA: Diagnosis not present

## 2020-04-26 DIAGNOSIS — Z23 Encounter for immunization: Secondary | ICD-10-CM | POA: Diagnosis not present

## 2020-04-26 LAB — CBC WITH DIFFERENTIAL (CANCER CENTER ONLY)
Abs Immature Granulocytes: 0.01 10*3/uL (ref 0.00–0.07)
Basophils Absolute: 0 10*3/uL (ref 0.0–0.1)
Basophils Relative: 0 %
Eosinophils Absolute: 0.1 10*3/uL (ref 0.0–0.5)
Eosinophils Relative: 3 %
HCT: 30.1 % — ABNORMAL LOW (ref 39.0–52.0)
Hemoglobin: 10.5 g/dL — ABNORMAL LOW (ref 13.0–17.0)
Immature Granulocytes: 0 %
Lymphocytes Relative: 23 %
Lymphs Abs: 0.5 10*3/uL — ABNORMAL LOW (ref 0.7–4.0)
MCH: 32.5 pg (ref 26.0–34.0)
MCHC: 34.9 g/dL (ref 30.0–36.0)
MCV: 93.2 fL (ref 80.0–100.0)
Monocytes Absolute: 0.4 10*3/uL (ref 0.1–1.0)
Monocytes Relative: 17 %
Neutro Abs: 1.3 10*3/uL — ABNORMAL LOW (ref 1.7–7.7)
Neutrophils Relative %: 57 %
Platelet Count: 215 10*3/uL (ref 150–400)
RBC: 3.23 MIL/uL — ABNORMAL LOW (ref 4.22–5.81)
RDW: 12.9 % (ref 11.5–15.5)
WBC Count: 2.3 10*3/uL — ABNORMAL LOW (ref 4.0–10.5)
nRBC: 0 % (ref 0.0–0.2)

## 2020-04-26 LAB — BASIC METABOLIC PANEL - CANCER CENTER ONLY
Anion gap: 5 (ref 5–15)
BUN: 12 mg/dL (ref 6–20)
CO2: 31 mmol/L (ref 22–32)
Calcium: 9.5 mg/dL (ref 8.9–10.3)
Chloride: 97 mmol/L — ABNORMAL LOW (ref 98–111)
Creatinine: 0.84 mg/dL (ref 0.61–1.24)
GFR, Est AFR Am: >60 mL/min (ref >60)
GFR, Estimated: >60 mL/min (ref >60)
Glucose, Bld: 127 mg/dL — ABNORMAL HIGH (ref 70–99)
Potassium: 4.1 mmol/L (ref 3.5–5.1)
Sodium: 133 mmol/L — ABNORMAL LOW (ref 135–145)

## 2020-04-26 MED ORDER — SODIUM CHLORIDE 0.9% FLUSH
10.0000 mL | INTRAVENOUS | Status: DC | PRN
  Administered 2020-04-26: 10 mL
  Filled 2020-04-26: qty 10

## 2020-04-26 MED ORDER — SODIUM CHLORIDE 0.9 % IV SOLN
150.0000 mg | Freq: Once | INTRAVENOUS | Status: AC
  Administered 2020-04-26: 150 mg via INTRAVENOUS
  Filled 2020-04-26: qty 150

## 2020-04-26 MED ORDER — SODIUM CHLORIDE 0.9% FLUSH
10.0000 mL | INTRAVENOUS | Status: DC | PRN
  Administered 2020-04-26: 10 mL via INTRAVENOUS
  Filled 2020-04-26: qty 10

## 2020-04-26 MED ORDER — OSMOLITE 1.5 CAL PO LIQD: ORAL | 0 refills | Status: DC

## 2020-04-26 MED ORDER — SODIUM CHLORIDE 0.9 % IV SOLN
10.0000 mg | Freq: Once | INTRAVENOUS | Status: AC
  Administered 2020-04-26: 10 mg via INTRAVENOUS
  Filled 2020-04-26: qty 10

## 2020-04-26 MED ORDER — PALONOSETRON HCL INJECTION 0.25 MG/5ML
INTRAVENOUS | Status: AC
  Filled 2020-04-26: qty 5

## 2020-04-26 MED ORDER — SODIUM CHLORIDE 0.9 % IV SOLN
75.0000 mg/m2 | Freq: Once | INTRAVENOUS | Status: AC
  Administered 2020-04-26: 124 mg via INTRAVENOUS
  Filled 2020-04-26: qty 124

## 2020-04-26 MED ORDER — HEPARIN SOD (PORK) LOCK FLUSH 100 UNIT/ML IV SOLN
500.0000 [IU] | Freq: Once | INTRAVENOUS | Status: AC | PRN
  Administered 2020-04-26: 500 [IU]
  Filled 2020-04-26: qty 5

## 2020-04-26 MED ORDER — DEXAMETHASONE 4 MG PO TABS: 4.0000 mg | ORAL_TABLET | Freq: Every day | ORAL | 1 refills | Status: DC

## 2020-04-26 MED ORDER — PALONOSETRON HCL INJECTION 0.25 MG/5ML
0.2500 mg | Freq: Once | INTRAVENOUS | Status: AC
  Administered 2020-04-26: 0.25 mg via INTRAVENOUS

## 2020-04-26 MED ORDER — SODIUM CHLORIDE 0.9 % IV SOLN
Freq: Once | INTRAVENOUS | Status: AC
  Filled 2020-04-26: qty 10

## 2020-04-26 MED ORDER — SODIUM CHLORIDE 0.9 % IV SOLN
Freq: Once | INTRAVENOUS | Status: AC
  Filled 2020-04-26: qty 250

## 2020-04-26 NOTE — Patient Instructions (Signed)
[facility] Discharge Instructions for Patients Receiving Chemotherapy  Today you received the following chemotherapy agents Cisplatin  To help prevent nausea and vomiting after your treatment, we encourage you to take your nausea medication as directed  If you develop nausea and vomiting that is not controlled by your nausea medication, call the clinic.   BELOW ARE SYMPTOMS THAT SHOULD BE REPORTED IMMEDIATELY:  *FEVER GREATER THAN 100.5 F  *CHILLS WITH OR WITHOUT FEVER  NAUSEA AND VOMITING THAT IS NOT CONTROLLED WITH YOUR NAUSEA MEDICATION  *UNUSUAL SHORTNESS OF BREATH  *UNUSUAL BRUISING OR BLEEDING  TENDERNESS IN MOUTH AND THROAT WITH OR WITHOUT PRESENCE OF ULCERS  *URINARY PROBLEMS  *BOWEL PROBLEMS  UNUSUAL RASH Items with * indicate a potential emergency and should be followed up as soon as possible.  Feel free to call the clinic should you have any questions or concerns. The clinic phone number is [phone].  Please show the CHEMO ALERT CARD at check-in to the Emergency Department and triage nurse.   

## 2020-04-26 NOTE — Progress Notes (Signed)
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 Weight decreased and was documented as 129.5 pounds on September 23. Patient denies nausea, vomiting, constipation, or diarrhea. Reports last bowel movement was yesterday. Reports he has eaten very little over the past 2 days. Complains of very thick saliva and mouth sores. He has been using an additional 60 mL of water through his feeding tube in addition to free water flushes.  Estimated nutrition needs: 1800-2100 cal, 90-105 g protein, 1.8 L fluid.  Nutrition diagnosis: Predicted suboptimal energy intake has evolved into inadequate oral intake related to tongue cancer as evidenced by 4.7 pounds weight loss over 1.5 weeks.  Intervention: Educated patient on giving bolus tube feeding using Osmolite 1.5. Patient will begin with 1 carton Osmolite 1.5, 4 times daily with 60 mL free water before and after bolus feedings. Educated patient to allow 4 hours between bolus feedings. Estimated times are 7 AM, 11 AM, 3 PM, 7 PM. Patient will flush feeding tube with 240 mL free water 3 times daily between bolus feedings. Written instructions were provided. Patient demonstrated tube feeding administration. Educated patient on the importance of baking soda and salt water rinses as well as adequate fluid intake to minimize thick saliva. Questions were answered. Teach back method used. Tube feeding orders were written and [employer] notified.  6 cartons Osmolite 1.5 provides 2130 cal, 89.4 g protein, 2286 mL free water. This is greater than 90% estimated needs.  Monitoring, evaluation, goals: Patient will tolerate tube feeding at goal rate to minimize weight loss and provide greater than 90% estimated needs.  Next visit: Thursday, September 30 during IV fluids.  **Disclaimer: This note was dictated with voice recognition software. Similar sounding words can inadvertently be transcribed and this note  may contain transcription errors which may not have been corrected upon publication of note.**

## 2020-04-26 NOTE — Assessment & Plan Note (Signed)
12/21/2019:In March 2021 he underwent dental etxractions and his dentists referred him for evaluation. 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  CT maxillofacial on 12/22/19 showed postsurgical changes and mandibular invasion, with cervical lymphadenopathy consistent with metastatic nodes. MRI neck: 12/23/2019: Floor of mouth mass 3.4 cm with involvement of genioglossus muscle and erosion of inner cortex of the right mandible, multiple necrotic level 1B lymph nodes largest 1.6 cm T4AN2B M0 stage IVa clinical stage  PET scan on 02/16/20 showed the tongue mass with lymph node metastases on the right floor of the mouth and cervical lymph nodes. CK7 and CD 117 are negative, p63 and CK 5/6 are positive, p16 is negative  Treatment plan: 1.Concurrent chemoradiation with cisplatin every 3 weeks x3 2.PEG tube and port placement 3.Patient will require IV fluids on a weekly basis --------------------------------------------------------------------------------------------------------------------------------------- Current treatment: Cycle 2 cisplatin (started 04/06/2020) with XRT Patient will come weekly for IV fluid hydration  Toxicities: Denies any nausea or vomiting. Loss of taste and appetite Decreased oral intake of fluids: Patient is meeting with dietitian and also receiving IV fluids  Return to clinic weekly for IV fluids and in 3 weeks for cycle 3 of cisplatin

## 2020-04-26 NOTE — Patient Instructions (Signed)

## 2020-04-26 NOTE — Telephone Encounter (Signed)
Scheduled appts per 9/23 los. Gave pt a print out of AVS.

## 2020-04-26 NOTE — Progress Notes (Signed)
Per Dr. Lindi Adie, okay to treat with [place] 1.3.

## 2020-04-27 ENCOUNTER — Ambulatory Visit
Admission: RE | Admit: 2020-04-27 | Discharge: 2020-04-27 | Disposition: A | Discharge status: Home / Self Care | Payer: Medicaid Other | Source: Ambulatory Visit | Attending: Radiation Oncology | Admitting: Radiation Oncology

## 2020-04-27 ENCOUNTER — Other Ambulatory Visit: Payer: Self-pay

## 2020-04-27 DIAGNOSIS — C049 Malignant neoplasm of floor of mouth, unspecified: Secondary | ICD-10-CM | POA: Diagnosis not present

## 2020-04-27 DIAGNOSIS — C023 Malignant neoplasm of anterior two-thirds of tongue, part unspecified: Secondary | ICD-10-CM | POA: Diagnosis not present

## 2020-04-27 NOTE — Progress Notes (Signed)
Oncology Nurse Navigator Documentation  I met with Mr. Keep and his sister Webb Silversmith today. Anne requested the meeting to provide PEG education to her so she can assist her brother as needed with his tube feedings. I met with them and Mr. Mcconathy was able to provide education to his sister while I observed. He is flushing his PEG tube without difficulty and has begun using it for nutritional supplements as advised by Dory Peru RD yesterday. They voice no concerns at this time regarding the PEG and recommended schedule of feedings. Mr. Castrejon plans to use his feeding tube today, while his sister is home, so that she can observe further.  After PEG education, I then escorted them to the Radiation waiting area where they were able to meet with Vincente Liberty from financial and discussed qualifications for the grant offered at the cancer center for medications. They plan to provide Adrienne with the information needed.   [name] RN, BSN, OCN Head & Neck Oncology Nurse [place] at [facility] Phone # [phone]  Fax # [phone]

## 2020-04-30 ENCOUNTER — Other Ambulatory Visit: Payer: Self-pay

## 2020-04-30 ENCOUNTER — Ambulatory Visit
Admission: RE | Admit: 2020-04-30 | Discharge: 2020-04-30 | Disposition: A | Discharge status: Home / Self Care | Payer: Medicaid Other | Source: Ambulatory Visit | Attending: Radiation Oncology | Admitting: Radiation Oncology

## 2020-04-30 ENCOUNTER — Inpatient Hospital Stay: Payer: Medicaid Other

## 2020-04-30 DIAGNOSIS — C770 Secondary and unspecified malignant neoplasm of lymph nodes of head, face and neck: Secondary | ICD-10-CM | POA: Diagnosis not present

## 2020-04-30 DIAGNOSIS — Z5111 Encounter for antineoplastic chemotherapy: Secondary | ICD-10-CM | POA: Diagnosis not present

## 2020-04-30 DIAGNOSIS — C049 Malignant neoplasm of floor of mouth, unspecified: Secondary | ICD-10-CM

## 2020-04-30 DIAGNOSIS — Z452 Encounter for adjustment and management of vascular access device: Secondary | ICD-10-CM | POA: Diagnosis not present

## 2020-04-30 DIAGNOSIS — Z23 Encounter for immunization: Secondary | ICD-10-CM | POA: Diagnosis not present

## 2020-04-30 DIAGNOSIS — C023 Malignant neoplasm of anterior two-thirds of tongue, part unspecified: Secondary | ICD-10-CM

## 2020-04-30 MED ORDER — SODIUM CHLORIDE 0.9% FLUSH
10.0000 mL | INTRAVENOUS | Status: DC | PRN
  Administered 2020-04-30: 10 mL via INTRAVENOUS
  Filled 2020-04-30: qty 10

## 2020-04-30 MED ORDER — SODIUM CHLORIDE 0.9 % IV SOLN
Freq: Once | INTRAVENOUS | Status: AC
  Filled 2020-04-30: qty 1000

## 2020-04-30 MED ORDER — FLUCONAZOLE 100 MG PO TABS: ORAL_TABLET | ORAL | 0 refills | Status: DC

## 2020-04-30 MED ORDER — HEPARIN SOD (PORK) LOCK FLUSH 100 UNIT/ML IV SOLN
500.0000 [IU] | Freq: Once | INTRAVENOUS | Status: AC
  Administered 2020-04-30: 500 [IU] via INTRAVENOUS
  Filled 2020-04-30: qty 5

## 2020-05-01 ENCOUNTER — Ambulatory Visit
Admission: RE | Admit: 2020-05-01 | Discharge: 2020-05-01 | Disposition: A | Discharge status: Home / Self Care | Payer: Medicaid Other | Source: Ambulatory Visit | Attending: Radiation Oncology | Admitting: Radiation Oncology

## 2020-05-01 DIAGNOSIS — C049 Malignant neoplasm of floor of mouth, unspecified: Secondary | ICD-10-CM | POA: Diagnosis not present

## 2020-05-01 DIAGNOSIS — C023 Malignant neoplasm of anterior two-thirds of tongue, part unspecified: Secondary | ICD-10-CM | POA: Diagnosis not present

## 2020-05-02 ENCOUNTER — Other Ambulatory Visit: Payer: Self-pay

## 2020-05-02 ENCOUNTER — Ambulatory Visit
Admission: RE | Admit: 2020-05-02 | Discharge: 2020-05-02 | Disposition: A | Discharge status: Home / Self Care | Payer: Medicaid Other | Source: Ambulatory Visit | Attending: Radiation Oncology | Admitting: Radiation Oncology

## 2020-05-02 DIAGNOSIS — C023 Malignant neoplasm of anterior two-thirds of tongue, part unspecified: Secondary | ICD-10-CM | POA: Diagnosis not present

## 2020-05-02 DIAGNOSIS — C049 Malignant neoplasm of floor of mouth, unspecified: Secondary | ICD-10-CM | POA: Diagnosis not present

## 2020-05-03 ENCOUNTER — Inpatient Hospital Stay: Payer: Medicaid Other | Admitting: General Practice

## 2020-05-03 ENCOUNTER — Ambulatory Visit
Admission: RE | Admit: 2020-05-03 | Discharge: 2020-05-03 | Disposition: A | Discharge status: Home / Self Care | Payer: Medicaid Other | Source: Ambulatory Visit | Attending: Radiation Oncology | Admitting: Radiation Oncology

## 2020-05-03 ENCOUNTER — Inpatient Hospital Stay: Payer: Medicaid Other

## 2020-05-03 ENCOUNTER — Inpatient Hospital Stay: Payer: Medicaid Other | Admitting: Nutrition

## 2020-05-03 VITALS — BP 146/87 | HR 63 | Temp 98.1°F | Resp 18

## 2020-05-03 DIAGNOSIS — C023 Malignant neoplasm of anterior two-thirds of tongue, part unspecified: Secondary | ICD-10-CM

## 2020-05-03 DIAGNOSIS — C049 Malignant neoplasm of floor of mouth, unspecified: Secondary | ICD-10-CM | POA: Diagnosis not present

## 2020-05-03 DIAGNOSIS — Z23 Encounter for immunization: Secondary | ICD-10-CM | POA: Diagnosis not present

## 2020-05-03 DIAGNOSIS — Z452 Encounter for adjustment and management of vascular access device: Secondary | ICD-10-CM | POA: Diagnosis not present

## 2020-05-03 DIAGNOSIS — Z5111 Encounter for antineoplastic chemotherapy: Secondary | ICD-10-CM | POA: Diagnosis not present

## 2020-05-03 DIAGNOSIS — C770 Secondary and unspecified malignant neoplasm of lymph nodes of head, face and neck: Secondary | ICD-10-CM | POA: Diagnosis not present

## 2020-05-03 LAB — BASIC METABOLIC PANEL - CANCER CENTER ONLY
Anion gap: 8 (ref 5–15)
BUN: 18 mg/dL (ref 6–20)
CO2: 27 mmol/L (ref 22–32)
Calcium: 9.3 mg/dL (ref 8.9–10.3)
Chloride: 97 mmol/L — ABNORMAL LOW (ref 98–111)
Creatinine: 0.9 mg/dL (ref 0.61–1.24)
GFR, Est AFR Am: >60 mL/min (ref >60)
GFR, Estimated: >60 mL/min (ref >60)
Glucose, Bld: 75 mg/dL (ref 70–99)
Potassium: 4 mmol/L (ref 3.5–5.1)
Sodium: 132 mmol/L — ABNORMAL LOW (ref 135–145)

## 2020-05-03 LAB — CBC WITH DIFFERENTIAL (CANCER CENTER ONLY)
Abs Immature Granulocytes: 0.03 10*3/uL (ref 0.00–0.07)
Basophils Absolute: 0 10*3/uL (ref 0.0–0.1)
Basophils Relative: 0 %
Eosinophils Absolute: 0 10*3/uL (ref 0.0–0.5)
Eosinophils Relative: 0 %
HCT: 30.8 % — ABNORMAL LOW (ref 39.0–52.0)
Hemoglobin: 10.9 g/dL — ABNORMAL LOW (ref 13.0–17.0)
Immature Granulocytes: 1 %
Lymphocytes Relative: 14 %
Lymphs Abs: 0.8 10*3/uL (ref 0.7–4.0)
MCH: 32.4 pg (ref 26.0–34.0)
MCHC: 35.4 g/dL (ref 30.0–36.0)
MCV: 91.7 fL (ref 80.0–100.0)
Monocytes Absolute: 0.6 10*3/uL (ref 0.1–1.0)
Monocytes Relative: 10 %
Neutro Abs: 4.4 10*3/uL (ref 1.7–7.7)
Neutrophils Relative %: 75 %
Platelet Count: 269 10*3/uL (ref 150–400)
RBC: 3.36 MIL/uL — ABNORMAL LOW (ref 4.22–5.81)
RDW: 13.2 % (ref 11.5–15.5)
WBC Count: 5.8 10*3/uL (ref 4.0–10.5)
nRBC: 0 % (ref 0.0–0.2)

## 2020-05-03 MED ORDER — SODIUM CHLORIDE 0.9% FLUSH
10.0000 mL | Freq: Once | INTRAVENOUS | Status: AC
  Administered 2020-05-03: 10 mL via INTRAVENOUS
  Filled 2020-05-03: qty 10

## 2020-05-03 MED ORDER — SODIUM CHLORIDE 0.9 % IV SOLN
Freq: Once | INTRAVENOUS | Status: AC
  Filled 2020-05-03: qty 1000

## 2020-05-03 MED ORDER — HEPARIN SOD (PORK) LOCK FLUSH 100 UNIT/ML IV SOLN
500.0000 [IU] | Freq: Once | INTRAVENOUS | Status: AC
  Administered 2020-05-03: 500 [IU] via INTRAVENOUS
  Filled 2020-05-03: qty 5

## 2020-05-03 NOTE — Progress Notes (Signed)
Nutrition follow-up completed with patient during IV fluids secondary to floor of mouth and anterior tongue cancer. Weight documented as 129.2 pounds on September 27, stable from 129.5 pounds September 23. Patient denies nausea, vomiting, constipation, or diarrhea. He reports using 2 cartons of Osmolite 1.5 via PEG with 60 mL of free water before and after each bolus feeding. He continues to eat various soft foods such as salmon, meatloaf, and mashed potatoes. Reports his tube site is clean and his feeding tube is functioning without difficulty. He has recently had a delivery of formula and supplies.  Estimated nutrition needs: 1800-2100 cal, 90-105 g protein, 1.8 L fluid.  Nutrition diagnosis: Inadequate oral intake continues.  Intervention: Patient educated to increase Osmolite 1.5 when oral intake decreases. Continue 2 cartons daily along with soft foods. Encouraged weight stability/weight gain. Recommended patient add additional free water via tube if needed.  6 cartons of Osmolite 1.5 provides 2130 cal, 89.4 g protein, 2286 mL of free water meeting greater than 90% estimated needs.  Monitoring, evaluation, goals: Patient will tolerate oral intake with tube feeding to minimize weight loss.  Next visit: Thursday, October 7 during IV fluids.  **Disclaimer: This note was dictated with voice recognition software. Similar sounding words can inadvertently be transcribed and this note may contain transcription errors which may not have been corrected upon publication of note.**

## 2020-05-03 NOTE — Patient Instructions (Signed)

## 2020-05-03 NOTE — Progress Notes (Signed)
[place] CSW Progress Notes  Met w patient in infusion to check in.  Reports he is doing overall well, some days are better than others.  Is feeling encouraged as he sees good progress towards completing treatments.  He reports his weight is stable and he is using his feeding tube and taking food by mouth.  His spirits are good.  He has turned in paperwork needed for [employer], asked if Vincente Liberty could notify him when grant is active.  He may need to use it for medications as he remains uninsured.  No other concerns at this time.  Edwyna Shell, LCSW Clinical Social Worker Phone:  [phone]

## 2020-05-04 ENCOUNTER — Other Ambulatory Visit: Payer: Self-pay

## 2020-05-04 ENCOUNTER — Ambulatory Visit
Admission: RE | Admit: 2020-05-04 | Discharge: 2020-05-04 | Disposition: A | Discharge status: Home / Self Care | Payer: Medicaid Other | Source: Ambulatory Visit | Attending: Radiation Oncology | Admitting: Radiation Oncology

## 2020-05-04 ENCOUNTER — Inpatient Hospital Stay: Payer: Medicaid Other | Attending: Hematology

## 2020-05-04 VITALS — BP 138/79 | HR 68 | Temp 98.1°F | Resp 17

## 2020-05-04 DIAGNOSIS — C049 Malignant neoplasm of floor of mouth, unspecified: Secondary | ICD-10-CM | POA: Insufficient documentation

## 2020-05-04 DIAGNOSIS — C023 Malignant neoplasm of anterior two-thirds of tongue, part unspecified: Secondary | ICD-10-CM | POA: Diagnosis not present

## 2020-05-04 DIAGNOSIS — C770 Secondary and unspecified malignant neoplasm of lymph nodes of head, face and neck: Secondary | ICD-10-CM | POA: Insufficient documentation

## 2020-05-04 DIAGNOSIS — Z5111 Encounter for antineoplastic chemotherapy: Secondary | ICD-10-CM | POA: Insufficient documentation

## 2020-05-04 DIAGNOSIS — R630 Anorexia: Secondary | ICD-10-CM | POA: Diagnosis not present

## 2020-05-04 MED ORDER — HEPARIN SOD (PORK) LOCK FLUSH 100 UNIT/ML IV SOLN
500.0000 [IU] | Freq: Once | INTRAVENOUS | Status: AC
  Administered 2020-05-04: 500 [IU] via INTRAVENOUS
  Filled 2020-05-04: qty 5

## 2020-05-04 MED ORDER — SODIUM CHLORIDE 0.9 % IV SOLN
Freq: Once | INTRAVENOUS | Status: DC
  Filled 2020-05-04: qty 1000

## 2020-05-04 MED ORDER — SODIUM CHLORIDE 0.9% FLUSH
10.0000 mL | INTRAVENOUS | Status: DC | PRN
  Administered 2020-05-04: 10 mL via INTRAVENOUS
  Filled 2020-05-04: qty 10

## 2020-05-04 MED ORDER — SODIUM CHLORIDE 0.9 % IV SOLN
Freq: Once | INTRAVENOUS | Status: AC
  Filled 2020-05-04: qty 1000

## 2020-05-04 MED ORDER — POTASSIUM CHLORIDE 10 MEQ/100ML IV SOLN
10.0000 meq | Freq: Once | INTRAVENOUS | Status: AC
  Administered 2020-05-04: 10 meq via INTRAVENOUS

## 2020-05-04 MED ORDER — POTASSIUM CHLORIDE 10 MEQ/100ML IV SOLN
INTRAVENOUS | Status: AC
  Filled 2020-05-04: qty 100

## 2020-05-07 ENCOUNTER — Inpatient Hospital Stay: Payer: Medicaid Other

## 2020-05-07 ENCOUNTER — Ambulatory Visit
Admission: RE | Admit: 2020-05-07 | Discharge: 2020-05-07 | Disposition: A | Discharge status: Home / Self Care | Payer: Medicaid Other | Source: Ambulatory Visit | Attending: Radiation Oncology | Admitting: Radiation Oncology

## 2020-05-07 ENCOUNTER — Other Ambulatory Visit: Payer: Self-pay | Admitting: Radiation Oncology

## 2020-05-07 ENCOUNTER — Other Ambulatory Visit: Payer: Self-pay

## 2020-05-07 VITALS — BP 117/68 | HR 54 | Temp 98.2°F | Resp 16

## 2020-05-07 DIAGNOSIS — C049 Malignant neoplasm of floor of mouth, unspecified: Secondary | ICD-10-CM | POA: Diagnosis not present

## 2020-05-07 DIAGNOSIS — R630 Anorexia: Secondary | ICD-10-CM | POA: Diagnosis not present

## 2020-05-07 DIAGNOSIS — C023 Malignant neoplasm of anterior two-thirds of tongue, part unspecified: Secondary | ICD-10-CM | POA: Diagnosis not present

## 2020-05-07 DIAGNOSIS — Z5111 Encounter for antineoplastic chemotherapy: Secondary | ICD-10-CM | POA: Diagnosis not present

## 2020-05-07 DIAGNOSIS — C770 Secondary and unspecified malignant neoplasm of lymph nodes of head, face and neck: Secondary | ICD-10-CM | POA: Diagnosis not present

## 2020-05-07 DIAGNOSIS — Z95828 Presence of other vascular implants and grafts: Secondary | ICD-10-CM

## 2020-05-07 MED ORDER — FENTANYL 25 MCG/HR TD PT72: 1.0000 | MEDICATED_PATCH | TRANSDERMAL | 0 refills | Status: DC

## 2020-05-07 MED ORDER — HEPARIN SOD (PORK) LOCK FLUSH 100 UNIT/ML IV SOLN
500.0000 [IU] | Freq: Once | INTRAVENOUS | Status: AC
  Administered 2020-05-07: 500 [IU] via INTRAVENOUS
  Filled 2020-05-07: qty 5

## 2020-05-07 MED ORDER — SODIUM CHLORIDE 0.9 % IV SOLN
Freq: Once | INTRAVENOUS | Status: AC
  Filled 2020-05-07: qty 1000

## 2020-05-07 MED ORDER — SODIUM CHLORIDE 0.9% FLUSH
10.0000 mL | Freq: Once | INTRAVENOUS | Status: AC
  Administered 2020-05-07: 10 mL via INTRAVENOUS
  Filled 2020-05-07: qty 10

## 2020-05-07 NOTE — Patient Instructions (Signed)

## 2020-05-07 NOTE — Progress Notes (Signed)
The patient was seen by me today and he reported 10 out of 10 pain.  (Please see under treatment note for further details on examination, etc.). He is taking oxycodone but is not on any long-acting pain medication.  He does not see medical oncology until next week.  We will try fentanyl 25 mcg patches.  We talked about the potential side effects of this medication and reasons to stop it if it is poorly tolerated.  I have made a prescription for 2 patches which will last him for 6 days.  I will notify Dr. Lindi Adie as he is managing the patient's long-term narcotic prescriptions.  I will asked Dr. Lindi Adie to refill his fentanyl at the end of the week at the dose that seems appropriate, based on how the patient's pain is controlled.  Patient is pleased with this plan.    ICD-10-CM   1. 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

## 2020-05-08 ENCOUNTER — Telehealth: Payer: Self-pay | Admitting: *Deleted

## 2020-05-08 ENCOUNTER — Other Ambulatory Visit: Payer: Self-pay | Admitting: Hematology and Oncology

## 2020-05-08 ENCOUNTER — Ambulatory Visit
Admission: RE | Admit: 2020-05-08 | Discharge: 2020-05-08 | Disposition: A | Discharge status: Home / Self Care | Payer: Medicaid Other | Source: Ambulatory Visit | Attending: Radiation Oncology | Admitting: Radiation Oncology

## 2020-05-08 DIAGNOSIS — C023 Malignant neoplasm of anterior two-thirds of tongue, part unspecified: Secondary | ICD-10-CM | POA: Diagnosis not present

## 2020-05-08 DIAGNOSIS — C049 Malignant neoplasm of floor of mouth, unspecified: Secondary | ICD-10-CM | POA: Diagnosis not present

## 2020-05-08 MED ORDER — MORPHINE SULFATE (CONCENTRATE) 20 MG/ML PO SOLN: 20.0000 mg | ORAL | 0 refills | Status: DC | PRN

## 2020-05-08 MED FILL — MORPHINE SULF 100 MG/5 ML S: 100 | 40 days supply | Qty: 240 | Fill #0

## 2020-05-08 MED FILL — FENTANYL 25 MCG/HR PT72: 25 | 15 days supply | Qty: 5 | Fill #0

## 2020-05-08 NOTE — Progress Notes (Signed)
Oncology Nurse Navigator Documentation  I received several phone calls and emails from Casey [name] and his sister Casey [name] yesterday regarding pain control due to mouth sores related to current radiation and chemotherapy treatment. [name] prescribed 25 mcg Fentanyl patches, but they were unable to pick them up from the pharmacy yesterday . I have informed both Casey [name] and Casey [name] that the fentanyl patches are available at [facility] at this time as well as Morphine concentrated solution for breakthrough pain prescribed by Dr. Lindi Adie today. I instructed them on the use of both medications and they voiced understanding. Casey [name] also voices difficulty with thick secretions. I have advised him to try the scopolamine patches prescribed by [name] and keep using the baking soda rinses that have been recommended. They voiced appreciation for my assistance and know to call me if they have any further questions or concerns.   [name] RN, BSN, OCN Head & Neck Oncology Nurse [place] at [facility] Phone # [phone]  Fax # [phone]

## 2020-05-08 NOTE — Telephone Encounter (Signed)
Received a call from Dr.Squire's nurse stating pt sister had concerns that pt no longer could swallow pills due to swelling and pain in his mouth. Dr.Squire prescribe fentanyl 25 mcg. Dr.Gudena made aware, order for morphine has been sent to pt preferred pharmacy. Pt called and notified that prescription has been sent in to pharmacy. Pt verbalized understanding.

## 2020-05-09 ENCOUNTER — Ambulatory Visit
Admission: RE | Admit: 2020-05-09 | Discharge: 2020-05-09 | Disposition: A | Discharge status: Home / Self Care | Payer: Medicaid Other | Source: Ambulatory Visit | Attending: Radiation Oncology | Admitting: Radiation Oncology

## 2020-05-09 ENCOUNTER — Other Ambulatory Visit: Payer: Self-pay | Admitting: Radiation Oncology

## 2020-05-09 DIAGNOSIS — C049 Malignant neoplasm of floor of mouth, unspecified: Secondary | ICD-10-CM

## 2020-05-09 DIAGNOSIS — C023 Malignant neoplasm of anterior two-thirds of tongue, part unspecified: Secondary | ICD-10-CM

## 2020-05-09 MED ORDER — LIDOCAINE VISCOUS HCL 2 % MT SOLN: OROMUCOSAL | 4 refills | Status: DC

## 2020-05-09 MED ORDER — SCOPOLAMINE 1 MG/3DAYS TD PT72: 1.0000 | MEDICATED_PATCH | TRANSDERMAL | 3 refills | Status: DC

## 2020-05-09 MED FILL — LIDOCAINE 2% VISCOUS SOLN: 2 | 3 days supply | Qty: 200 | Fill #0

## 2020-05-09 MED FILL — SCOPOLAMINE 1 MG/3 DAY PATC: 1 | 30 days supply | Qty: 10 | Fill #0

## 2020-05-10 ENCOUNTER — Inpatient Hospital Stay: Payer: Medicaid Other

## 2020-05-10 ENCOUNTER — Other Ambulatory Visit: Payer: Self-pay

## 2020-05-10 ENCOUNTER — Inpatient Hospital Stay: Payer: Medicaid Other | Admitting: Nutrition

## 2020-05-10 ENCOUNTER — Ambulatory Visit
Admission: RE | Admit: 2020-05-10 | Discharge: 2020-05-10 | Disposition: A | Discharge status: Home / Self Care | Payer: Medicaid Other | Source: Ambulatory Visit | Attending: Radiation Oncology | Admitting: Radiation Oncology

## 2020-05-10 VITALS — BP 129/71 | HR 60 | Temp 97.8°F | Resp 18

## 2020-05-10 DIAGNOSIS — C770 Secondary and unspecified malignant neoplasm of lymph nodes of head, face and neck: Secondary | ICD-10-CM | POA: Diagnosis not present

## 2020-05-10 DIAGNOSIS — C049 Malignant neoplasm of floor of mouth, unspecified: Secondary | ICD-10-CM

## 2020-05-10 DIAGNOSIS — R630 Anorexia: Secondary | ICD-10-CM | POA: Diagnosis not present

## 2020-05-10 DIAGNOSIS — C023 Malignant neoplasm of anterior two-thirds of tongue, part unspecified: Secondary | ICD-10-CM | POA: Diagnosis not present

## 2020-05-10 DIAGNOSIS — Z5111 Encounter for antineoplastic chemotherapy: Secondary | ICD-10-CM | POA: Diagnosis not present

## 2020-05-10 DIAGNOSIS — Z95828 Presence of other vascular implants and grafts: Secondary | ICD-10-CM

## 2020-05-10 LAB — CBC WITH DIFFERENTIAL (CANCER CENTER ONLY)
Abs Immature Granulocytes: 0.02 10*3/uL (ref 0.00–0.07)
Basophils Absolute: 0 10*3/uL (ref 0.0–0.1)
Basophils Relative: 0 %
Eosinophils Absolute: 0 10*3/uL (ref 0.0–0.5)
Eosinophils Relative: 0 %
HCT: 29.1 % — ABNORMAL LOW (ref 39.0–52.0)
Hemoglobin: 9.9 g/dL — ABNORMAL LOW (ref 13.0–17.0)
Immature Granulocytes: 1 %
Lymphocytes Relative: 15 %
Lymphs Abs: 0.5 10*3/uL — ABNORMAL LOW (ref 0.7–4.0)
MCH: 32.5 pg (ref 26.0–34.0)
MCHC: 34 g/dL (ref 30.0–36.0)
MCV: 95.4 fL (ref 80.0–100.0)
Monocytes Absolute: 0.8 10*3/uL (ref 0.1–1.0)
Monocytes Relative: 24 %
Neutro Abs: 2.1 10*3/uL (ref 1.7–7.7)
Neutrophils Relative %: 60 %
Platelet Count: 217 10*3/uL (ref 150–400)
RBC: 3.05 MIL/uL — ABNORMAL LOW (ref 4.22–5.81)
RDW: 13.5 % (ref 11.5–15.5)
WBC Count: 3.5 10*3/uL — ABNORMAL LOW (ref 4.0–10.5)
nRBC: 0 % (ref 0.0–0.2)

## 2020-05-10 LAB — BASIC METABOLIC PANEL - CANCER CENTER ONLY
Anion gap: 4 — ABNORMAL LOW (ref 5–15)
BUN: 15 mg/dL (ref 6–20)
CO2: 31 mmol/L (ref 22–32)
Calcium: 9.9 mg/dL (ref 8.9–10.3)
Chloride: 100 mmol/L (ref 98–111)
Creatinine: 0.85 mg/dL (ref 0.61–1.24)
GFR, Estimated: >60 mL/min (ref >60)
Glucose, Bld: 90 mg/dL (ref 70–99)
Potassium: 4.5 mmol/L (ref 3.5–5.1)
Sodium: 135 mmol/L (ref 135–145)

## 2020-05-10 MED ORDER — SODIUM CHLORIDE 0.9% FLUSH
10.0000 mL | INTRAVENOUS | Status: DC | PRN
  Filled 2020-05-10: qty 10

## 2020-05-10 MED ORDER — SODIUM CHLORIDE 0.9 % IV SOLN
Freq: Once | INTRAVENOUS | Status: AC
  Filled 2020-05-10: qty 250

## 2020-05-10 MED ORDER — SODIUM CHLORIDE 0.9% FLUSH
10.0000 mL | Freq: Once | INTRAVENOUS | Status: AC
  Administered 2020-05-10: 10 mL via INTRAVENOUS
  Filled 2020-05-10: qty 10

## 2020-05-10 MED ORDER — HEPARIN SOD (PORK) LOCK FLUSH 100 UNIT/ML IV SOLN
500.0000 [IU] | Freq: Once | INTRAVENOUS | Status: AC
  Administered 2020-05-10: 500 [IU] via INTRAVENOUS
  Filled 2020-05-10: qty 5

## 2020-05-10 MED ORDER — SONAFINE EX EMUL
1.0000 "application " | Freq: Two times a day (BID) | CUTANEOUS | Status: DC
  Administered 2020-05-10: 1 via TOPICAL

## 2020-05-10 MED ORDER — HEPARIN SOD (PORK) LOCK FLUSH 100 UNIT/ML IV SOLN
500.0000 [IU] | Freq: Once | INTRAVENOUS | Status: DC
  Filled 2020-05-10: qty 5

## 2020-05-10 NOTE — Progress Notes (Signed)
Nutrition follow-up completed with patient during IV fluids secondary to floor of mouth and anterior tongue cancer. Last weight documented was 126.8 pounds October 4 decreased from 135 pounds August 23. Noted sodium 132 on September 30. Patient reports mouth sores and thickened saliva. He is now on pain medication and is feeling better. He is not able to eat or drink by mouth. He is using 4 cartons of Osmolite 1.5 via PEG. He reports constipation.  Estimated nutrition needs: 1800-2100 cal, 90-105 g protein, 1.8 L fluid.  Nutrition diagnosis: Inadequate oral intake continues.  Intervention: Educated patient on importance of increasing Osmolite 1.5-6 cartons daily to provide 2130 cal, 89.4 g protein, 2208 86 mL free water meeting greater than 90% estimated needs. Patient to continue to try to drink water or eat ice chips by mouth. Continue additional free water through feeding tube. Encouraged bowel regimen.  Monitoring, evaluation, goals: Patient will tolerate increased tube feeding to goal rate to minimize further weight loss.  Next visit: Thursday, October 21 during infusion.  **Disclaimer: This note was dictated with voice recognition software. Similar sounding words can inadvertently be transcribed and this note may contain transcription errors which may not have been corrected upon publication of note.**

## 2020-05-10 NOTE — Patient Instructions (Signed)
Rehydration, Adult Rehydration is the replacement of body fluids and salts and minerals (electrolytes) that are lost during dehydration. Dehydration is when there is not enough fluid or water in the body. This happens when you lose more fluids than you take in. Common causes of dehydration include:  Vomiting.  Diarrhea.  Excessive sweating, such as from heat exposure or exercise.  Taking medicines that cause the body to lose excess fluid (diuretics).  Impaired kidney function.  Not drinking enough fluid.  Certain illnesses or infections.  Certain poorly controlled long-term (chronic) illnesses, such as diabetes, heart disease, and kidney disease.  Symptoms of mild dehydration may include thirst, dry lips and mouth, dry skin, and dizziness. Symptoms of severe dehydration may include increased heart rate, confusion, fainting, and not urinating. You can rehydrate by drinking certain fluids or getting fluids through an IV tube, as told by your health care provider. What are the risks? Generally, rehydration is safe. However, one problem that can happen is taking in too much fluid (overhydration). This is rare. If overhydration happens, it can cause an electrolyte imbalance, kidney failure, or a decrease in salt (sodium) levels in the body. How to rehydrate Follow instructions from your health care provider for rehydration. The kind of fluid you should drink and the amount you should drink depend on your condition.  If directed by your health care provider, drink an oral rehydration solution (ORS). This is a drink designed to treat dehydration that is found in pharmacies and retail stores. ? Make an ORS by following instructions on the package. ? Start by drinking small amounts, about  cup (120 mL) every 5-10 minutes. ? Slowly increase how much you drink until you have taken the amount recommended by your health care provider.  Drink enough clear fluids to keep your urine clear or pale  yellow. If you were instructed to drink an ORS, finish the ORS first, then start slowly drinking other clear fluids. Drink fluids such as: ? Water. Do not drink only water. Doing that can lead to having too little sodium in your body (hyponatremia). ? Ice chips. ? Fruit juice that you have added water to (diluted juice). ? Low-calorie sports drinks.  If you are severely dehydrated, your health care provider may recommend that you receive fluids through an IV tube in the hospital.  Do not take sodium tablets. Doing that can lead to the condition of having too much sodium in your body (hypernatremia). Eating while you rehydrate Follow instructions from your health care provider about what to eat while you rehydrate. Your health care provider may recommend that you slowly begin eating regular foods in small amounts.  Eat foods that contain a healthy balance of electrolytes, such as bananas, oranges, potatoes, tomatoes, and spinach.  Avoid foods that are greasy or contain a lot of fat or sugar.  In some cases, you may get nutrition through a feeding tube that is passed through your nose and into your stomach (nasogastric tube, or NG tube). This may be done if you have uncontrolled vomiting or diarrhea. Beverages to avoid Certain beverages may make dehydration worse. While you rehydrate, avoid:  Alcohol.  Caffeine.  Drinks that contain a lot of sugar. These include: ? High-calorie sports drinks. ? Fruit juice that is not diluted. ? Soda.  Check nutrition labels to see how much sugar or caffeine a beverage contains. Signs of dehydration recovery You may be recovering from dehydration if:  You are urinating more often than before you started  rehydrating.  Your urine is clear or pale yellow.  Your energy level improves.  You vomit less frequently.  You have diarrhea less frequently.  Your appetite improves or returns to normal.  You feel less dizzy or less light-headed.  Your  skin tone and color start to look more normal. Contact a health care provider if:  You continue to have symptoms of mild dehydration, such as: ? Thirst. ? Dry lips. ? Slightly dry mouth. ? Dry, warm skin. ? Dizziness.  You continue to vomit or have diarrhea. Get help right away if:  You have symptoms of dehydration that get worse.  You feel: ? Confused. ? Weak. ? Like you are going to faint.  You have not urinated in 6-8 hours.  You have very dark urine.  You have trouble breathing.  Your heart rate while sitting still is over 100 beats a minute.  You cannot drink fluids without vomiting.  You have vomiting or diarrhea that: ? Gets worse. ? Does not go away.  You have a fever. This information is not intended to replace advice given to you by your health care provider. Make sure you discuss any questions you have with your health care provider. Document Revised: 07/03/2017 Document Reviewed: 09/14/2015 Elsevier Patient Education  [place].   Constipation, Adult Constipation is when a person:  Poops (has a bowel movement) fewer times in a week than normal.  Has a hard time pooping.  Has poop that is dry, hard, or bigger than normal. Follow these instructions at home: Eating and drinking   Eat foods that have a lot of fiber, such as: ? Fresh fruits and vegetables. ? Whole grains. ? Beans.  Eat less of foods that are high in fat, low in fiber, or overly processed, such as: ? [place] fries. ? Hamburgers. ? Cookies. ? Candy. ? Soda.  Drink enough fluid to keep your pee (urine) clear or pale yellow. General instructions  Exercise regularly or as told by your doctor.  Go to the restroom when you feel like you need to poop. Do not hold it in.  Take over-the-counter and prescription medicines only as told by your doctor. These include any fiber supplements.  Do pelvic floor retraining exercises, such as: ? Doing deep breathing while relaxing  your lower belly (abdomen). ? Relaxing your pelvic floor while pooping.  Watch your condition for any changes.  Keep all follow-up visits as told by your doctor. This is important. Contact a doctor if:  You have pain that gets worse.  You have a fever.  You have not pooped for 4 days.  You throw up (vomit).  You are not hungry.  You lose weight.  You are bleeding from the anus.  You have thin, pencil-like poop (stool). Get help right away if:  You have a fever, and your symptoms suddenly get worse.  You leak poop or have blood in your poop.  Your belly feels hard or bigger than normal (is bloated).  You have very bad belly pain.  You feel dizzy or you faint. This information is not intended to replace advice given to you by your health care provider. Make sure you discuss any questions you have with your health care provider. Document Revised: 07/03/2017 Document Reviewed: 01/09/2016 Elsevier Patient Education  [place].   Constipation, Adult Constipation is when a person has fewer bowel movements in a week than normal, has difficulty having a bowel movement, or has stools that are dry,  hard, or larger than normal. Constipation may be caused by an underlying condition. It may become worse with age if a person takes certain medicines and does not take in enough fluids. Follow these instructions at home: Eating and drinking   Eat foods that have a lot of fiber, such as fresh fruits and vegetables, whole grains, and beans.  Limit foods that are high in fat, low in fiber, or overly processed, such as french fries, hamburgers, cookies, candies, and soda.  Drink enough fluid to keep your urine clear or pale yellow. General instructions  Exercise regularly or as told by your health care provider.  Go to the restroom when you have the urge to go. Do not hold it in.  Take over-the-counter and prescription medicines only as told by your health care provider.  These include any fiber supplements.  Practice pelvic floor retraining exercises, such as deep breathing while relaxing the lower abdomen and pelvic floor relaxation during bowel movements.  Watch your condition for any changes.  Keep all follow-up visits as told by your health care provider. This is important. Contact a health care provider if:  You have pain that gets worse.  You have a fever.  You do not have a bowel movement after 4 days.  You vomit.  You are not hungry.  You lose weight.  You are bleeding from the anus.  You have thin, pencil-like stools. Get help right away if:  You have a fever and your symptoms suddenly get worse.  You leak stool or have blood in your stool.  Your abdomen is bloated.  You have severe pain in your abdomen.  You feel dizzy or you faint. This information is not intended to replace advice given to you by your health care provider. Make sure you discuss any questions you have with your health care provider. Document Revised: 07/03/2017 Document Reviewed: 01/09/2016 Elsevier Patient Education  2020 [place].   Chronic Constipation  Chronic constipation is a condition in which a person has three or fewer bowel movements a week, for three months or longer. This condition is especially common in older adults. The two main kinds of chronic constipation are secondary constipation and functional constipation. Secondary constipation results from another condition or a treatment. Functional constipation, also called primary or idiopathic constipation, is divided into three types:  Normal transit constipation. In this type, movement of stool through the colon (stool transit) occurs normally.  Slow transit constipation. In this type, stool moves slowly through the colon.  Outlet constipation or pelvic floor dysfunction. In this type, the nerves and muscles that empty the rectum do not work normally. What are the causes? Causes of  secondary constipation may include:  Failing to drink enough fluid, eat enough food or fiber, or get physically active.  Pregnancy.  A tear in the anus (anal fissure).  Blockage in the bowel (bowel obstruction).  Narrowing of the bowel (bowel stricture).  Having a long-term medical condition, such as: ? Diabetes. ? Hypothyroidism. ? Multiple sclerosis. ? Parkinson disease. ? Stroke. ? Spinal cord injury. ? Dementia. ? Colon cancer. ? Inflammatory bowel disease (IBD). ? Iron-deficiency anemia. ? Outward collapse of the rectum (rectal prolapse). ? Hemorrhoids.  Taking certain medicines, including: ? Narcotics. These are a certain type of prescription pain medicine. ? Antacids. ? Iron supplements. ? Water pills (diuretics). ? Certain blood pressure medicines. ? Anti-seizure medicines. ? Antidepressants. ? Medicines for Parkinson disease. The cause of functional constipation is not known, but some  conditions are associated with it. These conditions include:  Stress.  Problems in the nerves and muscles that control stool transit.  Weak or impaired pelvic floor muscles. What increases the risk? You may be at higher risk for chronic constipation if you:  Are older than age 80.  Are male.  Live in a long-term care facility.  Do not get much exercise or physical activity (have a sedentary lifestyle).  Do not drink enough fluids.  Do not eat enough food, especially fiber.  Have a long-term disease.  Have a mental health disorder or eating disorder.  Take many medicines. What are the signs or symptoms? The main symptom of chronic constipation is having three or fewer bowel movements a week for several weeks. Other signs and symptoms may vary from person to person. These include:  Pushing hard (straining) to pass stool.  Painful bowel movements.  Having hard or lumpy stools.  Having lower belly discomfort, such as cramps or bloating.  Being unable to  have a bowel movement when you feel the urge.  Feeling like you still need to pass stool after a bowel movement.  Feeling that you have something in your rectum that is blocking or preventing bowel movements.  Seeing blood on the toilet paper or in your stool.  Worsening confusion (in older adults). How is this diagnosed? This condition may be diagnosed based on:  Symptoms and medical history. You will be asked about your symptoms, lifestyle, diet, and any medicines that you are taking.  Physical exam. ? Your belly (abdomen) will be examined. ? A digital rectal exam may be done. For this exam, a health care provider places a lubricated, gloved finger into the rectum.  Other tests to check for any underlying causes of your constipation. These may be ordered if you have bleeding in your rectum, weight loss, or a family history of colon cancer. In these cases, you may have: ? Imaging studies of the colon. These may include X-ray, ultrasound, or CT scan. ? Blood tests. ? A procedure to examine the inside of your colon (colonoscopy). ? More specialized tests to check:  Whether your anal sphincter works well. This is a ring-shaped muscle that controls the closing of the anus.  How well food moves through your colon. ? Tests to measure the nerve signal in your pelvic floor muscles (electromyography). How is this treated? Treatment for chronic constipation depends on the cause. Most often, treatment starts with:  Being more active and getting regular exercise.  Drinking more fluids.  Adding fiber to your diet. Sources of fiber include fruits, vegetables, whole grains, and fiber supplements.  Using medicines such as stool softeners or medicines that increase contractions in your digestive system (pro-motility agents).  Training your pelvic muscles with biofeedback.  Surgery, if there is obstruction. Treatment for secondary chronic constipation depends on the underlying condition. You  may need to:  Stop or change some medicines if they cause constipation.  Use a fiber supplement (bulk laxative) or stool softener.  Use prescription laxative. This works by [employer] into your colon (osmotic laxative). You may also need to see a specialist who treats conditions of the digestive system (gastroenterologist). Follow these instructions at home:   Take over-the-counter and prescription medicines only as told by your health care provider.  If you are taking a laxative, take it as told by your health care provider.  Eat a balanced diet that includes enough fiber. Ask your health care provider to recommend a diet  that is right for you.  Drink clear fluids, especially water. Avoid drinking alcohol, caffeine, and soda.  Drink enough fluid to keep your urine pale yellow.  Get some physical activity every day. Ask your health care provider what physical activities are safe for you.  Get colon cancer screenings as told by your health care provider.  Keep all follow-up visits as told by your health care provider. This is important. Contact a health care provider if:  You are having three or fewer bowel movements a week.  Your stools are hard or lumpy.  You notice blood on the toilet paper or in your stool after you have a bowel movement.  You have unexplained weight loss.  You have rectum (rectal) pain.  You have stool leakage.  You experience nausea or vomiting. Get help right away if:  You have rectal bleeding or you pass blood clots.  You have severe rectal pain.  You have body tissue that pushes out (protrudes) from your anus.  You have severe pain or bloating (distension) in your abdomen.  You have vomiting that you cannot control. Summary  Chronic constipation is a condition in which a person has three or fewer bowel movements a week, for three months or longer.  You may have a higher risk for this condition if you are an older adult, or if you  do not drink enough water or get enough physical activity (are sedentary).  Treatment for this condition depends on the cause. Most treatments for chronic constipation include adding fiber to your diet, drinking more fluids, and getting more physical activity. You may also need to treat any underlying medical conditions or stop or change certain medicines if they cause constipation.  If lifestyle changes do not relieve constipation, your health care provider may recommend taking a laxative. This information is not intended to replace advice given to you by your health care provider. Make sure you discuss any questions you have with your health care provider. Document Revised: 07/03/2017 Document Reviewed: 04/07/2017 Elsevier Patient Education  [place].

## 2020-05-11 ENCOUNTER — Other Ambulatory Visit: Payer: Self-pay

## 2020-05-11 ENCOUNTER — Telehealth: Payer: Self-pay | Admitting: Hematology and Oncology

## 2020-05-11 ENCOUNTER — Ambulatory Visit
Admission: RE | Admit: 2020-05-11 | Discharge: 2020-05-11 | Disposition: A | Discharge status: Home / Self Care | Payer: Medicaid Other | Source: Ambulatory Visit | Attending: Radiation Oncology | Admitting: Radiation Oncology

## 2020-05-11 ENCOUNTER — Inpatient Hospital Stay: Payer: Medicaid Other

## 2020-05-11 VITALS — BP 117/76 | HR 69 | Temp 100.2°F | Resp 18

## 2020-05-11 DIAGNOSIS — R630 Anorexia: Secondary | ICD-10-CM | POA: Diagnosis not present

## 2020-05-11 DIAGNOSIS — Z5111 Encounter for antineoplastic chemotherapy: Secondary | ICD-10-CM | POA: Diagnosis not present

## 2020-05-11 DIAGNOSIS — C049 Malignant neoplasm of floor of mouth, unspecified: Secondary | ICD-10-CM | POA: Diagnosis not present

## 2020-05-11 DIAGNOSIS — C023 Malignant neoplasm of anterior two-thirds of tongue, part unspecified: Secondary | ICD-10-CM | POA: Diagnosis not present

## 2020-05-11 DIAGNOSIS — Z95828 Presence of other vascular implants and grafts: Secondary | ICD-10-CM

## 2020-05-11 DIAGNOSIS — C770 Secondary and unspecified malignant neoplasm of lymph nodes of head, face and neck: Secondary | ICD-10-CM | POA: Diagnosis not present

## 2020-05-11 MED ORDER — SODIUM CHLORIDE 0.9% FLUSH
10.0000 mL | Freq: Once | INTRAVENOUS | Status: AC
  Administered 2020-05-11: 10 mL via INTRAVENOUS
  Filled 2020-05-11: qty 10

## 2020-05-11 MED ORDER — HEPARIN SOD (PORK) LOCK FLUSH 100 UNIT/ML IV SOLN
500.0000 [IU] | Freq: Once | INTRAVENOUS | Status: AC
  Administered 2020-05-11: 500 [IU] via INTRAVENOUS
  Filled 2020-05-11: qty 5

## 2020-05-11 MED ORDER — SODIUM CHLORIDE 0.9 % IV SOLN
Freq: Once | INTRAVENOUS | Status: AC
  Filled 2020-05-11: qty 1000

## 2020-05-11 NOTE — Telephone Encounter (Signed)
/  oved 10/11 infusion to earlier time per [employer]. Called and spoke with patient and confirmed with transportation as well

## 2020-05-11 NOTE — Patient Instructions (Signed)

## 2020-05-14 ENCOUNTER — Ambulatory Visit
Admission: RE | Admit: 2020-05-14 | Discharge: 2020-05-14 | Disposition: A | Discharge status: Home / Self Care | Payer: Medicaid Other | Source: Ambulatory Visit | Attending: Radiation Oncology | Admitting: Radiation Oncology

## 2020-05-14 ENCOUNTER — Inpatient Hospital Stay: Payer: Medicaid Other

## 2020-05-14 ENCOUNTER — Other Ambulatory Visit: Payer: Self-pay | Admitting: Radiation Oncology

## 2020-05-14 ENCOUNTER — Other Ambulatory Visit: Payer: Self-pay

## 2020-05-14 VITALS — BP 129/91 | HR 68 | Temp 98.9°F | Resp 18

## 2020-05-14 DIAGNOSIS — Z5111 Encounter for antineoplastic chemotherapy: Secondary | ICD-10-CM | POA: Diagnosis not present

## 2020-05-14 DIAGNOSIS — C023 Malignant neoplasm of anterior two-thirds of tongue, part unspecified: Secondary | ICD-10-CM | POA: Diagnosis not present

## 2020-05-14 DIAGNOSIS — R630 Anorexia: Secondary | ICD-10-CM | POA: Diagnosis not present

## 2020-05-14 DIAGNOSIS — C770 Secondary and unspecified malignant neoplasm of lymph nodes of head, face and neck: Secondary | ICD-10-CM | POA: Diagnosis not present

## 2020-05-14 DIAGNOSIS — C049 Malignant neoplasm of floor of mouth, unspecified: Secondary | ICD-10-CM | POA: Diagnosis not present

## 2020-05-14 MED ORDER — HEPARIN SOD (PORK) LOCK FLUSH 100 UNIT/ML IV SOLN
500.0000 [IU] | Freq: Once | INTRAVENOUS | Status: AC
  Administered 2020-05-14: 500 [IU] via INTRAVENOUS
  Filled 2020-05-14: qty 5

## 2020-05-14 MED ORDER — METOCLOPRAMIDE HCL 5 MG/5ML PO SOLN: 10.0000 mg | Freq: Three times a day (TID) | ORAL | 4 refills | Status: DC

## 2020-05-14 MED ORDER — SODIUM CHLORIDE 0.9 % IV SOLN
Freq: Once | INTRAVENOUS | Status: AC
  Filled 2020-05-14: qty 1000

## 2020-05-14 MED ORDER — POLYETHYLENE GLYCOL 3350 17 G PO PACK: PACK | ORAL | 1 refills | Status: DC

## 2020-05-14 MED ORDER — SODIUM CHLORIDE 0.9% FLUSH
10.0000 mL | Freq: Once | INTRAVENOUS | Status: AC
  Administered 2020-05-14: 10 mL via INTRAVENOUS
  Filled 2020-05-14: qty 10

## 2020-05-14 MED FILL — METOCLOPRAMIDE 5 MG/5 ML SY: 10 | 20 days supply | Qty: 800 | Fill #0

## 2020-05-14 MED FILL — POLYETHYLENE GLYCOL 3350 PO: 17 | 11 days supply | Qty: 238 | Fill #0

## 2020-05-14 NOTE — Patient Instructions (Signed)

## 2020-05-15 ENCOUNTER — Ambulatory Visit
Admission: RE | Admit: 2020-05-15 | Discharge: 2020-05-15 | Disposition: A | Discharge status: Home / Self Care | Payer: Medicaid Other | Source: Ambulatory Visit | Attending: Radiation Oncology | Admitting: Radiation Oncology

## 2020-05-15 DIAGNOSIS — C049 Malignant neoplasm of floor of mouth, unspecified: Secondary | ICD-10-CM | POA: Diagnosis not present

## 2020-05-15 DIAGNOSIS — C023 Malignant neoplasm of anterior two-thirds of tongue, part unspecified: Secondary | ICD-10-CM | POA: Diagnosis not present

## 2020-05-16 ENCOUNTER — Ambulatory Visit
Admission: RE | Admit: 2020-05-16 | Discharge: 2020-05-16 | Disposition: A | Discharge status: Home / Self Care | Payer: Medicaid Other | Source: Ambulatory Visit | Attending: Radiation Oncology | Admitting: Radiation Oncology

## 2020-05-16 DIAGNOSIS — C049 Malignant neoplasm of floor of mouth, unspecified: Secondary | ICD-10-CM | POA: Diagnosis not present

## 2020-05-16 DIAGNOSIS — C023 Malignant neoplasm of anterior two-thirds of tongue, part unspecified: Secondary | ICD-10-CM | POA: Diagnosis not present

## 2020-05-16 NOTE — Progress Notes (Signed)
Patient Care Team: Pcp, No as PCP - General [place], Stephani Police, RN as Oncology Nurse Navigator Eppie Gibson, MD as Attending Physician (Radiation Oncology) Nicholas Lose, MD as Consulting Physician (Hematology and Oncology) Sharen Counter, CCC-SLP as Speech Language Pathologist (Speech Pathology) Wynelle Beckmann, Melodie Bouillon, PT as Physical Therapist (Physical Therapy) Karie Mainland, RD as Dietitian (Nutrition) Karie Mainland, RD as Dietitian (Nutrition) Beverely Pace, LCSW as Social Worker (General Practice)  DIAGNOSIS:    ICD-10-CM   1. 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 CT maxillofacial on 12/22/19 showed postsurgical changes and mandibular invasion, with cervical lymphadenopathy consistent with metastatic nodes. PET scan on 02/16/20 showed the tongue mass with lymph node metastases on the right floor of the mouth and cervical lymph nodes.   03/07/2020 Cancer Staging   Staging form: Oral Cavity, AJCC 8th Edition - Clinical: Stage IVA (cT4a, cN2b, cM0) - Signed by Nicholas Lose, MD on 03/07/2020   04/06/2020 -  Chemotherapy   The patient had dexamethasone (DECADRON) 4 MG tablet, 8 mg, Oral, Daily, 1 of 1 cycle, Start date: 04/26/2020, End date: -- palonosetron (ALOXI) injection 0.25 mg, 0.25 mg, Intravenous,  Once, 2 of 3 cycles Administration: 0.25 mg (04/06/2020), 0.25 mg (04/26/2020) CISplatin (PLATINOL) 165 mg in sodium chloride 0.9 % 500 mL chemo infusion, 100 mg/m2 = 165 mg, Intravenous,  Once, 2 of 3 cycles Dose modification: 75 mg/m2 (original dose 100 mg/m2, Cycle 2, Reason: Provider Judgment) Administration: 165 mg  (04/06/2020), 124 mg (04/26/2020) fosaprepitant (EMEND) 150 mg in sodium chloride 0.9 % 145 mL IVPB, 150 mg, Intravenous,  Once, 2 of 3 cycles Administration: 150 mg (04/06/2020), 150 mg (04/26/2020)  for chemotherapy treatment.      CHIEF COMPLIANT: Cycle 3 Cisplatin  INTERVAL HISTORY: Casey [name] is a 54 y.o. with above-mentioned history of floor of the mouthcancer. He is currently on concurrent chemoradiation treatment withevery 3 weekcisplatin. He presents to the clinic todayfor a toxicity check and cycle 3.  He reports that his mouth pain has improved with adequate morphine.  He has not been eating much food and also slacking off on the tube feeds.  ALLERGIES:  has No Known Allergies.  MEDICATIONS:  Current Outpatient Medications  Medication Sig Dispense Refill  . dexamethasone (DECADRON) 4 MG tablet Take 1 tablet (4 mg total) by mouth daily. Take daily for 3 days after chemo. Take with food. 30 tablet 1  . fentaNYL (DURAGESIC) 25 MCG/HR Place 1 patch onto the skin every 3 (three) days. 5 patch 0  . fluconazole (DIFLUCAN) 100 MG tablet Take 2 tablets today, then 1 tablet daily x 20 more days. 22 tablet 0  . ibuprofen (ADVIL) 200 MG tablet Take 600 mg by mouth every 6 (six) hours as needed for moderate pain.    Marland Kitchen lidocaine (XYLOCAINE) 2 % solution Patient: Mix 1part 2% viscous lidocaine, 1part H20. Swish/spit 68mL of diluted mixture to numb mouth, q 2hrs PRN. 200 mL 4  . lidocaine-prilocaine (EMLA) cream Apply to affected area once (Patient not taking: Reported on 03/20/2020) 30 g 3  . LORazepam (ATIVAN) 0.5 MG tablet Take 1 tablet (0.5  mg total) by mouth at bedtime as needed (Nausea or vomiting). (Patient not taking: Reported on 03/20/2020) 30 tablet 1  . metoCLOPramide (REGLAN) 5 MG/5ML solution Take 10 mLs (10 mg total) by mouth 4 (four) times daily -  before meals and at bedtime. May flush into PEG tube if you prefer. 800 mL 4  . morphine (ROXANOL) 20 MG/ML concentrated  solution Take 1 mL (20 mg total) by mouth every 4 (four) hours as needed for severe pain. 240 mL 0  . Multiple Vitamin (MULTIVITAMIN) tablet Take 1 tablet by mouth daily.    . Nutritional Supplements (FEEDING SUPPLEMENT, OSMOLITE 1.5 CAL,) LIQD Give 1.5 cartons of Osmolite 1.5 or equivalent via PEG 4 times daily with 60 mL free water before and after bolus feeding. Flush PEG with an additional 240 mL free water 3 times daily. Please send supplies and formula ASAP. 1422 mL 0  . ondansetron (ZOFRAN) 8 MG tablet Take 1 tablet (8 mg total) by mouth 2 (two) times daily as needed. Start on the third day after chemotherapy. (Patient not taking: Reported on 03/20/2020) 30 tablet 1  . polyethylene glycol (MIRALAX) 17 g packet Try 3 times daily until you have a bowel movement. Reduce to once a day once you have a movement to prevent constipation. 72 each 1  . prochlorperazine (COMPAZINE) 10 MG tablet Take 1 tablet (10 mg total) by mouth every 6 (six) hours as needed (Nausea or vomiting). (Patient not taking: Reported on 03/20/2020) 30 tablet 1  . scopolamine (TRANSDERM-SCOP, 1.5 MG,) 1 MG/3DAYS Place 1 patch (1.5 mg total) onto the skin every 3 (three) days. 10 patch 3   No current facility-administered medications for this visit.    PHYSICAL EXAMINATION: ECOG PERFORMANCE STATUS: 1 - Symptomatic but completely ambulatory  Vitals:   05/17/20 0854  BP: (!) 137/97  Pulse: 93  Resp: 18  Temp: 97.6 F (36.4 C)  SpO2: 100%   Filed Weights   05/17/20 0854  Weight: 121 lb 14.4 oz (55.3 kg)    LABORATORY DATA:  I have reviewed the data as listed CMP Latest Ref Rng & Units 05/17/2020 05/10/2020 05/03/2020  Glucose 70 - 99 mg/dL 127(H) 90 75  BUN 6 - 20 mg/dL 17 15 18   Creatinine 0.61 - 1.24 mg/dL 0.81 0.85 0.90  Sodium 135 - 145 mmol/L 135 135 132(L)  Potassium 3.5 - 5.1 mmol/L 4.1 4.5 4.0  Chloride 98 - 111 mmol/L 99 100 97(L)  CO2 22 - 32 mmol/L 28 31 27   Calcium 8.9 - 10.3 mg/dL 9.7 9.9 9.3  Total  Protein 6.5 - 8.1 g/dL - - -  Total Bilirubin 0.3 - 1.2 mg/dL - - -  Alkaline Phos 38 - 126 U/L - - -  AST 15 - 41 U/L - - -  ALT 0 - 44 U/L - - -    Lab Results  Component Value Date   WBC 3.4 (L) 05/17/2020   HGB 10.1 (L) 05/17/2020   HCT 29.1 (L) 05/17/2020   MCV 92.7 05/17/2020   PLT 201 05/17/2020   NEUTROABS 2.4 05/17/2020    ASSESSMENT & PLAN:  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  CT maxillofacial on 12/22/19 showed postsurgical changes and mandibular invasion, with cervical lymphadenopathy consistent with metastatic nodes. MRI neck: 12/23/2019: Floor  of mouth mass 3.4 cm with involvement of genioglossus muscle and erosion of inner cortex of the right mandible, multiple necrotic level 1B lymph nodes largest 1.6 cm T4AN2B M0 stage IVa clinical stage  PET scan on 02/16/20 showed the tongue mass with lymph node metastases on the right floor of the mouth and cervical lymph nodes. CK7 and CD 117 are negative, p63 and CK 5/6 are positive, p16 is negative  Treatment plan: 1.Concurrent chemoradiation with cisplatinevery 3 weeks x3 2.PEG tube and port placement 3.Patient will require IV fluids on a weekly basis --------------------------------------------------------------------------------------------------------------------------------------- Current treatment: Cycle 3 cisplatin(started9/10/2019) with XRT Patient will come weekly for IV fluid hydration  Toxicities: Denies any nausea or vomiting. Loss of taste and appetite Decreased oral intake of fluids: Patient is meeting with dietitian and also receiving IV fluids He needs to increase his tube feeds.  He is working with dietitian.  Today's last cycle of cisplatin.  He will continue  to come weekly for IV fluids.     No orders of the defined types were placed in this encounter.  The patient has a good understanding of the overall plan. he agrees with it. he will call with any problems that may develop before the next visit here.  Total time spent: 30 mins including face to face time and time spent for planning, charting and coordination of care  Nicholas Lose, MD 05/17/2020  I, Cloyde Reams Dorshimer, am acting as scribe for Dr. Nicholas Lose.  I have reviewed the above documentation for accuracy and completeness, and I agree with the above.

## 2020-05-17 ENCOUNTER — Inpatient Hospital Stay: Payer: Medicaid Other

## 2020-05-17 ENCOUNTER — Other Ambulatory Visit: Payer: Self-pay

## 2020-05-17 ENCOUNTER — Inpatient Hospital Stay: Payer: Medicaid Other | Admitting: General Practice

## 2020-05-17 ENCOUNTER — Ambulatory Visit
Admission: RE | Admit: 2020-05-17 | Discharge: 2020-05-17 | Disposition: A | Discharge status: Home / Self Care | Payer: Medicaid Other | Source: Ambulatory Visit | Attending: Radiation Oncology | Admitting: Radiation Oncology

## 2020-05-17 ENCOUNTER — Inpatient Hospital Stay (HOSPITAL_BASED_OUTPATIENT_CLINIC_OR_DEPARTMENT_OTHER): Payer: Medicaid Other | Admitting: Hematology and Oncology

## 2020-05-17 VITALS — BP 130/94

## 2020-05-17 DIAGNOSIS — C770 Secondary and unspecified malignant neoplasm of lymph nodes of head, face and neck: Secondary | ICD-10-CM | POA: Diagnosis not present

## 2020-05-17 DIAGNOSIS — R630 Anorexia: Secondary | ICD-10-CM | POA: Diagnosis not present

## 2020-05-17 DIAGNOSIS — C049 Malignant neoplasm of floor of mouth, unspecified: Secondary | ICD-10-CM | POA: Diagnosis not present

## 2020-05-17 DIAGNOSIS — Z5111 Encounter for antineoplastic chemotherapy: Secondary | ICD-10-CM | POA: Diagnosis not present

## 2020-05-17 DIAGNOSIS — C023 Malignant neoplasm of anterior two-thirds of tongue, part unspecified: Secondary | ICD-10-CM

## 2020-05-17 LAB — BASIC METABOLIC PANEL - CANCER CENTER ONLY
Anion gap: 8 (ref 5–15)
BUN: 17 mg/dL (ref 6–20)
CO2: 28 mmol/L (ref 22–32)
Calcium: 9.7 mg/dL (ref 8.9–10.3)
Chloride: 99 mmol/L (ref 98–111)
Creatinine: 0.81 mg/dL (ref 0.61–1.24)
GFR, Estimated: >60 mL/min (ref >60)
Glucose, Bld: 127 mg/dL — ABNORMAL HIGH (ref 70–99)
Potassium: 4.1 mmol/L (ref 3.5–5.1)
Sodium: 135 mmol/L (ref 135–145)

## 2020-05-17 LAB — CBC WITH DIFFERENTIAL (CANCER CENTER ONLY)
Abs Immature Granulocytes: 0.01 10*3/uL (ref 0.00–0.07)
Basophils Absolute: 0 10*3/uL (ref 0.0–0.1)
Basophils Relative: 1 %
Eosinophils Absolute: 0.1 10*3/uL (ref 0.0–0.5)
Eosinophils Relative: 2 %
HCT: 29.1 % — ABNORMAL LOW (ref 39.0–52.0)
Hemoglobin: 10.1 g/dL — ABNORMAL LOW (ref 13.0–17.0)
Immature Granulocytes: 0 %
Lymphocytes Relative: 11 %
Lymphs Abs: 0.4 10*3/uL — ABNORMAL LOW (ref 0.7–4.0)
MCH: 32.2 pg (ref 26.0–34.0)
MCHC: 34.7 g/dL (ref 30.0–36.0)
MCV: 92.7 fL (ref 80.0–100.0)
Monocytes Absolute: 0.5 10*3/uL (ref 0.1–1.0)
Monocytes Relative: 15 %
Neutro Abs: 2.4 10*3/uL (ref 1.7–7.7)
Neutrophils Relative %: 71 %
Platelet Count: 201 10*3/uL (ref 150–400)
RBC: 3.14 MIL/uL — ABNORMAL LOW (ref 4.22–5.81)
RDW: 13.3 % (ref 11.5–15.5)
WBC Count: 3.4 10*3/uL — ABNORMAL LOW (ref 4.0–10.5)
nRBC: 0 % (ref 0.0–0.2)

## 2020-05-17 LAB — MAGNESIUM: Magnesium: 1.5 mg/dL — ABNORMAL LOW (ref 1.7–2.4)

## 2020-05-17 MED ORDER — PALONOSETRON HCL INJECTION 0.25 MG/5ML
INTRAVENOUS | Status: AC
  Filled 2020-05-17: qty 5

## 2020-05-17 MED ORDER — HEPARIN SOD (PORK) LOCK FLUSH 100 UNIT/ML IV SOLN
500.0000 [IU] | Freq: Once | INTRAVENOUS | Status: AC | PRN
  Administered 2020-05-17: 500 [IU]
  Filled 2020-05-17: qty 5

## 2020-05-17 MED ORDER — SODIUM CHLORIDE 0.9 % IV SOLN
75.0000 mg/m2 | Freq: Once | INTRAVENOUS | Status: AC
  Administered 2020-05-17: 124 mg via INTRAVENOUS
  Filled 2020-05-17: qty 124

## 2020-05-17 MED ORDER — MAGNESIUM SULFATE 2 GM/50ML IV SOLN
2.0000 g | Freq: Once | INTRAVENOUS | Status: AC
  Administered 2020-05-17: 2 g via INTRAVENOUS

## 2020-05-17 MED ORDER — SODIUM CHLORIDE 0.9 % IV SOLN
Freq: Once | INTRAVENOUS | Status: AC
  Filled 2020-05-17: qty 10

## 2020-05-17 MED ORDER — PALONOSETRON HCL INJECTION 0.25 MG/5ML
0.2500 mg | Freq: Once | INTRAVENOUS | Status: AC
  Administered 2020-05-17: 0.25 mg via INTRAVENOUS

## 2020-05-17 MED ORDER — SODIUM CHLORIDE 0.9% FLUSH
10.0000 mL | INTRAVENOUS | Status: DC | PRN
  Administered 2020-05-17: 10 mL
  Filled 2020-05-17: qty 10

## 2020-05-17 MED ORDER — MAGNESIUM SULFATE 2 GM/50ML IV SOLN
INTRAVENOUS | Status: AC
  Filled 2020-05-17: qty 50

## 2020-05-17 MED ORDER — SODIUM CHLORIDE 0.9 % IV SOLN
10.0000 mg | Freq: Once | INTRAVENOUS | Status: AC
  Administered 2020-05-17: 10 mg via INTRAVENOUS
  Filled 2020-05-17: qty 10

## 2020-05-17 MED ORDER — SODIUM CHLORIDE 0.9 % IV SOLN
150.0000 mg | Freq: Once | INTRAVENOUS | Status: AC
  Administered 2020-05-17: 150 mg via INTRAVENOUS
  Filled 2020-05-17: qty 150

## 2020-05-17 NOTE — Progress Notes (Signed)
Per Dr. Lindi Adie, [place] to treat with urine output of 200 ml instead of 250 ml. Lillia Corporal, RN

## 2020-05-17 NOTE — Patient Instructions (Signed)
[facility] Discharge Instructions for Patients Receiving Chemotherapy  Today you received the following chemotherapy agents Cisplatin  To help prevent nausea and vomiting after your treatment, we encourage you to take your nausea medication as directed  If you develop nausea and vomiting that is not controlled by your nausea medication, call the clinic.   BELOW ARE SYMPTOMS THAT SHOULD BE REPORTED IMMEDIATELY:  *FEVER GREATER THAN 100.5 F  *CHILLS WITH OR WITHOUT FEVER  NAUSEA AND VOMITING THAT IS NOT CONTROLLED WITH YOUR NAUSEA MEDICATION  *UNUSUAL SHORTNESS OF BREATH  *UNUSUAL BRUISING OR BLEEDING  TENDERNESS IN MOUTH AND THROAT WITH OR WITHOUT PRESENCE OF ULCERS  *URINARY PROBLEMS  *BOWEL PROBLEMS  UNUSUAL RASH Items with * indicate a potential emergency and should be followed up as soon as possible.  Feel free to call the clinic should you have any questions or concerns. The clinic phone number is [phone].  Please show the CHEMO ALERT CARD at check-in to the Emergency Department and triage nurse.   

## 2020-05-17 NOTE — Progress Notes (Signed)
[place] CSW Progress Notes  Check in visit with patient during infusion.  He is doing well overall and looking forward to the conclusion of treatment.  No current concerns or needs, he is encouraged to reach out as needed.  Edwyna Shell, LCSW Clinical Social Worker Phone:  [phone]

## 2020-05-17 NOTE — Assessment & Plan Note (Signed)
12/21/2019:In March 2021 he underwent dental etxractions and his dentists referred him for evaluation. 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  CT maxillofacial on 12/22/19 showed postsurgical changes and mandibular invasion, with cervical lymphadenopathy consistent with metastatic nodes. MRI neck: 12/23/2019: Floor of mouth mass 3.4 cm with involvement of genioglossus muscle and erosion of inner cortex of the right mandible, multiple necrotic level 1B lymph nodes largest 1.6 cm T4AN2B M0 stage IVa clinical stage  PET scan on 02/16/20 showed the tongue mass with lymph node metastases on the right floor of the mouth and cervical lymph nodes. CK7 and CD 117 are negative, p63 and CK 5/6 are positive, p16 is negative  Treatment plan: 1.Concurrent chemoradiation with cisplatinevery 3 weeks x3 2.PEG tube and port placement 3.Patient will require IV fluids on a weekly basis --------------------------------------------------------------------------------------------------------------------------------------- Current treatment: Cycle 3 cisplatin(started9/10/2019) with XRT Patient will come weekly for IV fluid hydration  Toxicities: Denies any nausea or vomiting. Loss of taste and appetite Decreased oral intake of fluids: Patient is meeting with dietitian and also receiving IV fluids He needs to start taking tube feeds.  Return to clinic3 times a week for IV fluids and in 3 weeks for cycle 3 of cisplatin

## 2020-05-18 ENCOUNTER — Other Ambulatory Visit: Payer: Self-pay

## 2020-05-18 ENCOUNTER — Ambulatory Visit
Admission: RE | Admit: 2020-05-18 | Discharge: 2020-05-18 | Disposition: A | Discharge status: Home / Self Care | Payer: Medicaid Other | Source: Ambulatory Visit | Attending: Radiation Oncology | Admitting: Radiation Oncology

## 2020-05-18 ENCOUNTER — Inpatient Hospital Stay: Payer: Medicaid Other

## 2020-05-18 VITALS — BP 141/85 | HR 70 | Temp 98.8°F | Resp 18

## 2020-05-18 DIAGNOSIS — Z5111 Encounter for antineoplastic chemotherapy: Secondary | ICD-10-CM | POA: Diagnosis not present

## 2020-05-18 DIAGNOSIS — C770 Secondary and unspecified malignant neoplasm of lymph nodes of head, face and neck: Secondary | ICD-10-CM | POA: Diagnosis not present

## 2020-05-18 DIAGNOSIS — C023 Malignant neoplasm of anterior two-thirds of tongue, part unspecified: Secondary | ICD-10-CM | POA: Diagnosis not present

## 2020-05-18 DIAGNOSIS — C049 Malignant neoplasm of floor of mouth, unspecified: Secondary | ICD-10-CM

## 2020-05-18 DIAGNOSIS — Z95828 Presence of other vascular implants and grafts: Secondary | ICD-10-CM

## 2020-05-18 DIAGNOSIS — R630 Anorexia: Secondary | ICD-10-CM | POA: Diagnosis not present

## 2020-05-18 MED ORDER — HEPARIN SOD (PORK) LOCK FLUSH 100 UNIT/ML IV SOLN
500.0000 [IU] | Freq: Once | INTRAVENOUS | Status: AC
  Administered 2020-05-18: 500 [IU] via INTRAVENOUS
  Filled 2020-05-18: qty 5

## 2020-05-18 MED ORDER — SODIUM CHLORIDE 0.9 % IV SOLN
Freq: Once | INTRAVENOUS | Status: AC
  Filled 2020-05-18: qty 1000

## 2020-05-18 MED ORDER — SODIUM CHLORIDE 0.9% FLUSH
10.0000 mL | Freq: Once | INTRAVENOUS | Status: AC
  Administered 2020-05-18: 10 mL via INTRAVENOUS
  Filled 2020-05-18: qty 10

## 2020-05-18 NOTE — Patient Instructions (Signed)

## 2020-05-21 ENCOUNTER — Other Ambulatory Visit: Payer: Self-pay

## 2020-05-21 ENCOUNTER — Telehealth: Payer: Self-pay | Admitting: Hematology and Oncology

## 2020-05-21 ENCOUNTER — Other Ambulatory Visit: Payer: Self-pay | Admitting: Radiation Oncology

## 2020-05-21 ENCOUNTER — Ambulatory Visit
Admission: RE | Admit: 2020-05-21 | Discharge: 2020-05-21 | Disposition: A | Discharge status: Home / Self Care | Payer: Medicaid Other | Source: Ambulatory Visit | Attending: Radiation Oncology | Admitting: Radiation Oncology

## 2020-05-21 ENCOUNTER — Inpatient Hospital Stay: Payer: Medicaid Other

## 2020-05-21 VITALS — BP 143/85 | HR 65 | Temp 97.8°F | Resp 16

## 2020-05-21 DIAGNOSIS — C049 Malignant neoplasm of floor of mouth, unspecified: Secondary | ICD-10-CM

## 2020-05-21 DIAGNOSIS — Z5111 Encounter for antineoplastic chemotherapy: Secondary | ICD-10-CM | POA: Diagnosis not present

## 2020-05-21 DIAGNOSIS — C770 Secondary and unspecified malignant neoplasm of lymph nodes of head, face and neck: Secondary | ICD-10-CM | POA: Diagnosis not present

## 2020-05-21 DIAGNOSIS — C023 Malignant neoplasm of anterior two-thirds of tongue, part unspecified: Secondary | ICD-10-CM | POA: Diagnosis not present

## 2020-05-21 DIAGNOSIS — Z95828 Presence of other vascular implants and grafts: Secondary | ICD-10-CM

## 2020-05-21 DIAGNOSIS — R630 Anorexia: Secondary | ICD-10-CM | POA: Diagnosis not present

## 2020-05-21 MED ORDER — HEPARIN SOD (PORK) LOCK FLUSH 100 UNIT/ML IV SOLN
500.0000 [IU] | Freq: Once | INTRAVENOUS | Status: AC
  Administered 2020-05-21: 500 [IU] via INTRAVENOUS
  Filled 2020-05-21: qty 5

## 2020-05-21 MED ORDER — SODIUM CHLORIDE 0.9 % IV SOLN
Freq: Once | INTRAVENOUS | Status: AC
  Filled 2020-05-21: qty 1000

## 2020-05-21 MED ORDER — SODIUM CHLORIDE 0.9 % IV SOLN
INTRAVENOUS | Status: DC
  Filled 2020-05-21: qty 250

## 2020-05-21 MED ORDER — SODIUM CHLORIDE 0.9% FLUSH
10.0000 mL | Freq: Once | INTRAVENOUS | Status: AC
  Administered 2020-05-21: 10 mL via INTRAVENOUS
  Filled 2020-05-21: qty 10

## 2020-05-21 MED FILL — LIDOCAINE 2% VISCOUS SOLN: 2 | 3 days supply | Qty: 200 | Fill #1

## 2020-05-21 NOTE — Patient Instructions (Signed)

## 2020-05-21 NOTE — Telephone Encounter (Signed)
No 10/14 los, no changes made to pt schedule

## 2020-05-22 ENCOUNTER — Ambulatory Visit
Admission: RE | Admit: 2020-05-22 | Discharge: 2020-05-22 | Disposition: A | Discharge status: Home / Self Care | Payer: Medicaid Other | Source: Ambulatory Visit | Attending: Radiation Oncology | Admitting: Radiation Oncology

## 2020-05-22 DIAGNOSIS — C023 Malignant neoplasm of anterior two-thirds of tongue, part unspecified: Secondary | ICD-10-CM | POA: Diagnosis not present

## 2020-05-22 DIAGNOSIS — C049 Malignant neoplasm of floor of mouth, unspecified: Secondary | ICD-10-CM | POA: Diagnosis not present

## 2020-05-23 ENCOUNTER — Encounter: Payer: Self-pay | Admitting: Radiation Oncology

## 2020-05-23 ENCOUNTER — Ambulatory Visit
Admission: RE | Admit: 2020-05-23 | Discharge: 2020-05-23 | Disposition: A | Discharge status: Home / Self Care | Payer: Medicaid Other | Source: Ambulatory Visit | Attending: Radiation Oncology | Admitting: Radiation Oncology

## 2020-05-23 ENCOUNTER — Other Ambulatory Visit: Payer: Self-pay | Admitting: Hematology and Oncology

## 2020-05-23 DIAGNOSIS — C023 Malignant neoplasm of anterior two-thirds of tongue, part unspecified: Secondary | ICD-10-CM | POA: Diagnosis not present

## 2020-05-23 DIAGNOSIS — C049 Malignant neoplasm of floor of mouth, unspecified: Secondary | ICD-10-CM | POA: Diagnosis not present

## 2020-05-23 MED ORDER — OXYCODONE-ACETAMINOPHEN 5-325 MG PO TABS
1.0000 | ORAL_TABLET | Freq: Four times a day (QID) | ORAL | 0 refills | Status: DC | PRN
Start: 2020-05-23 — End: 2020-07-20

## 2020-05-23 MED FILL — OXYCODONE-APAP 5-325MG: 5-325 | 22 days supply | Qty: 90 | Fill #0

## 2020-05-23 NOTE — Progress Notes (Signed)
Oncology Nurse Navigator Documentation  Met with Mr. Twist after final RT to offer support and to celebrate end of radiation treatment.   Provided verbal/written post-RT guidance:  Importance of keeping all follow-up appts, especially those with Nutrition and SLP.  Importance of protecting treatment area from sun.  Continuation of Sonafine application 2-3 times daily, application of antibiotic ointment to areas of raw skin; when supply of Sonafine exhausted transition to OTC lotion with vitamin E. Provided/reviewed Epic calendar of upcoming appts. Explained my role as navigator will continue for several more months, encouraged him to call me with needs/concerns.    [name] RN, BSN, OCN Head & Neck Oncology Nurse [place] [facility] Phone # [phone]  Fax # [phone]

## 2020-05-24 ENCOUNTER — Inpatient Hospital Stay: Payer: Medicaid Other

## 2020-05-24 ENCOUNTER — Other Ambulatory Visit: Payer: Self-pay

## 2020-05-24 ENCOUNTER — Inpatient Hospital Stay: Payer: Medicaid Other | Admitting: Nutrition

## 2020-05-24 VITALS — BP 122/75 | HR 77 | Temp 97.9°F | Resp 18

## 2020-05-24 DIAGNOSIS — Z5111 Encounter for antineoplastic chemotherapy: Secondary | ICD-10-CM | POA: Diagnosis not present

## 2020-05-24 DIAGNOSIS — C049 Malignant neoplasm of floor of mouth, unspecified: Secondary | ICD-10-CM

## 2020-05-24 DIAGNOSIS — Z95828 Presence of other vascular implants and grafts: Secondary | ICD-10-CM

## 2020-05-24 DIAGNOSIS — R630 Anorexia: Secondary | ICD-10-CM | POA: Diagnosis not present

## 2020-05-24 DIAGNOSIS — C770 Secondary and unspecified malignant neoplasm of lymph nodes of head, face and neck: Secondary | ICD-10-CM | POA: Diagnosis not present

## 2020-05-24 LAB — BASIC METABOLIC PANEL - CANCER CENTER ONLY
Anion gap: 9 (ref 5–15)
BUN: 21 mg/dL — ABNORMAL HIGH (ref 6–20)
CO2: 28 mmol/L (ref 22–32)
Calcium: 9.2 mg/dL (ref 8.9–10.3)
Chloride: 98 mmol/L (ref 98–111)
Creatinine: 1.09 mg/dL (ref 0.61–1.24)
GFR, Estimated: >60 mL/min (ref >60)
Glucose, Bld: 159 mg/dL — ABNORMAL HIGH (ref 70–99)
Potassium: 4.4 mmol/L (ref 3.5–5.1)
Sodium: 135 mmol/L (ref 135–145)

## 2020-05-24 LAB — CBC WITH DIFFERENTIAL (CANCER CENTER ONLY)
Abs Immature Granulocytes: 0.02 10*3/uL (ref 0.00–0.07)
Basophils Absolute: 0 10*3/uL (ref 0.0–0.1)
Basophils Relative: 0 %
Eosinophils Absolute: 0 10*3/uL (ref 0.0–0.5)
Eosinophils Relative: 0 %
HCT: 31.1 % — ABNORMAL LOW (ref 39.0–52.0)
Hemoglobin: 11 g/dL — ABNORMAL LOW (ref 13.0–17.0)
Immature Granulocytes: 1 %
Lymphocytes Relative: 9 %
Lymphs Abs: 0.3 10*3/uL — ABNORMAL LOW (ref 0.7–4.0)
MCH: 32.7 pg (ref 26.0–34.0)
MCHC: 35.4 g/dL (ref 30.0–36.0)
MCV: 92.6 fL (ref 80.0–100.0)
Monocytes Absolute: 0.5 10*3/uL (ref 0.1–1.0)
Monocytes Relative: 16 %
Neutro Abs: 2.4 10*3/uL (ref 1.7–7.7)
Neutrophils Relative %: 74 %
Platelet Count: 266 10*3/uL (ref 150–400)
RBC: 3.36 MIL/uL — ABNORMAL LOW (ref 4.22–5.81)
RDW: 13.4 % (ref 11.5–15.5)
WBC Count: 3.2 10*3/uL — ABNORMAL LOW (ref 4.0–10.5)
nRBC: 0 % (ref 0.0–0.2)

## 2020-05-24 MED ORDER — SODIUM CHLORIDE 0.9% FLUSH
10.0000 mL | INTRAVENOUS | Status: AC | PRN
  Administered 2020-05-24: 10 mL
  Filled 2020-05-24: qty 10

## 2020-05-24 MED ORDER — SODIUM CHLORIDE 0.9 % IV SOLN
INTRAVENOUS | Status: DC
  Filled 2020-05-24 (×2): qty 250

## 2020-05-24 MED ORDER — SODIUM CHLORIDE 0.9% FLUSH
10.0000 mL | INTRAVENOUS | Status: DC | PRN
  Administered 2020-05-24: 10 mL via INTRAVENOUS
  Filled 2020-05-24: qty 10

## 2020-05-24 MED ORDER — HEPARIN SOD (PORK) LOCK FLUSH 100 UNIT/ML IV SOLN
500.0000 [IU] | INTRAVENOUS | Status: AC | PRN
  Administered 2020-05-24: 500 [IU]
  Filled 2020-05-24: qty 5

## 2020-05-24 NOTE — Progress Notes (Signed)
Nutrition follow-up completed with patient during IV fluids. Patient has completed chemoradiation therapy for floor of mouth and anterior tongue cancer. Weight was documented as 123.8 pounds on October 18 decreased from 135 pounds October 23. Patient reports his tongue is swollen and he has increased pain. He is trying to sip on water but that is all he is able to take by mouth at this time. Reports using 4 cartons of Osmolite 1.5 via PEG. Reports he had one episode of vomiting this morning.  He was unable to take Ativan prior to giving feeding. Noted labs: Glucose 159 and BUN 21.  Estimated nutrition needs: 1800-2100 cal, 90-105 g protein, 1.8 L fluid.  Nutrition diagnosis: Inadequate oral intake continues.  Intervention: Educated patient to increase Osmolite 1.5-6 cartons daily to provide 2130 cal, 89.4 g protein, 2286 mL free water. Encourage patient to continue to drink water by mouth and other liquids as tolerated. Recommended he continue oral exercises. Continue bowel regimen.  Monitoring, evaluation, goals: Patient will tolerate increased tube feeding to minimize further weight loss and promote healing.  Next visit: Friday, November 5 by telephone.  **Disclaimer: This note was dictated with voice recognition software. Similar sounding words can inadvertently be transcribed and this note may contain transcription errors which may not have been corrected upon publication of note.**

## 2020-05-24 NOTE — Patient Instructions (Signed)

## 2020-05-25 ENCOUNTER — Inpatient Hospital Stay: Payer: Medicaid Other

## 2020-05-25 ENCOUNTER — Other Ambulatory Visit: Payer: Self-pay

## 2020-05-25 VITALS — BP 138/82 | HR 72 | Temp 98.2°F | Resp 18

## 2020-05-25 DIAGNOSIS — C049 Malignant neoplasm of floor of mouth, unspecified: Secondary | ICD-10-CM | POA: Diagnosis not present

## 2020-05-25 DIAGNOSIS — Z5111 Encounter for antineoplastic chemotherapy: Secondary | ICD-10-CM | POA: Diagnosis not present

## 2020-05-25 DIAGNOSIS — Z95828 Presence of other vascular implants and grafts: Secondary | ICD-10-CM

## 2020-05-25 DIAGNOSIS — R630 Anorexia: Secondary | ICD-10-CM | POA: Diagnosis not present

## 2020-05-25 DIAGNOSIS — C770 Secondary and unspecified malignant neoplasm of lymph nodes of head, face and neck: Secondary | ICD-10-CM | POA: Diagnosis not present

## 2020-05-25 MED ORDER — HEPARIN SOD (PORK) LOCK FLUSH 100 UNIT/ML IV SOLN
500.0000 [IU] | Freq: Once | INTRAVENOUS | Status: AC
  Administered 2020-05-25: 500 [IU] via INTRAVENOUS
  Filled 2020-05-25: qty 5

## 2020-05-25 MED ORDER — SODIUM CHLORIDE 0.9% FLUSH
10.0000 mL | Freq: Once | INTRAVENOUS | Status: AC
  Administered 2020-05-25: 10 mL via INTRAVENOUS
  Filled 2020-05-25: qty 10

## 2020-05-25 MED ORDER — SODIUM CHLORIDE 0.9 % IV SOLN
Freq: Once | INTRAVENOUS | Status: AC
  Filled 2020-05-25: qty 250

## 2020-05-25 NOTE — Addendum Note (Signed)
Addended by: Teodoro Spray on: 05/25/2020 12:30 PM   Modules accepted: Orders

## 2020-05-25 NOTE — Patient Instructions (Signed)

## 2020-05-28 ENCOUNTER — Other Ambulatory Visit: Payer: Self-pay | Admitting: Hematology and Oncology

## 2020-05-28 ENCOUNTER — Other Ambulatory Visit: Payer: Self-pay

## 2020-05-28 ENCOUNTER — Inpatient Hospital Stay: Payer: Medicaid Other

## 2020-05-28 VITALS — BP 121/79 | HR 79 | Temp 98.8°F | Resp 18 | Wt 121.5 lb

## 2020-05-28 DIAGNOSIS — C049 Malignant neoplasm of floor of mouth, unspecified: Secondary | ICD-10-CM | POA: Diagnosis not present

## 2020-05-28 DIAGNOSIS — C023 Malignant neoplasm of anterior two-thirds of tongue, part unspecified: Secondary | ICD-10-CM

## 2020-05-28 DIAGNOSIS — R630 Anorexia: Secondary | ICD-10-CM | POA: Diagnosis not present

## 2020-05-28 DIAGNOSIS — Z5111 Encounter for antineoplastic chemotherapy: Secondary | ICD-10-CM | POA: Diagnosis not present

## 2020-05-28 DIAGNOSIS — C770 Secondary and unspecified malignant neoplasm of lymph nodes of head, face and neck: Secondary | ICD-10-CM | POA: Diagnosis not present

## 2020-05-28 MED ORDER — HEPARIN SOD (PORK) LOCK FLUSH 100 UNIT/ML IV SOLN
500.0000 [IU] | Freq: Once | INTRAVENOUS | Status: AC
  Administered 2020-05-28: 500 [IU] via INTRAVENOUS
  Filled 2020-05-28: qty 5

## 2020-05-28 MED ORDER — FENTANYL 25 MCG/HR TD PT72: 1.0000 | MEDICATED_PATCH | TRANSDERMAL | 0 refills | Status: DC

## 2020-05-28 MED ORDER — SODIUM CHLORIDE 0.9% FLUSH
10.0000 mL | Freq: Once | INTRAVENOUS | Status: AC
  Administered 2020-05-28: 10 mL via INTRAVENOUS
  Filled 2020-05-28: qty 10

## 2020-05-28 MED ORDER — SODIUM CHLORIDE 0.9 % IV SOLN
Freq: Once | INTRAVENOUS | Status: AC
  Filled 2020-05-28: qty 250

## 2020-05-28 MED FILL — SCOPOLAMINE 1 MG/3 DAY PATC: 1 | 30 days supply | Qty: 10 | Fill #1

## 2020-05-28 NOTE — Patient Instructions (Signed)

## 2020-05-28 NOTE — Progress Notes (Signed)
Per Dr. Lindi Adie, patient should receive 1L normal saline over 2 hours today.

## 2020-05-29 ENCOUNTER — Telehealth: Payer: Self-pay | Admitting: Hematology and Oncology

## 2020-05-29 NOTE — Telephone Encounter (Signed)
I left a voicemail for the patient's sister Lelon Frohlich who wanted to talk to me about his depression. I will speak to her if she returns the call. I will also discussed this with the patient when he sees me next time.

## 2020-05-31 ENCOUNTER — Inpatient Hospital Stay: Payer: Medicaid Other

## 2020-05-31 ENCOUNTER — Ambulatory Visit: Payer: Self-pay | Admitting: Nutrition

## 2020-05-31 ENCOUNTER — Inpatient Hospital Stay (HOSPITAL_BASED_OUTPATIENT_CLINIC_OR_DEPARTMENT_OTHER): Payer: Medicaid Other | Admitting: Medical

## 2020-05-31 ENCOUNTER — Other Ambulatory Visit: Payer: Self-pay

## 2020-05-31 ENCOUNTER — Inpatient Hospital Stay: Payer: Medicaid Other | Admitting: General Practice

## 2020-05-31 ENCOUNTER — Other Ambulatory Visit: Payer: Self-pay | Admitting: Oncology

## 2020-05-31 VITALS — BP 142/96 | HR 84 | Temp 98.1°F | Resp 18

## 2020-05-31 DIAGNOSIS — R112 Nausea with vomiting, unspecified: Secondary | ICD-10-CM

## 2020-05-31 DIAGNOSIS — C023 Malignant neoplasm of anterior two-thirds of tongue, part unspecified: Secondary | ICD-10-CM

## 2020-05-31 DIAGNOSIS — C049 Malignant neoplasm of floor of mouth, unspecified: Secondary | ICD-10-CM

## 2020-05-31 DIAGNOSIS — Z5111 Encounter for antineoplastic chemotherapy: Secondary | ICD-10-CM | POA: Diagnosis not present

## 2020-05-31 DIAGNOSIS — R630 Anorexia: Secondary | ICD-10-CM | POA: Diagnosis not present

## 2020-05-31 DIAGNOSIS — Z95828 Presence of other vascular implants and grafts: Secondary | ICD-10-CM

## 2020-05-31 DIAGNOSIS — C770 Secondary and unspecified malignant neoplasm of lymph nodes of head, face and neck: Secondary | ICD-10-CM | POA: Diagnosis not present

## 2020-05-31 LAB — BASIC METABOLIC PANEL - CANCER CENTER ONLY
Anion gap: 8 (ref 5–15)
BUN: 20 mg/dL (ref 6–20)
CO2: 27 mmol/L (ref 22–32)
Calcium: 9.6 mg/dL (ref 8.9–10.3)
Chloride: 99 mmol/L (ref 98–111)
Creatinine: 1.07 mg/dL (ref 0.61–1.24)
GFR, Estimated: >60 mL/min (ref >60)
Glucose, Bld: 197 mg/dL — ABNORMAL HIGH (ref 70–99)
Potassium: 4.4 mmol/L (ref 3.5–5.1)
Sodium: 134 mmol/L — ABNORMAL LOW (ref 135–145)

## 2020-05-31 LAB — CBC WITH DIFFERENTIAL (CANCER CENTER ONLY)
Abs Immature Granulocytes: 0.02 10*3/uL (ref 0.00–0.07)
Basophils Absolute: 0 10*3/uL (ref 0.0–0.1)
Basophils Relative: 1 %
Eosinophils Absolute: 0 10*3/uL (ref 0.0–0.5)
Eosinophils Relative: 0 %
HCT: 29.3 % — ABNORMAL LOW (ref 39.0–52.0)
Hemoglobin: 10.4 g/dL — ABNORMAL LOW (ref 13.0–17.0)
Immature Granulocytes: 1 %
Lymphocytes Relative: 13 %
Lymphs Abs: 0.5 10*3/uL — ABNORMAL LOW (ref 0.7–4.0)
MCH: 33.1 pg (ref 26.0–34.0)
MCHC: 35.5 g/dL (ref 30.0–36.0)
MCV: 93.3 fL (ref 80.0–100.0)
Monocytes Absolute: 0.5 10*3/uL (ref 0.1–1.0)
Monocytes Relative: 13 %
Neutro Abs: 2.6 10*3/uL (ref 1.7–7.7)
Neutrophils Relative %: 72 %
Platelet Count: 254 10*3/uL (ref 150–400)
RBC: 3.14 MIL/uL — ABNORMAL LOW (ref 4.22–5.81)
RDW: 13.6 % (ref 11.5–15.5)
WBC Count: 3.6 10*3/uL — ABNORMAL LOW (ref 4.0–10.5)
nRBC: 0 % (ref 0.0–0.2)

## 2020-05-31 MED ORDER — SODIUM CHLORIDE 0.9 % IV SOLN
Freq: Once | INTRAVENOUS | Status: AC
  Filled 2020-05-31: qty 1000

## 2020-05-31 MED ORDER — SODIUM CHLORIDE 0.9% FLUSH
10.0000 mL | Freq: Once | INTRAVENOUS | Status: AC
  Administered 2020-05-31: 10 mL via INTRAVENOUS
  Filled 2020-05-31: qty 10

## 2020-05-31 MED ORDER — HEPARIN SOD (PORK) LOCK FLUSH 10 UNIT/ML IV SOLN: 10.0000 [IU] | Freq: Once | INTRAVENOUS | Status: DC

## 2020-05-31 MED ORDER — FENTANYL 25 MCG/HR TD PT72
1.0000 | MEDICATED_PATCH | TRANSDERMAL | 0 refills | Status: DC
Start: 2020-05-31 — End: 2020-06-15

## 2020-05-31 MED ORDER — HEPARIN SOD (PORK) LOCK FLUSH 100 UNIT/ML IV SOLN
500.0000 [IU] | Freq: Once | INTRAVENOUS | Status: AC
  Filled 2020-05-31: qty 5

## 2020-05-31 MED ORDER — HEPARIN SOD (PORK) LOCK FLUSH 100 UNIT/ML IV SOLN
500.0000 [IU] | Freq: Once | INTRAVENOUS | Status: AC
  Administered 2020-05-31: 500 [IU] via INTRAVENOUS
  Filled 2020-05-31: qty 5

## 2020-05-31 MED ORDER — SODIUM CHLORIDE 0.9% FLUSH
10.0000 mL | INTRAVENOUS | Status: DC | PRN
  Administered 2020-05-31: 10 mL via INTRAVENOUS
  Filled 2020-05-31: qty 10

## 2020-05-31 MED FILL — FENTANYL 25 MCG/HR PT72: 25 | 15 days supply | Qty: 5 | Fill #0

## 2020-05-31 NOTE — Progress Notes (Signed)
[place] CSW Progress Notes  Met w patient in infusion at request of nurse navigator - sister/caregiver has expressed concern about patient and feels he may be depressed.  Patient admits that has reduced energy and increased frustration - he attributes these to "feeling sick all week long, I can't eat anything, my throat is so tight, it hurts to talk."  Has been told that these symptoms sometimes worsen after radiation but he does not know how long this will last.  Rates his mood as 6 out of 10 in terms of depression.  When asked what would make it a "5", states "if I dont feel better soon."  Would be a "7" if he gets relief of symptoms.  On the other hand, he does admit to spending increased time in his room and being more isolative.  Does not have any activities that he enjoys, little socialization with family.  Expresses the understandable discomfort with desiring to return to work, being unable to do so due to his physical health, and uncertainty over whether he will be granted disability.  He will not have a disability decision until Jan 2022.  He finds it difficult to make any kind of future oriented plans as he does not know "whether this will come back", yet worries about what his future might hold.  He does express a desire for a follow up visit w a medical provider next week to monitor his symptoms.  At this point, although he does endorse depressed mood, other depressive symptoms (changes in eating/sleeping, rumination, somewhat ambivalent outlook on the future) appear to be understandable and related to where he is in treatment at this time.  CSW discussed option of linking w a head/neck cancer mentor and continued follow up w both nurse navigator and CSW.  As it is difficult/painful for him to talk, CSW did not prolong the conversation and will check in w him by phone in two weeks.  Will communicate above w treatment team.  Edwyna Shell, LCSW Clinical Social Worker Phone:  [phone]

## 2020-05-31 NOTE — Patient Instructions (Signed)
Nausea and Vomiting, Adult Nausea is feeling sick to your stomach or feeling that you are about to throw up (vomit). Vomiting is when food in your stomach is thrown up and out of the mouth. Throwing up can make you feel weak. It can also make you lose too much water in your body (get dehydrated). If you lose too much water in your body, you may:  Feel tired.  Feel thirsty.  Have a dry mouth.  Have cracked lips.  Go pee (urinate) less often. Older adults and people with other diseases or a weak body defense system (immune system) are at higher risk for losing too much water in the body. If you feel sick to your stomach and you throw up, it is important to follow instructions from your doctor about how to take care of yourself. Follow these instructions at home: Watch your symptoms for any changes. Tell your doctor about them. Follow these instructions to care for yourself at home. Eating and drinking      Take an ORS (oral rehydration solution). This is a drink that is sold at pharmacies and stores.  Drink clear fluids in small amounts as you are able, such as: ? Water. ? Ice chips. ? Fruit juice that has water added (diluted fruit juice). ? Low-calorie sports drinks.  Eat bland, easy-to-digest foods in small amounts as you are able, such as: ? Bananas. ? Applesauce. ? Rice. ? Low-fat (lean) meats. ? Toast. ? Crackers.  Avoid drinking fluids that have a lot of sugar or caffeine in them. This includes energy drinks, sports drinks, and soda.  Avoid alcohol.  Avoid spicy or fatty foods. General instructions  Take over-the-counter and prescription medicines only as told by your doctor.  Drink enough fluid to keep your pee (urine) pale yellow.  Wash your hands often with soap and water. If you cannot use soap and water, use hand sanitizer.  Make sure that all people in your home wash their hands well and often.  Rest at home while you get better.  Watch your condition  for any changes.  Take slow and deep breaths when you feel sick to your stomach.  Keep all follow-up visits as told by your doctor. This is important. Contact a doctor if:  Your symptoms get worse.  You have new symptoms.  You have a fever.  You cannot drink fluids without throwing up.  You feel sick to your stomach for more than 2 days.  You feel light-headed or dizzy.  You have a headache.  You have muscle cramps.  You have a rash.  You have pain while peeing. Get help right away if:  You have pain in your chest, neck, arm, or jaw.  You feel very weak or you pass out (faint).  You throw up again and again.  You have throw up that is bright red or looks like black coffee grounds.  You have bloody or black poop (stools) or poop that looks like tar.  You have a very bad headache, a stiff neck, or both.  You have very bad pain, cramping, or bloating in your belly (abdomen).  You have trouble breathing.  You are breathing very quickly.  Your heart is beating very quickly.  Your skin feels cold and clammy.  You feel confused.  You have signs of losing too much water in your body, such as: ? Dark pee, very little pee, or no pee. ? Cracked lips. ? Dry mouth. ? Sunken eyes. ?  Sleepiness. ? Weakness. These symptoms may be an emergency. Do not wait to see if the symptoms will go away. Get medical help right away. Call your local emergency services (911 in the [place] Do not drive yourself to the hospital. Summary  Nausea is feeling sick to your stomach or feeling that you are about to throw up (vomit). Vomiting is when food in your stomach is thrown up and out of the mouth.  Follow instructions from your doctor about eating and drinking to keep from losing too much water in your body.  Take over-the-counter and prescription medicines only as told by your doctor.  Contact your doctor if your symptoms get worse or you have new symptoms.  Keep all follow-up  visits as told by your doctor. This is important. This information is not intended to replace advice given to you by your health care provider. Make sure you discuss any questions you have with your health care provider. Document Revised: 11/12/2018 Document Reviewed: 12/29/2017 Elsevier Patient Education  [employer].   Rehydration, Adult Rehydration is the replacement of body fluids and salts and minerals (electrolytes) that are lost during dehydration. Dehydration is when there is not enough fluid or water in the body. This happens when you lose more fluids than you take in. Common causes of dehydration include: Vomiting. Diarrhea. Excessive sweating, such as from heat exposure or exercise. Taking medicines that cause the body to lose excess fluid (diuretics). Impaired kidney function. Not drinking enough fluid. Certain illnesses or infections. Certain poorly controlled long-term (chronic) illnesses, such as diabetes, heart disease, and kidney disease.  Symptoms of mild dehydration may include thirst, dry lips and mouth, dry skin, and dizziness. Symptoms of severe dehydration may include increased heart rate, confusion, fainting, and not urinating. You can rehydrate by drinking certain fluids or getting fluids through an IV tube, as told by your health care provider. What are the risks? Generally, rehydration is safe. However, one problem that can happen is taking in too much fluid (overhydration). This is rare. If overhydration happens, it can cause an electrolyte imbalance, kidney failure, or a decrease in salt (sodium) levels in the body. How to rehydrate Follow instructions from your health care provider for rehydration. The kind of fluid you should drink and the amount you should drink depend on your condition. If directed by your health care provider, drink an oral rehydration solution (ORS). This is a drink designed to treat dehydration that is found in pharmacies and retail  stores. Make an ORS by following instructions on the package. Start by drinking small amounts, about  cup (120 mL) every 5-10 minutes. Slowly increase how much you drink until you have taken the amount recommended by your health care provider. Drink enough clear fluids to keep your urine clear or pale yellow. If you were instructed to drink an ORS, finish the ORS first, then start slowly drinking other clear fluids. Drink fluids such as: Water. Do not drink only water. Doing that can lead to having too little sodium in your body (hyponatremia). Ice chips. Fruit juice that you have added water to (diluted juice). Low-calorie sports drinks. If you are severely dehydrated, your health care provider may recommend that you receive fluids through an IV tube in the hospital. Do not take sodium tablets. Doing that can lead to the condition of having too much sodium in your body (hypernatremia). Eating while you rehydrate Follow instructions from your health care provider about what to eat while you rehydrate. Your  health care provider may recommend that you slowly begin eating regular foods in small amounts. Eat foods that contain a healthy balance of electrolytes, such as bananas, oranges, potatoes, tomatoes, and spinach. Avoid foods that are greasy or contain a lot of fat or sugar.  In some cases, you may get nutrition through a feeding tube that is passed through your nose and into your stomach (nasogastric tube, or NG tube). This may be done if you have uncontrolled vomiting or diarrhea. Beverages to avoid Certain beverages may make dehydration worse. While you rehydrate, avoid: Alcohol. Caffeine. Drinks that contain a lot of sugar. These include: High-calorie sports drinks. Fruit juice that is not diluted. Soda.  Check nutrition labels to see how much sugar or caffeine a beverage contains. Signs of dehydration recovery You may be recovering from dehydration if: You are urinating more often  than before you started rehydrating. Your urine is clear or pale yellow. Your energy level improves. You vomit less frequently. You have diarrhea less frequently. Your appetite improves or returns to normal. You feel less dizzy or less light-headed. Your skin tone and color start to look more normal. Contact a health care provider if: You continue to have symptoms of mild dehydration, such as: Thirst. Dry lips. Slightly dry mouth. Dry, warm skin. Dizziness. You continue to vomit or have diarrhea. Get help right away if: You have symptoms of dehydration that get worse. You feel: Confused. Weak. Like you are going to faint. You have not urinated in 6-8 hours. You have very dark urine. You have trouble breathing. Your heart rate while sitting still is over 100 beats a minute. You cannot drink fluids without vomiting. You have vomiting or diarrhea that: Gets worse. Does not go away. You have a fever. This information is not intended to replace advice given to you by your health care provider. Make sure you discuss any questions you have with your health care provider. Document Revised: 07/03/2017 Document Reviewed: 09/14/2015 Elsevier Patient Education  2020 [employer].

## 2020-05-31 NOTE — Progress Notes (Addendum)
RN requested nutrition follow-up during IV fluids. Patient reports he has had nausea and vomiting and is unable to tolerate Osmolite 1.5 via feeding tube. 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   No recent weight was documented. Patient reports he has been taking lorazepam for nausea but did not know what some of his other medications were for so he has not taken them.  He is confused about medications that can be crushed and put into the feeding tube. Medications include lorazepam, Reglan, Zofran, Compazine, and scopolamine. Reports he is not able to swallow anything.  He is struggling to drink sips of water. Reports vomiting after giving 1 carton of Osmolite 1.5 this morning.  Estimated nutrition needs: 1800-2100 cal, 90-105 g protein, 1.8 L fluid.  Osmolite 1.5, 1-1/2 cartons 4 times daily with 60 mL free water before and after each bolus feeding +240 mL free water 3 times daily by mouth or tube provides 2130 cal, 89.4 g protein, and 2286 mL of free water meeting greater than 90% estimated nutrition needs.  Nutrition diagnosis: Inadequate oral intake continues.  Intervention: Patient educated to take nausea medications as prescribed.  Asked RN to write out a nausea medication schedule for patient and educate on crushing medications.  RN agrees. Educated patient to be sure to flush feeding tube with a minimum of 30 mL of free water after each medication. Reminded him to take nausea medication and wait 15 to 30 minutes before giving tube feeding. Patient wants to continue with Osmolite 1.5 as he has tolerated this in the past. Educated patient to begin with 1 carton Osmolite 1.5, 4 times daily and increase as tolerated to goal rate of 1-1/2 cartons 4 times a day with 60 mL of free water before and after bolus feedings. Drink or give 240 mL free water 3 times daily via feeding tube between feedings. Patient has plenty of tube feeding and  supplies.  He denies other needs.  Monitoring, evaluation, goals: Patient will tolerate tube feeding and increase to goal rate to minimize further weight loss.  Next visit: Friday, November 5 by telephone.  Sooner if needed.  He has my contact information.  **Disclaimer: This note was dictated with voice recognition software. Similar sounding words can inadvertently be transcribed and this note may contain transcription errors which may not have been corrected upon publication of note.**

## 2020-06-01 ENCOUNTER — Other Ambulatory Visit: Payer: Self-pay | Admitting: General Practice

## 2020-06-01 ENCOUNTER — Inpatient Hospital Stay: Payer: Medicaid Other

## 2020-06-01 ENCOUNTER — Other Ambulatory Visit: Payer: Self-pay

## 2020-06-01 ENCOUNTER — Other Ambulatory Visit: Payer: Self-pay | Admitting: *Deleted

## 2020-06-01 ENCOUNTER — Telehealth: Payer: Self-pay | Admitting: Hematology and Oncology

## 2020-06-01 VITALS — BP 139/92 | HR 77 | Resp 18

## 2020-06-01 DIAGNOSIS — C049 Malignant neoplasm of floor of mouth, unspecified: Secondary | ICD-10-CM | POA: Diagnosis not present

## 2020-06-01 DIAGNOSIS — Z95828 Presence of other vascular implants and grafts: Secondary | ICD-10-CM

## 2020-06-01 DIAGNOSIS — R630 Anorexia: Secondary | ICD-10-CM | POA: Diagnosis not present

## 2020-06-01 DIAGNOSIS — C770 Secondary and unspecified malignant neoplasm of lymph nodes of head, face and neck: Secondary | ICD-10-CM | POA: Diagnosis not present

## 2020-06-01 DIAGNOSIS — Z5111 Encounter for antineoplastic chemotherapy: Secondary | ICD-10-CM | POA: Diagnosis not present

## 2020-06-01 MED ORDER — SODIUM CHLORIDE 0.9 % IV SOLN
Freq: Once | INTRAVENOUS | Status: AC
  Filled 2020-06-01: qty 1000

## 2020-06-01 MED ORDER — SODIUM CHLORIDE 0.9% FLUSH
10.0000 mL | Freq: Once | INTRAVENOUS | Status: AC
  Administered 2020-06-01: 10 mL via INTRAVENOUS
  Filled 2020-06-01: qty 10

## 2020-06-01 MED ORDER — HEPARIN SOD (PORK) LOCK FLUSH 100 UNIT/ML IV SOLN
500.0000 [IU] | Freq: Once | INTRAVENOUS | Status: AC
  Administered 2020-06-01: 500 [IU] via INTRAVENOUS
  Filled 2020-06-01: qty 5

## 2020-06-01 MED ORDER — SODIUM CHLORIDE 0.9 % IV SOLN
Freq: Once | INTRAVENOUS | Status: DC
  Filled 2020-06-01: qty 250

## 2020-06-01 NOTE — Telephone Encounter (Signed)
Scheduled appt per 10/29 sch msg - called pt . No answer. Left message with apt date and time

## 2020-06-01 NOTE — Patient Instructions (Signed)

## 2020-06-04 ENCOUNTER — Inpatient Hospital Stay: Payer: Medicaid Other | Attending: Hematology

## 2020-06-04 ENCOUNTER — Other Ambulatory Visit: Payer: Self-pay

## 2020-06-04 VITALS — BP 142/83 | HR 77 | Temp 98.3°F | Resp 18

## 2020-06-04 DIAGNOSIS — G893 Neoplasm related pain (acute) (chronic): Secondary | ICD-10-CM | POA: Diagnosis not present

## 2020-06-04 DIAGNOSIS — C049 Malignant neoplasm of floor of mouth, unspecified: Secondary | ICD-10-CM | POA: Diagnosis not present

## 2020-06-04 DIAGNOSIS — Z923 Personal history of irradiation: Secondary | ICD-10-CM | POA: Insufficient documentation

## 2020-06-04 DIAGNOSIS — R5383 Other fatigue: Secondary | ICD-10-CM | POA: Insufficient documentation

## 2020-06-04 DIAGNOSIS — B370 Candidal stomatitis: Secondary | ICD-10-CM | POA: Diagnosis not present

## 2020-06-04 DIAGNOSIS — Z9221 Personal history of antineoplastic chemotherapy: Secondary | ICD-10-CM | POA: Diagnosis not present

## 2020-06-04 DIAGNOSIS — Z79899 Other long term (current) drug therapy: Secondary | ICD-10-CM | POA: Diagnosis not present

## 2020-06-04 DIAGNOSIS — C770 Secondary and unspecified malignant neoplasm of lymph nodes of head, face and neck: Secondary | ICD-10-CM | POA: Insufficient documentation

## 2020-06-04 MED ORDER — SODIUM CHLORIDE 0.9% FLUSH
10.0000 mL | Freq: Once | INTRAVENOUS | Status: AC
  Administered 2020-06-04: 10 mL via INTRAVENOUS
  Filled 2020-06-04: qty 10

## 2020-06-04 MED ORDER — SODIUM CHLORIDE 0.9 % IV SOLN
Freq: Once | INTRAVENOUS | Status: AC
  Filled 2020-06-04: qty 1000

## 2020-06-04 MED ORDER — HEPARIN SOD (PORK) LOCK FLUSH 100 UNIT/ML IV SOLN
500.0000 [IU] | Freq: Once | INTRAVENOUS | Status: AC
  Administered 2020-06-04: 500 [IU] via INTRAVENOUS
  Filled 2020-06-04: qty 5

## 2020-06-04 NOTE — Progress Notes (Signed)
The patient was seen in the infusion room today.  He reports that he has been having GERD, nausea and vomiting with his tube feeds.  His medications were reviewed with him.  It was determined that he has Reglan at home but was unsure of its use.  He was told to use 5 to 10 mL 4 times daily before meals and at bedtime.  He also requested a refill of his fentanyl patches.  It was determined that these had already been sent by Dr. Jana Hakim.  Patient had no other issues of concern.  Sandi Mealy, MHS, PA-C Physician Assistant

## 2020-06-07 ENCOUNTER — Ambulatory Visit: Payer: Self-pay | Admitting: Nutrition

## 2020-06-07 ENCOUNTER — Ambulatory Visit: Payer: Medicaid Other | Attending: Radiation Oncology

## 2020-06-07 ENCOUNTER — Other Ambulatory Visit: Payer: Self-pay

## 2020-06-07 ENCOUNTER — Inpatient Hospital Stay: Payer: Medicaid Other | Admitting: General Practice

## 2020-06-07 DIAGNOSIS — R1312 Dysphagia, oropharyngeal phase: Secondary | ICD-10-CM | POA: Insufficient documentation

## 2020-06-07 DIAGNOSIS — C023 Malignant neoplasm of anterior two-thirds of tongue, part unspecified: Secondary | ICD-10-CM

## 2020-06-07 NOTE — Progress Notes (Signed)
Casey [name] presents today for 2 week follow-up after completing radiation to his tongue/floor of mouth on 05/23/2020  Pain issues, if any: Continues to have mouth/gum/tongue discomfort. All areas are very sensitive and often feel like they're burning with anything other than water/milk. He is managing with PRN pain medication prescribed by Dr. Nicholas Lose Using a feeding tube?: Yes--reports doing 4-5 feedings a day. 06/07/20 met with Raford Pitcher Neff-RD: "Patient reports he is tolerating approximately 4 cartons of Osmolite 1.5 via PEG. At most he may consume 5 cartons a day but this is rare.  He states he cannot tolerate more volume because his stomach is full. Reports drinking a half of glass of chocolate milk yesterday. He wants to begin [employer] essentials which he has enjoyed in the past. Patient reports nausea has improved. He is experiencing constipation and has been taking stool softeners. Increase oral intake to include 2 Carnation breakfast essentials daily.  Begin very soft moist foods as tolerated. Continue adequate water by mouth." Weight changes, if any:  Wt Readings from Last 3 Encounters:  06/08/20 118 lb (53.5 kg)  06/07/20 117 lb (53.1 kg)  05/28/20 121 lb 8 oz (55.1 kg)   Swallowing issues, if any: Still not able to tolerate soft or solid foods, but reports he can manage thin liquids without difficulty. 06/07/20 met with Marin Olp: "At this time pt swallowing is deemed WNL/WFL with water - SLP encouraged pt to attempt wider variety POs with liquids as well as some very soft purees. SLP reviewed pt's individualized HEP for dysphagia with rare min cues necessary. SLP encouraged pt to try to meet suggested frequency of BID again as he had decr'd frequency with incr'd pain. There are no overt s/s aspiration reported by pt at this time" Smoking or chewing tobacco? None Using fluoride trays daily? N/A Last ENT visit was on: Not since diagnosis Other notable issues, if any:  Continues to deal with fatigue. Often has to nap during the day, and then sleeps "on and off" during the night. Denies any ear or jaw pain, or difficulty opening his mouth. Reports hearing seems "off" (more sensitive now in certain situations), but he's hoping to see an audiologist soon. Continues to deal with thick and copious secretions (still wearing scopolamine patch). Mild lymphedema on right side of neck (though improved since before completing treatment). Skin in treatment field appears intact and redness greatly improved, and patient confirms that his sister continues to help him apply Sonafine to area.   Vitals:   06/08/20 1427  BP: 125/87  Pulse: 67  Resp: 18  Temp: 97.9 F (36.6 C)  SpO2: 100%

## 2020-06-07 NOTE — Progress Notes (Signed)
[place] CSW Progress Notes  Met w patient in office.  He is approx two weeks post radiation and chemotherapy.  He appears much brighter and optimistic than two weeks ago when he was struggling with significant pain in speaking. As the difficulty with speech gets better, he is more able to communicate with others - this is an important value to him.  He has always been a sociable and engaged person - not being able to talk and be understood has been extremely frustrating for him.  He has been granted disability - he is aware that he will need to seek health insurance under the [employer] as he will receive SSDI.  He looks forward to reconnecting with his [place] - this will become easier as he heals post treatment and as COVID concerns subside.  He continues to live with his sister, but looks forward to being able to have his own place at some point.  He would like to get back to the gym - exercise is important to him.  Provided information on the [employer] for [employer] and [employer] at the [place].  He has my contact information and knows to call as needed.  Edwyna Shell, LCSW Clinical Social Worker Phone:  [phone] Cell:  [phone]

## 2020-06-07 NOTE — Progress Notes (Signed)
Nutrition follow-up completed with patient after head and neck clinic. Weight decreased and documented as 117 pounds today down from 121.5 pounds October 25. Patient reports he is tolerating approximately 4 cartons of Osmolite 1.5 via PEG. At most he may consume 5 cartons a day but this is rare.  He states he cannot tolerate more volume because his stomach is full. Reports drinking a half of glass of chocolate milk yesterday. He wants to begin [employer] essentials which he has enjoyed in the past. Patient reports nausea has improved. He is experiencing constipation and has been taking stool softeners. Patient has enough tube feeding supplies but is requesting an additional package of Mesh briefs.  Estimated nutrition needs: 1800-2100 cal, 90-105 g protein, 1.8 L fluid.  4 cartons of Osmolite 1.5 provides 1420 cal, 59.6 g protein, 725 mL free water.  Nutrition diagnosis: Inadequate oral intake continues.  Intervention: Educated patient to strive to use 4 cartons of Osmolite 1.5 via PEG at a minimum every day. Continue free water by mouth as tolerated. Increase oral intake to include 2 Carnation breakfast essentials daily.  Begin very soft moist foods as tolerated. Continue adequate water by mouth. Provided 1 package of mesh briefs.  Monitoring, evaluation, goals: Patient will tolerate adequate calories and protein to minimize further weight loss and promote healing.  Next visit: Monday, November 29 after dental visit.  **Disclaimer: This note was dictated with voice recognition software. Similar sounding words can inadvertently be transcribed and this note may contain transcription errors which may not have been corrected upon publication of note.**

## 2020-06-07 NOTE — Therapy (Signed)
[facility] [address] [place], [place], [address] Phone: [phone]   Fax:  [phone]  Speech Language Pathology Treatment  Patient Details  Name: Casey [name] MRN: [id number] Date of Birth: Feb 04, 1966 Referring Provider (SLP): Eppie Gibson, MD   Encounter Date: 06/07/2020   End of Session - 06/07/20 1558    Visit Number 2    Number of Visits 7    Date for SLP Re-Evaluation 07/18/20    SLP Start Time 1324    SLP Stop Time  1402    SLP Time Calculation (min) 38 min    Activity Tolerance Patient tolerated treatment well           Past Medical History:  Diagnosis Date  . Cancer [facility])     Past Surgical History:  Procedure Laterality Date  . IR GASTROSTOMY TUBE MOD SED  04/03/2020  . IR IMAGING GUIDED PORT INSERTION  04/03/2020    There were no vitals filed for this visit.   Subjective Assessment - 06/07/20 1325    Subjective "ive been doing the "eeeee" but that's about it." Pt c/o    Currently in Pain? Yes    Pain Score 5     Pain Location Mouth    Pain Orientation Lower    Pain Descriptors / Indicators Discomfort;Sore;Burning    Pain Type Acute pain    Pain Onset 1 to 4 weeks ago    Pain Frequency Constant                 ADULT SLP TREATMENT - 06/07/20 1333      General Information   Behavior/Cognition Alert      Treatment Provided   Treatment provided Dysphagia      Dysphagia Treatment   Temperature Spikes Noted No    Respiratory Status Room air    Oral Cavity - Dentition Dentures, not available    Treatment Methods Skilled observation;Therapeutic exercise;Compensation strategy training;Patient/caregiver education    Patient observed directly with PO's Yes    Type of PO's observed Thin liquids    Oral Phase Signs & Symptoms --   none   Pharyngeal Phase Signs & Symptoms --   cough first of 7 sips, none after that   Other treatment/comments Pt was performing HEP  once/day routingely until his last chemo and then he trailed off until yesterday. SLP reiterated doing non-swallwoing exercises until pain/thick saliva subside a bit. Pt acknowleded understaiding. Pt told SLP 3 overt s/sx aspiration PNA.  SLP shared with pt about food journal (see "pt education).  SLP encouraged pt to eat/drink wider variety of POs including liquids and pureeds.       Assessment / Recommendations / Plan   Plan Continue with current plan of care      Progression Toward Goals   Progression toward goals Progressing toward goals            SLP Education - 06/07/20 1557    Education Details food journal, ocnt to try POs with drinks and dys I items, overt s/sx aspiration PNA    Person(s) Educated Patient    Methods Explanation    Comprehension Verbalized understanding            SLP Short Term Goals - 06/07/20 1601      SLP SHORT TERM GOAL #1   Title pt will complete HEP with rare min A    Time 1    Period --   visits, for all STGs  Status On-going      SLP SHORT TERM GOAL #2   Title pt will tell SLP why pt is completing HEP with modified independence in 2 sessions    Baseline 06-07-20    Time 2    Status On-going      SLP SHORT TERM GOAL #3   Title pt will tell SLP how a food journal could hasten return to a more normalized diet    Status Achieved      SLP SHORT TERM GOAL #4   Title pt will describe 3 overt s/s aspiration PNA with modified independence    Time --    Status Achieved            SLP Long Term Goals - 06/07/20 1605      SLP LONG TERM GOAL #1   Title pt will complete HEP with modified independence over two visits    Time 3    Period --   visits, for all LTGs   Status On-going      SLP LONG TERM GOAL #2   Title pt will describe how to modify HEP over time, and the timeline associated with reduction in HEP frequency with modified independence over two sessions    Time 5    Status On-going            Plan - 06/07/20 1558     Clinical Impression Statement At this time pt swallowing is deemed WNL/WFL with water - SLP encouraged pt to attempt wider variety POs with liquids as well as some very soft purees. SLP reviewed pt's individualized HEP for dysphagia with rare min cues necessary. SLP encouraged pt to try to meet suggested frequency of BID again as he had decr'd frequency with incr'd pain. There are no overt s/s aspiration reported by pt at this time. Data indicate that pt's swallow ability will likely decrease over the course of radiation therapy and could very well decline over time following conclusion of their radiation therapy due to muscle disuse atrophy and/or muscle fibrosis. Pt will cont to need to be seen by SLP in order to assess safety of PO intake, assess the need for recommending any objective swallow assessment, and ensuring pt correctly completes the individualized HEP.    Speech Therapy Frequency --   once approx every 4 weeks   Duration --   7 total visits   Treatment/Interventions Aspiration precaution training;Pharyngeal strengthening exercises;Diet toleration management by SLP;Trials of upgraded texture/liquids;Patient/family education;Compensatory strategies;Internal/external aids;SLP instruction and feedback    Potential to Achieve Goals Good    SLP Home Exercise Plan provided today    Consulted and Agree with Plan of Care Patient           Patient will benefit from skilled therapeutic intervention in order to improve the following deficits and impairments:   Dysphagia, oropharyngeal phase    Problem List Patient Active Problem List   Diagnosis Date Noted  . 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 [place], [place], [address] Phone: [phone]   Fax:   [phone]   Name: Casey [name] MRN: [id number] Date of Birth: [birth year]

## 2020-06-08 ENCOUNTER — Telehealth: Payer: Self-pay | Admitting: General Practice

## 2020-06-08 ENCOUNTER — Other Ambulatory Visit: Payer: Self-pay

## 2020-06-08 ENCOUNTER — Ambulatory Visit
Admission: RE | Admit: 2020-06-08 | Discharge: 2020-06-08 | Disposition: A | Discharge status: Home / Self Care | Payer: Medicaid Other | Source: Ambulatory Visit | Attending: Radiation Oncology | Admitting: Radiation Oncology

## 2020-06-08 VITALS — BP 125/87 | HR 67 | Temp 97.9°F | Resp 18 | Ht 63.0 in | Wt 118.0 lb

## 2020-06-08 DIAGNOSIS — C049 Malignant neoplasm of floor of mouth, unspecified: Secondary | ICD-10-CM

## 2020-06-08 DIAGNOSIS — C023 Malignant neoplasm of anterior two-thirds of tongue, part unspecified: Secondary | ICD-10-CM

## 2020-06-08 LAB — CBC WITH DIFFERENTIAL (CANCER CENTER ONLY)
Abs Immature Granulocytes: 0.01 10*3/uL (ref 0.00–0.07)
Basophils Absolute: 0 10*3/uL (ref 0.0–0.1)
Basophils Relative: 1 %
Eosinophils Absolute: 0.1 10*3/uL (ref 0.0–0.5)
Eosinophils Relative: 3 %
HCT: 31.4 % — ABNORMAL LOW (ref 39.0–52.0)
Hemoglobin: 10.7 g/dL — ABNORMAL LOW (ref 13.0–17.0)
Immature Granulocytes: 0 %
Lymphocytes Relative: 26 %
Lymphs Abs: 1 10*3/uL (ref 0.7–4.0)
MCH: 32.7 pg (ref 26.0–34.0)
MCHC: 34.1 g/dL (ref 30.0–36.0)
MCV: 96 fL (ref 80.0–100.0)
Monocytes Absolute: 0.7 10*3/uL (ref 0.1–1.0)
Monocytes Relative: 18 %
Neutro Abs: 2 10*3/uL (ref 1.7–7.7)
Neutrophils Relative %: 52 %
Platelet Count: 235 10*3/uL (ref 150–400)
RBC: 3.27 MIL/uL — ABNORMAL LOW (ref 4.22–5.81)
RDW: 14.1 % (ref 11.5–15.5)
WBC Count: 3.7 10*3/uL — ABNORMAL LOW (ref 4.0–10.5)
nRBC: 0 % (ref 0.0–0.2)

## 2020-06-08 LAB — BASIC METABOLIC PANEL - CANCER CENTER ONLY
Anion gap: 12 (ref 5–15)
BUN: 18 mg/dL (ref 6–20)
CO2: 28 mmol/L (ref 22–32)
Calcium: 9.4 mg/dL (ref 8.9–10.3)
Chloride: 96 mmol/L — ABNORMAL LOW (ref 98–111)
Creatinine: 0.99 mg/dL (ref 0.61–1.24)
GFR, Estimated: >60 mL/min (ref >60)
Glucose, Bld: 102 mg/dL — ABNORMAL HIGH (ref 70–99)
Potassium: 4.7 mmol/L (ref 3.5–5.1)
Sodium: 136 mmol/L (ref 135–145)

## 2020-06-08 NOTE — Telephone Encounter (Signed)
Casey [name]  Call from patient sister Casey [name] [phone]).  States patient has been awarded [employer] disability, to start mid December. His disability comes w Medicare after 24 months.  She is concerned about his need for Medicaid currently - referred her to [employer] ([place] [phone]) who is handling his case and can provide updates and more information.  VM left for sister w this information.   Edwyna Shell, LCSW Clinical Social Worker Phone:  [phone]

## 2020-06-11 ENCOUNTER — Other Ambulatory Visit: Payer: Self-pay

## 2020-06-11 DIAGNOSIS — C049 Malignant neoplasm of floor of mouth, unspecified: Secondary | ICD-10-CM

## 2020-06-12 ENCOUNTER — Encounter: Payer: Self-pay | Admitting: Radiation Oncology

## 2020-06-12 NOTE — Progress Notes (Signed)
Radiation Oncology         [phone] ________________________________  Name: Oluwatomiwa Kinyon Buzzell MRN: [id number]  Date: 06/08/2020  DOB: 1966/03/29  Follow-Up Visit Note  CC: Pcp, No  Serena Croissant, MD  Diagnosis and Prior Radiotherapy:       ICD-10-CM   1. 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 Stanco presents today for 2 week follow-up after completing definitive chemoradiation to his tongue/floor of mouth and bilateral neck on 05/23/2020 (surgical resection declined); his sister is present by speaker phone.  He is concerned that he has not had lab work as frequently as he desires for reassurance and requests that lab work be ordered today.  Pain issues, if any: Continues to have mouth/gum/tongue discomfort. All areas are very sensitive and often feel like they're burning with anything other than water/milk. He is managing with PRN pain medication prescribed by Dr. Serena Croissant  Using a feeding tube?: Yes--reports doing 4-5 feedings a day. 06/07/20 met with Lesle Reek Neff-RD: "Patient reports he is tolerating approximately 4 cartons of Osmolite 1.5 via PEG. At most he may consume 5 cartons a day but this is rare.  He states he cannot tolerate more volume because his stomach is full. Reports drinking a half of glass of chocolate milk yesterday. He wants to begin [employer] essentials which he has enjoyed in the past. Patient reports  nausea has improved. He is experiencing constipation and has been taking stool softeners. Increase oral intake to include 2 [employer] breakfast essentials daily.  Begin very soft moist foods as tolerated. Continue adequate water by mouth." Weight changes, if any:  Wt Readings from Last 3 Encounters:  06/08/20 118 lb (53.5 kg)  06/07/20 117 lb (53.1 kg)  05/28/20 121 lb 8 oz (55.1 kg)   Swallowing issues, if any: Still not able to tolerate soft or solid foods, but reports he can manage thin liquids without difficulty. 06/07/20 met with Odetta Pink: "At this time pt swallowing is deemed WNL/WFL with water - SLP encouraged pt to attempt wider variety POs with liquids as well as some very soft purees. SLP reviewed pt's individualized HEP for dysphagia with rare min cues necessary. SLP encouraged pt to try to meet suggested frequency of BID again as he had decr'd frequency with incr'd pain. There are no overt s/s aspiration reported by pt at this time" Smoking or chewing tobacco? None Using fluoride trays daily? N/A Last ENT visit was on: Not since diagnosis Other notable issues, if any: Continues to deal with fatigue. Often has to nap during the day, and then sleeps "on and off" during the night. Denies any ear or jaw pain, or difficulty opening his mouth. Reports hearing seems "off" (more sensitive now in certain situations), but he's hoping to see an audiologist soon. Continues to deal with thick and copious secretions (still wearing scopolamine patch). Mild lymphedema on right side of  neck (though improved since before completing treatment). Skin in treatment field appears intact and redness greatly improved, and patient confirms that his sister continues to help him apply Sonafine to area.   He expresses that he would like to see medical oncology (Dr. Lindi Adie in the near future).     Vitals:   06/08/20 1427  BP: 125/87  Pulse: 67  Resp: 18  Temp: 97.9 F (36.6 C)  SpO2: 100%                        ALLERGIES:  has No Known Allergies.  Meds: Current Outpatient Medications  Medication Sig Dispense Refill  . fentaNYL (DURAGESIC) 25 MCG/HR Place 1 patch onto the skin every 3 (three) days. 5 patch 0  . ibuprofen (ADVIL) 200 MG tablet Take 600 mg by mouth every 6 (six) hours as needed for moderate pain.    Marland Kitchen metoCLOPramide (REGLAN) 5 MG/5ML solution Take 10 mLs (10 mg total) by mouth 4 (four) times daily -  before meals and at bedtime. May flush into PEG tube if you prefer. 800 mL 4  . Multiple Vitamin (MULTIVITAMIN) tablet Take 1 tablet by mouth daily.    . Nutritional Supplements (FEEDING SUPPLEMENT, OSMOLITE 1.5 CAL,) LIQD Give 1.5 cartons of Osmolite 1.5 or equivalent via PEG 4 times daily with 60 mL free water before and after bolus feeding. Flush PEG with an additional 240 mL free water 3 times daily. Please send supplies and formula ASAP. 1422 mL 0  . oxyCODONE-acetaminophen (PERCOCET) 5-325 MG tablet Take 1 tablet by mouth every 6 (six) hours as needed for severe pain. 90 tablet 0  . polyethylene glycol (MIRALAX) 17 g packet Try 3 times daily until you have a bowel movement. Reduce to once a day once you have a movement to prevent constipation. 72 each 1  . scopolamine (TRANSDERM-SCOP, 1.5 MG,) 1 MG/3DAYS Place 1 patch (1.5 mg total) onto the skin every 3 (three) days. 10 patch 3  . dexamethasone (DECADRON) 4 MG tablet Take 1 tablet (4 mg total) by mouth daily. Take daily for 3 days after chemo. Take with food. (Patient not taking: Reported on 06/08/2020) 30 tablet 1  . lidocaine (XYLOCAINE) 2 % solution Patient: Mix 1part 2% viscous lidocaine, 1part H20. Swish/spit 24mL of diluted mixture to numb mouth, q 2hrs PRN. (Patient not taking: Reported on 06/08/2020) 200 mL 4  . lidocaine-prilocaine (EMLA) cream Apply to affected area once (Patient not taking: Reported on 03/20/2020) 30 g 3  . LORazepam (ATIVAN) 0.5 MG tablet Take 1 tablet (0.5 mg total) by mouth at bedtime as needed (Nausea  or vomiting). (Patient not taking: Reported on 03/20/2020) 30 tablet 1  . ondansetron (ZOFRAN) 8 MG tablet Take 1 tablet (8 mg total) by mouth 2 (two) times daily as needed. Start on the third day after chemotherapy. (Patient not taking: Reported on 03/20/2020) 30 tablet 1  . prochlorperazine (COMPAZINE) 10 MG tablet Take 1 tablet (10 mg total) by mouth every 6 (six) hours as needed (Nausea or vomiting). (Patient not taking: Reported on 03/20/2020) 30 tablet 1   No current facility-administered medications for this encounter.    Physical Findings: The patient is in no acute distress. Patient is alert and oriented. Wt Readings from Last 3 Encounters:  06/08/20 118 lb (53.5 kg)  06/07/20 117 lb (53.1 kg)  05/28/20 121 lb 8 oz (55.1 kg)    height is $RemoveB'5\' 3"'uivxtioB$  (1.6 m) and weight is 118  lb (53.5 kg). His temperature is 97.9 F (36.6 C). His blood pressure is 125/87 and his pulse is 67. His respiration is 18 and oxygen saturation is 100%. .  General: Alert and oriented, in no acute distress HEENT: Head is normocephalic. Extraocular movements are intact. Oropharynx is notable for no lesions in the upper throat.  His tongue continues to have swelling but is much pinker and no longer purplish as it was pretreatment.  He has a subcentimeter whitish ulcer that is round with regular borders at the anterior tongue. Neck: Neck is notable for a rounded mobile mass at level 1B, right neck.  This mass is approximately 2 cm in dimension Skin: Skin in treatment fields shows satisfactory healing    Lab Findings: Lab Results  Component Value Date   WBC 3.7 (L) 06/08/2020   HGB 10.7 (L) 06/08/2020   HCT 31.4 (L) 06/08/2020   MCV 96.0 06/08/2020   PLT 235 06/08/2020    Lab Results  Component Value Date   TSH 0.973 03/26/2020   CMP     Component Value Date/Time   NA 136 06/08/2020 1607   K 4.7 06/08/2020 1607   CL 96 (L) 06/08/2020 1607   CO2 28 06/08/2020 1607   GLUCOSE 102 (H) 06/08/2020 1607   BUN 18  06/08/2020 1607   CREATININE 0.99 06/08/2020 1607   CALCIUM 9.4 06/08/2020 1607   PROT 8.1 04/03/2020 0816   ALBUMIN 4.6 04/03/2020 0816   AST 33 04/03/2020 0816   ALT 25 04/03/2020 0816   ALKPHOS 65 04/03/2020 0816   BILITOT 1.0 04/03/2020 0816   GFRNONAA >60 06/08/2020 1607   GFRAA >60 05/03/2020 0940    Radiographic Findings: No results found.  Impression/Plan:    1) Head and Neck Cancer Status: He is healing from chemoradiation therapy.  He still has adenopathy at right level 1B of his neck.  The patient is monitoring this and will let his providers know if this grows.  In the case that it does not grow or it slowly shrinks, we will plan on restaging him with a PET scan in late January to allow inflammation to settle down in the head and neck region.  2) Nutritional Status: Continue to supplement with PEG tube and follow the advice of nutritionist   Wt Readings from Last 3 Encounters:  06/08/20 118 lb (53.5 kg)  06/07/20 117 lb (53.1 kg)  05/28/20 121 lb 8 oz (55.1 kg)    3) Risk Factors: The patient has been educated about risk factors including alcohol and tobacco abuse; they understand that avoidance of alcohol and tobacco is important to prevent recurrences as well as other cancers  4) Swallowing: Continue speech-language pathology exercises  5) Thyroid function: Check annually in medical oncology Lab Results  Component Value Date   TSH 0.973 03/26/2020    6) Other: He needs a local ear nose and throat doctor. [name] will navigate him to a Surveyor, quantity.  He has been lost to follow-up with medical oncology and she will navigate this as well.  He requests labs today and I will order them for reassurance.  7) Follow-up in late January with restaging PET scan. The patient was encouraged to call with any issues or questions before then.  On date of service, in total, I spent 25 minutes on this encounter. Patient was seen in  person. _____________________________________   Eppie Gibson, MD

## 2020-06-13 ENCOUNTER — Other Ambulatory Visit (HOSPITAL_COMMUNITY): Payer: Self-pay | Admitting: Radiation Oncology

## 2020-06-13 MED FILL — FLUCONAZOLE 100 MG TAB: 100 | 14 days supply | Qty: 15 | Fill #0

## 2020-06-14 ENCOUNTER — Inpatient Hospital Stay: Payer: Medicaid Other | Admitting: General Practice

## 2020-06-14 NOTE — Progress Notes (Signed)
Patient Care Team: Pcp, No as PCP - General Mal[place]elt, Stephani Police, RN as Oncology Nurse Navigator Eppie Gibson, MD as Attending Physician (Radiation Oncology) Nicholas Lose, MD as Consulting Physician (Hematology and Oncology) Sharen Counter, CCC-SLP as Speech Language Pathologist (Speech Pathology) Wynelle Beckmann, Melodie Bouillon, PT as Physical Therapist (Physical Therapy) Karie Mainland, RD as Dietitian (Nutrition) Karie Mainland, RD as Dietitian (Nutrition) Beverely Pace, LCSW as Social Worker (General Practice)  DIAGNOSIS:    ICD-10-CM   1. 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 CT maxillofacial on 12/22/19 showed postsurgical changes and mandibular invasion, with cervical lymphadenopathy consistent with metastatic nodes. PET scan on 02/16/20 showed the tongue mass with lymph node metastases on the right floor of the mouth and cervical lymph nodes.   03/07/2020 Cancer Staging   Staging form: Oral Cavity, AJCC 8th Edition - Clinical: Stage IVA (cT4a, cN2b, cM0) - Signed by Nicholas Lose, MD on 03/07/2020   04/06/2020 -  Chemotherapy   The patient had dexamethasone (DECADRON) 4 MG tablet, 8 mg, Oral, Daily, 1 of 1 cycle, Start date: 04/26/2020, End date: -- palonosetron (ALOXI) injection 0.25 mg, 0.25 mg, Intravenous,  Once, 3 of 3 cycles Administration: 0.25 mg (04/06/2020), 0.25 mg (04/26/2020), 0.25 mg (05/17/2020) CISplatin (PLATINOL) 165 mg in sodium chloride 0.9 % 500 mL chemo infusion, 100 mg/m2 = 165 mg, Intravenous,  Once, 3 of 3 cycles Dose modification: 75 mg/m2 (original dose 100 mg/m2, Cycle 2, Reason: Provider  Judgment) Administration: 165 mg (04/06/2020), 124 mg (04/26/2020), 124 mg (05/17/2020) fosaprepitant (EMEND) 150 mg in sodium chloride 0.9 % 145 mL IVPB, 150 mg, Intravenous,  Once, 3 of 3 cycles Administration: 150 mg (04/06/2020), 150 mg (04/26/2020), 150 mg (05/17/2020)  for chemotherapy treatment.      CHIEF COMPLIANT: Follow-up of floor of the mouth cancer  INTERVAL HISTORY: Casey [name] is a 54 y.o. with above-mentioned history of floor of the mouthcancer treated with concurrent chemoradiation treatment withevery 3 weekcisplatin. He presents to the clinic todayfor follow-up. He is here accompanied by his sister.  He has profound swelling of the tongue as well as thrush with a whitish patch on the side of the tongue.  He has been set up to see ENT for further evaluation.  He tells me that the pain is under very good control and he is able to drink lots of water.  He does not need IV fluids.  He is taking oxycodone pain pills which appears to be working well.  He could not take morphine sulfate.  He brought the supply of liquid morphine to our clinic.  His sister thinks he needs to have counseling or support groups.  He is not very keen on those.  ALLERGIES:  has No Known Allergies.  MEDICATIONS:  Current Outpatient Medications  Medication Sig Dispense Refill  . dexamethasone (DECADRON) 1 MG tablet Take 1 tablet (1 mg total) by mouth 2 (two) times daily with a meal. 30 tablet 0  . fentaNYL (DURAGESIC) 25 MCG/HR Place 1 patch onto the skin every 3 (three) days. 10 patch 0  . fluconazole (DIFLUCAN) 100 MG tablet Take 1 tablet (  100 mg total) by mouth daily.    Marland Kitchen ibuprofen (ADVIL) 200 MG tablet Take 600 mg by mouth every 6 (six) hours as needed for moderate pain.    Marland Kitchen lidocaine (XYLOCAINE) 2 % solution Patient: Mix 1part 2% viscous lidocaine, 1part H20. Swish/spit 40mL of diluted mixture to numb mouth, q 2hrs PRN. (Patient not taking: Reported on 06/08/2020) 200 mL 4  .  lidocaine-prilocaine (EMLA) cream Apply to affected area once (Patient not taking: Reported on 03/20/2020) 30 g 3  . LORazepam (ATIVAN) 0.5 MG tablet Take 1 tablet (0.5 mg total) by mouth at bedtime as needed (Nausea or vomiting). (Patient not taking: Reported on 03/20/2020) 30 tablet 1  . metoCLOPramide (REGLAN) 5 MG/5ML solution Take 10 mLs (10 mg total) by mouth 4 (four) times daily -  before meals and at bedtime. May flush into PEG tube if you prefer. 800 mL 4  . Multiple Vitamin (MULTIVITAMIN) tablet Take 1 tablet by mouth daily.    . Nutritional Supplements (FEEDING SUPPLEMENT, OSMOLITE 1.5 CAL,) LIQD Give 1.5 cartons of Osmolite 1.5 or equivalent via PEG 4 times daily with 60 mL free water before and after bolus feeding. Flush PEG with an additional 240 mL free water 3 times daily. Please send supplies and formula ASAP. 1422 mL 0  . ondansetron (ZOFRAN) 8 MG tablet Take 1 tablet (8 mg total) by mouth 2 (two) times daily as needed. Start on the third day after chemotherapy. (Patient not taking: Reported on 03/20/2020) 30 tablet 1  . oxyCODONE-acetaminophen (PERCOCET) 5-325 MG tablet Take 1 tablet by mouth every 6 (six) hours as needed for severe pain. 90 tablet 0  . polyethylene glycol (MIRALAX) 17 g packet Try 3 times daily until you have a bowel movement. Reduce to once a day once you have a movement to prevent constipation. 72 each 1  . prochlorperazine (COMPAZINE) 10 MG tablet Take 1 tablet (10 mg total) by mouth every 6 (six) hours as needed (Nausea or vomiting). (Patient not taking: Reported on 03/20/2020) 30 tablet 1  . scopolamine (TRANSDERM-SCOP, 1.5 MG,) 1 MG/3DAYS Place 1 patch (1.5 mg total) onto the skin every 3 (three) days. 10 patch 3   No current facility-administered medications for this visit.    PHYSICAL EXAMINATION: ECOG PERFORMANCE STATUS: 2 - Symptomatic, <50% confined to bed  Vitals:   06/15/20 0949  BP: 111/82  Pulse: 82  Resp: 18  Temp: (!) 97.5 F (36.4 C)  SpO2:  99%   Filed Weights   06/15/20 0949  Weight: 119 lb 12.8 oz (54.3 kg)    LABORATORY DATA:  I have reviewed the data as listed CMP Latest Ref Rng & Units 06/15/2020 06/08/2020 05/31/2020  Glucose 70 - 99 mg/dL 137(H) 102(H) 197(H)  BUN 6 - 20 mg/dL 16 18 20   Creatinine 0.61 - 1.24 mg/dL 1.16 0.99 1.07  Sodium 135 - 145 mmol/L 136 136 134(L)  Potassium 3.5 - 5.1 mmol/L 4.5 4.7 4.4  Chloride 98 - 111 mmol/L 98 96(L) 99  CO2 22 - 32 mmol/L 30 28 27   Calcium 8.9 - 10.3 mg/dL 9.8 9.4 9.6  Total Protein 6.5 - 8.1 g/dL 7.3 - -  Total Bilirubin 0.3 - 1.2 mg/dL 0.4 - -  Alkaline Phos 38 - 126 U/L 73 - -  AST 15 - 41 U/L 28 - -  ALT 0 - 44 U/L 32 - -    Lab Results  Component Value Date   WBC 3.4 (L) 06/15/2020   HGB  10.6 (L) 06/15/2020   HCT 31.0 (L) 06/15/2020   MCV 95.7 06/15/2020   PLT 275 06/15/2020   NEUTROABS 1.5 (L) 06/15/2020    ASSESSMENT & PLAN:  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  CT maxillofacial on 12/22/19 showed postsurgical changes and mandibular invasion, with cervical lymphadenopathy consistent with metastatic nodes. MRI neck: 12/23/2019: Floor of mouth mass 3.4 cm with involvement of genioglossus muscle and erosion of inner cortex of the right mandible, multiple necrotic level 1B lymph nodes largest 1.6 cm T4AN2B M0 stage IVa clinical stage  PET scan on 02/16/20 showed the tongue mass with lymph node metastases on the right floor of the mouth and cervical lymph nodes. CK7 and CD 117 are negative, p63 and CK 5/6 are positive, p16 is negative  Treatment plan: 1.Concurrent chemoradiation with cisplatinevery 3 weeks x3 2.PEG tube and port placement 3.Patient will require IV fluids on a weekly  basis --------------------------------------------------------------------------------------------------------------------------------------- Current treatment: Cycle3cisplatin(started9/10/2019)with XRT Patient will come weekly for IV fluid hydration  Toxicities: 1. Ulceration and swelling of the tongue: I will start him on Decadron 1 mg daily.  He has an appointment to see ENT. 2. severe fatigue  3.  Severe thrush: Started on Diflucan yesterday.  I reviewed his blood work and he does not appear to be dehydrated.  Therefore we will continue his liquid oral intake as well as use tube feeds. I will request our navigator to provide him with contact information to join a head and neck cancer support group. Return to clinic in 2 weeks to assess his symptoms.  No orders of the defined types were placed in this encounter.  The patient has a good understanding of the overall plan. he agrees with it. he will call with any problems that may develop before the next visit here.  Total time spent: 30 mins including face to face time and time spent for planning, charting and coordination of care  Nicholas Lose, MD 06/15/2020  I, Cloyde Reams Dorshimer, am acting as scribe for Dr. Nicholas Lose.  I have reviewed the above documentation for accuracy and completeness, and I agree with the above.

## 2020-06-15 ENCOUNTER — Inpatient Hospital Stay: Payer: Medicaid Other

## 2020-06-15 ENCOUNTER — Other Ambulatory Visit: Payer: Self-pay | Admitting: Hematology and Oncology

## 2020-06-15 ENCOUNTER — Inpatient Hospital Stay (HOSPITAL_BASED_OUTPATIENT_CLINIC_OR_DEPARTMENT_OTHER): Payer: Medicaid Other | Admitting: Hematology and Oncology

## 2020-06-15 ENCOUNTER — Encounter: Payer: Self-pay | Admitting: *Deleted

## 2020-06-15 ENCOUNTER — Other Ambulatory Visit: Payer: Self-pay

## 2020-06-15 DIAGNOSIS — Z79899 Other long term (current) drug therapy: Secondary | ICD-10-CM | POA: Diagnosis not present

## 2020-06-15 DIAGNOSIS — Z923 Personal history of irradiation: Secondary | ICD-10-CM | POA: Diagnosis not present

## 2020-06-15 DIAGNOSIS — Z9221 Personal history of antineoplastic chemotherapy: Secondary | ICD-10-CM | POA: Diagnosis not present

## 2020-06-15 DIAGNOSIS — C049 Malignant neoplasm of floor of mouth, unspecified: Secondary | ICD-10-CM | POA: Diagnosis not present

## 2020-06-15 DIAGNOSIS — G893 Neoplasm related pain (acute) (chronic): Secondary | ICD-10-CM | POA: Diagnosis not present

## 2020-06-15 DIAGNOSIS — R5383 Other fatigue: Secondary | ICD-10-CM | POA: Diagnosis not present

## 2020-06-15 DIAGNOSIS — C770 Secondary and unspecified malignant neoplasm of lymph nodes of head, face and neck: Secondary | ICD-10-CM | POA: Diagnosis not present

## 2020-06-15 DIAGNOSIS — B370 Candidal stomatitis: Secondary | ICD-10-CM | POA: Diagnosis not present

## 2020-06-15 LAB — CBC WITH DIFFERENTIAL (CANCER CENTER ONLY)
Abs Immature Granulocytes: 0.02 10*3/uL (ref 0.00–0.07)
Basophils Absolute: 0 10*3/uL (ref 0.0–0.1)
Basophils Relative: 1 %
Eosinophils Absolute: 0.1 10*3/uL (ref 0.0–0.5)
Eosinophils Relative: 3 %
HCT: 31 % — ABNORMAL LOW (ref 39.0–52.0)
Hemoglobin: 10.6 g/dL — ABNORMAL LOW (ref 13.0–17.0)
Immature Granulocytes: 1 %
Lymphocytes Relative: 32 %
Lymphs Abs: 1.1 10*3/uL (ref 0.7–4.0)
MCH: 32.7 pg (ref 26.0–34.0)
MCHC: 34.2 g/dL (ref 30.0–36.0)
MCV: 95.7 fL (ref 80.0–100.0)
Monocytes Absolute: 0.6 10*3/uL (ref 0.1–1.0)
Monocytes Relative: 18 %
Neutro Abs: 1.5 10*3/uL — ABNORMAL LOW (ref 1.7–7.7)
Neutrophils Relative %: 45 %
Platelet Count: 275 10*3/uL (ref 150–400)
RBC: 3.24 MIL/uL — ABNORMAL LOW (ref 4.22–5.81)
RDW: 14 % (ref 11.5–15.5)
WBC Count: 3.4 10*3/uL — ABNORMAL LOW (ref 4.0–10.5)
nRBC: 0 % (ref 0.0–0.2)

## 2020-06-15 LAB — CMP (CANCER CENTER ONLY)
ALT: 32 U/L (ref 0–44)
AST: 28 U/L (ref 15–41)
Albumin: 3.8 g/dL (ref 3.5–5.0)
Alkaline Phosphatase: 73 U/L (ref 38–126)
Anion gap: 8 (ref 5–15)
BUN: 16 mg/dL (ref 6–20)
CO2: 30 mmol/L (ref 22–32)
Calcium: 9.8 mg/dL (ref 8.9–10.3)
Chloride: 98 mmol/L (ref 98–111)
Creatinine: 1.16 mg/dL (ref 0.61–1.24)
GFR, Estimated: >60 mL/min (ref >60)
Glucose, Bld: 137 mg/dL — ABNORMAL HIGH (ref 70–99)
Potassium: 4.5 mmol/L (ref 3.5–5.1)
Sodium: 136 mmol/L (ref 135–145)
Total Bilirubin: 0.4 mg/dL (ref 0.3–1.2)
Total Protein: 7.3 g/dL (ref 6.5–8.1)

## 2020-06-15 MED ORDER — FLUCONAZOLE 100 MG PO TABS: 100.0000 mg | ORAL_TABLET | Freq: Every day | ORAL | Status: DC

## 2020-06-15 MED ORDER — DEXAMETHASONE 1 MG PO TABS: 1.0000 mg | ORAL_TABLET | Freq: Two times a day (BID) | ORAL | 0 refills | Status: DC

## 2020-06-15 MED ORDER — FENTANYL 25 MCG/HR TD PT72: 1.0000 | MEDICATED_PATCH | TRANSDERMAL | 0 refills | Status: DC

## 2020-06-15 MED FILL — FENTANYL 25 MCG/HR PT72: 25 | 30 days supply | Qty: 10 | Fill #0

## 2020-06-15 MED FILL — DEXAMETHASONE 1 MG TABLET: 1 | 15 days supply | Qty: 30 | Fill #0

## 2020-06-15 NOTE — Assessment & Plan Note (Signed)
12/21/2019:In March 2021 he underwent dental etxractions and his dentists referred him for evaluation. 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  CT maxillofacial on 12/22/19 showed postsurgical changes and mandibular invasion, with cervical lymphadenopathy consistent with metastatic nodes. MRI neck: 12/23/2019: Floor of mouth mass 3.4 cm with involvement of genioglossus muscle and erosion of inner cortex of the right mandible, multiple necrotic level 1B lymph nodes largest 1.6 cm T4AN2B M0 stage IVa clinical stage  PET scan on 02/16/20 showed the tongue mass with lymph node metastases on the right floor of the mouth and cervical lymph nodes. CK7 and CD 117 are negative, p63 and CK 5/6 are positive, p16 is negative  Treatment plan: 1.Concurrent chemoradiation with cisplatinevery 3 weeks x3 2.PEG tube and port placement 3.Patient will require IV fluids on a weekly basis --------------------------------------------------------------------------------------------------------------------------------------- Current treatment: Cycle3cisplatin(started9/10/2019)with XRT Patient will come weekly for IV fluid hydration  Toxicities: 1. Ulceration and swelling of the tongue 2. severe fatigue 3. Depression

## 2020-06-15 NOTE — Progress Notes (Signed)
Pt dropped off 7 unopened bottles of 100 mg/15mL (30 mL bottles) morphine sulfate to be discarded.  RN verified amount with Leron Croak [facility].  Medication was successfully discarded at the [facility] and verified by [facility] the quantity and amount.

## 2020-06-19 ENCOUNTER — Telehealth: Payer: Self-pay | Admitting: Hematology and Oncology

## 2020-06-19 MED FILL — METOCLOPRAMIDE 5 MG/5 ML SY: 10 | 20 days supply | Qty: 800 | Fill #1

## 2020-06-19 NOTE — Telephone Encounter (Signed)
Added appt after appts that were already on pt's schedule per 11/12 los.

## 2020-06-21 ENCOUNTER — Telehealth: Payer: Self-pay | Admitting: Nutrition

## 2020-06-21 NOTE — Telephone Encounter (Signed)
Patient called concerned that he cannot afford to pay out of pocket for his Osmolite 1.5. Home Care agency will not send more until he pays upfront. I will provide a complimentary case of Osmolite 1.5 for patient to pick up next week. He will call me a day before he wants to pick it up.

## 2020-06-25 ENCOUNTER — Encounter: Payer: Self-pay | Admitting: Nutrition

## 2020-06-25 NOTE — Progress Notes (Signed)
I provided 2 complementary cases of Osmolite 1.5.

## 2020-06-26 ENCOUNTER — Other Ambulatory Visit (HOSPITAL_COMMUNITY): Payer: Self-pay | Admitting: Hematology and Oncology

## 2020-06-26 MED FILL — FLUCONAZOLE 100 MG TAB: 100 | 30 days supply | Qty: 31 | Fill #0

## 2020-06-26 MED FILL — DEXAMETHASONE 1 MG TABLET: 1 | 14 days supply | Qty: 28 | Fill #0

## 2020-07-01 NOTE — Assessment & Plan Note (Signed)
12/21/2019:In March 2021 he underwent dental etxractions and his dentists referred him for evaluation. 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  CT maxillofacial on 12/22/19 showed postsurgical changes and mandibular invasion, with cervical lymphadenopathy consistent with metastatic nodes. MRI neck: 12/23/2019: Floor of mouth mass 3.4 cm with involvement of genioglossus muscle and erosion of inner cortex of the right mandible, multiple necrotic level 1B lymph nodes largest 1.6 cm T4AN2B M0 stage IVa clinical stage  PET scan on 02/16/20 showed the tongue mass with lymph node metastases on the right floor of the mouth and cervical lymph nodes. CK7 and CD 117 are negative, p63 and CK 5/6 are positive, p16 is negative  Treatment plan: 1.Concurrent chemoradiation with cisplatinevery 3 weeks x3 2.PEG tube and port placement 3.Patient will require IV fluids on a weekly basis --------------------------------------------------------------------------------------------------------------------------------------- Current treatment: completed 3 Cycles of cisplatin(started9/10/2019)with XRT Patient will come weekly for IV fluid hydration  Toxicities: 1. Ulceration and swelling of the tongue: I will start him on Decadron 1 mg daily.  He has an appointment to see ENT. 2. severe fatigue  3.  Severe thrush: Started on Diflucan yesterday.  I reviewed his blood work and he does not appear to be dehydrated.  Therefore we will continue his liquid oral intake as well as use tube feeds. I will request our navigator to provide him with contact information to join a head and neck cancer support group. Return to clinic in 4 weeks to assess his symptoms.

## 2020-07-01 NOTE — Progress Notes (Signed)
Patient Care Team: Pcp, No as PCP - General Malmfelt, Stephani Police, RN as Oncology Nurse Navigator Eppie Gibson, MD as Attending Physician (Radiation Oncology) Nicholas Lose, MD as Consulting Physician (Hematology and Oncology) Sharen Counter, CCC-SLP as Speech Language Pathologist (Speech Pathology) Wynelle Beckmann, Melodie Bouillon, PT as Physical Therapist (Physical Therapy) Karie Mainland, RD as Dietitian (Nutrition) Karie Mainland, RD as Dietitian (Nutrition) Beverely Pace, LCSW as Social Worker (General Practice)  DIAGNOSIS:    ICD-10-CM   1. 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 CT maxillofacial on 12/22/19 showed postsurgical changes and mandibular invasion, with cervical lymphadenopathy consistent with metastatic nodes. PET scan on 02/16/20 showed the tongue mass with lymph node metastases on the right floor of the mouth and cervical lymph nodes.   03/07/2020 Cancer Staging   Staging form: Oral Cavity, AJCC 8th Edition - Clinical: Stage IVA (cT4a, cN2b, cM0) - Signed by Nicholas Lose, MD on 03/07/2020   04/06/2020 -  Chemotherapy   The patient had dexamethasone (DECADRON) 4 MG tablet, 8 mg, Oral, Daily, 1 of 1 cycle, Start date: 04/26/2020, End date: -- palonosetron (ALOXI) injection 0.25 mg, 0.25 mg, Intravenous,  Once, 3 of 3 cycles Administration: 0.25 mg (04/06/2020), 0.25 mg (04/26/2020), 0.25 mg (05/17/2020) CISplatin (PLATINOL) 165 mg in sodium chloride 0.9 % 500 mL chemo infusion, 100 mg/m2 = 165 mg, Intravenous,  Once, 3 of 3 cycles Dose modification: 75 mg/m2 (original dose 100 mg/m2, Cycle 2, Reason: Provider  Judgment) Administration: 165 mg (04/06/2020), 124 mg (04/26/2020), 124 mg (05/17/2020) fosaprepitant (EMEND) 150 mg in sodium chloride 0.9 % 145 mL IVPB, 150 mg, Intravenous,  Once, 3 of 3 cycles Administration: 150 mg (04/06/2020), 150 mg (04/26/2020), 150 mg (05/17/2020)  for chemotherapy treatment.      CHIEF COMPLIANT: Follow-up of floor of the mouth cancer  INTERVAL HISTORY: Casey [name] is a 54 y.o. with above-mentioned history of floor of the mouthcancer treated with concurrent chemoradiation treatment withevery 3 weekcisplatin. He presents to the clinic todayfor follow-up.  We have noticed significant improvement in his overall health.  There is a lot less soreness.  The tongue appears to be less swollen.  He is taking Reglan and is preventing nausea.  His pain is under much better control.  He still using the fentanyl patch and occasional pain medication as well.  He uses a lidocaine to prevent the burning of the tongue.  He is taking in more oral foods but mostly soft stuff.  ALLERGIES:  has No Known Allergies.  MEDICATIONS:  Current Outpatient Medications  Medication Sig Dispense Refill  . dexamethasone (DECADRON) 1 MG tablet Take 1 tablet (1 mg total) by mouth 2 (two) times daily with a meal. 30 tablet 0  . fentaNYL (DURAGESIC) 25 MCG/HR Place 1 patch onto the skin every 3 (three) days. 10 patch 0  . fluconazole (DIFLUCAN) 100 MG tablet Take 1 tablet (100 mg total) by mouth daily.    Marland Kitchen ibuprofen (ADVIL) 200 MG tablet Take 600 mg by mouth every 6 (six) hours as needed for moderate pain.    Marland Kitchen lidocaine (XYLOCAINE) 2 % solution Patient: Mix 1part  2% viscous lidocaine, 1part H20. Swish/spit 39mL of diluted mixture to numb mouth, q 2hrs PRN. (Patient not taking: Reported on 06/08/2020) 200 mL 4  . metoCLOPramide (REGLAN) 5 MG/5ML solution Take 10 mLs (10 mg total) by mouth 4 (four) times daily -  before meals and at bedtime. May flush into PEG tube if you prefer. 800 mL 4  .  Multiple Vitamin (MULTIVITAMIN) tablet Take 1 tablet by mouth daily.    . Nutritional Supplements (FEEDING SUPPLEMENT, OSMOLITE 1.5 CAL,) LIQD Give 1.5 cartons of Osmolite 1.5 or equivalent via PEG 4 times daily with 60 mL free water before and after bolus feeding. Flush PEG with an additional 240 mL free water 3 times daily. Please send supplies and formula ASAP. 1422 mL 0  . oxyCODONE-acetaminophen (PERCOCET) 5-325 MG tablet Take 1 tablet by mouth every 6 (six) hours as needed for severe pain. 90 tablet 0  . polyethylene glycol (MIRALAX) 17 g packet Try 3 times daily until you have a bowel movement. Reduce to once a day once you have a movement to prevent constipation. 72 each 1  . scopolamine (TRANSDERM-SCOP, 1.5 MG,) 1 MG/3DAYS Place 1 patch (1.5 mg total) onto the skin every 3 (three) days. 10 patch 3   No current facility-administered medications for this visit.    PHYSICAL EXAMINATION: ECOG PERFORMANCE STATUS: 2 - Symptomatic, <50% confined to bed  Vitals:   07/02/20 1023  BP: 121/71  Pulse: 64  Resp: 18  Temp: 98.6 F (37 C)  SpO2: 100%   Filed Weights   07/02/20 1023  Weight: 117 lb 4.8 oz (53.2 kg)    LABORATORY DATA:  I have reviewed the data as listed CMP Latest Ref Rng & Units 06/15/2020 06/08/2020 05/31/2020  Glucose 70 - 99 mg/dL 137(H) 102(H) 197(H)  BUN 6 - 20 mg/dL 16 18 20   Creatinine 0.61 - 1.24 mg/dL 1.16 0.99 1.07  Sodium 135 - 145 mmol/L 136 136 134(L)  Potassium 3.5 - 5.1 mmol/L 4.5 4.7 4.4  Chloride 98 - 111 mmol/L 98 96(L) 99  CO2 22 - 32 mmol/L 30 28 27   Calcium 8.9 - 10.3 mg/dL 9.8 9.4 9.6  Total Protein 6.5 - 8.1 g/dL 7.3 - -  Total Bilirubin 0.3 - 1.2 mg/dL 0.4 - -  Alkaline Phos 38 - 126 U/L 73 - -  AST 15 - 41 U/L 28 - -  ALT 0 - 44 U/L 32 - -    Lab Results  Component Value Date   WBC 3.4 (L) 06/15/2020   HGB 10.6 (L) 06/15/2020   HCT 31.0 (L) 06/15/2020   MCV 95.7 06/15/2020   PLT 275 06/15/2020   NEUTROABS 1.5 (L) 06/15/2020     ASSESSMENT & PLAN:  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  CT maxillofacial on 12/22/19 showed postsurgical changes and mandibular invasion, with cervical lymphadenopathy consistent with metastatic nodes. MRI neck: 12/23/2019: Floor of mouth mass 3.4 cm with involvement of genioglossus muscle and erosion of inner cortex of the right mandible, multiple necrotic level 1B lymph nodes largest 1.6 cm [id number] M0 stage IVa clinical stage  PET scan on 02/16/20 showed the tongue mass with lymph node metastases on the right floor of the mouth and cervical lymph nodes. CK7 and CD 117 are negative, p63 and CK 5/6  are positive, p16 is negative  Treatment plan: 1.Concurrent chemoradiation with cisplatinevery 3 weeks x3 2.PEG tube and port placement 3.Patient will require IV fluids on a weekly basis --------------------------------------------------------------------------------------------------------------------------------------- Current treatment: completed 3 Cycles of cisplatin(started9/10/2019)with XRT Patient will come weekly for IV fluid hydration  Toxicities: 1. Ulceration and swelling of the tongue: Continues to receive Decadron and its helping decrease the swelling of the tongue 2. severe fatigue  3.  Severe thrush: Continue with Diflucan as a thinks that it is preventing his thrush from coming back  Return to clinic in January along with his appointment with nutritionist to review his oral mucosa.    No orders of the defined types were placed in this encounter.  The patient has a good understanding of the overall plan. he agrees with it. he will call with any problems that may develop before the next visit here.  Total time spent: 30 mins  including face to face time and time spent for planning, charting and coordination of care  Nicholas Lose, MD 07/02/2020  I, Cloyde Reams Dorshimer, am acting as scribe for Dr. Nicholas Lose.  I have reviewed the above documentation for accuracy and completeness, and I agree with the above.

## 2020-07-02 ENCOUNTER — Inpatient Hospital Stay: Payer: Medicaid Other | Admitting: Nutrition

## 2020-07-02 ENCOUNTER — Ambulatory Visit (HOSPITAL_COMMUNITY): Payer: Self-pay | Admitting: Dentistry

## 2020-07-02 ENCOUNTER — Telehealth: Payer: Self-pay | Admitting: Hematology and Oncology

## 2020-07-02 ENCOUNTER — Inpatient Hospital Stay (HOSPITAL_BASED_OUTPATIENT_CLINIC_OR_DEPARTMENT_OTHER): Payer: Medicaid Other | Admitting: Hematology and Oncology

## 2020-07-02 ENCOUNTER — Other Ambulatory Visit: Payer: Self-pay

## 2020-07-02 DIAGNOSIS — C049 Malignant neoplasm of floor of mouth, unspecified: Secondary | ICD-10-CM

## 2020-07-02 DIAGNOSIS — Z9221 Personal history of antineoplastic chemotherapy: Secondary | ICD-10-CM | POA: Diagnosis not present

## 2020-07-02 DIAGNOSIS — C029 Malignant neoplasm of tongue, unspecified: Secondary | ICD-10-CM | POA: Diagnosis not present

## 2020-07-02 DIAGNOSIS — Z923 Personal history of irradiation: Secondary | ICD-10-CM | POA: Diagnosis not present

## 2020-07-02 NOTE — Progress Notes (Signed)
DENTAL VISIT: FOLLOW-UP   Date of Appointment:  07/02/2020 Patient Name:   Casey [name] Date of Birth:   03-29-1966 Medical Record Number: [id number]  _______________________________________  COVID 19 SCREENING: The patient does not symptoms concerning for COVID-19 infection (Including fever, chills, cough, or new SHORTNESS OF BREATH).     . Patient presents today for an oral examination after radiation therapy for SCC of the anterior tongue and floor of the mouth. Patient has completed 35 radiation treatments from 04-04-20 to 05-23-20.  Patient has completed 3 out of 3 chemotherapy treatments. . Reviewed medical history with the patient.  No changes reported.  VITALS: BP 113/71 (BP Location: Right Arm)   Pulse 68   Temp 97.8 F (36.6 C)   Wt 117 lb (53.1 kg)   BMI 20.73 kg/m    REVIEW OF CHIEF COMPLAINTS: DRY MOUTH: Yes HARD TO SWALLOW: No HURT TO SWALLOW: No TASTE CHANGES: Taste is slowly returning SORES IN MOUTH: No TRISMUS: Patient denies. WEIGHT: 117 lbs down from 148 lbs  HOME ORAL HYGIENE REGIMEN: BRUSHING: Pt is edentulous FLOSSING: Pt is edentulous RINSING: Not currently using any rinses. FLUORIDE: Pt edentulous. TRISMUS EXERCISES: Maximum interincisal opening: 45 mm   Patient Active Problem List   Diagnosis Date Noted  . 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 Cancer [facility])    Past Surgical History:  Procedure Laterality Date  . IR GASTROSTOMY TUBE MOD SED  04/03/2020  . IR IMAGING GUIDED PORT INSERTION  04/03/2020   Current Outpatient Medications  Medication Sig Dispense Refill  . dexamethasone (DECADRON) 1 MG tablet Take 1 tablet (1 mg total) by mouth 2 (two) times daily with a meal. 30 tablet 0  . fentaNYL (DURAGESIC) 25 MCG/HR Place 1 patch onto the skin every 3 (three) days. 10 patch 0  . fluconazole  (DIFLUCAN) 100 MG tablet Take 1 tablet (100 mg total) by mouth daily.    Marland Kitchen ibuprofen (ADVIL) 200 MG tablet Take 600 mg by mouth every 6 (six) hours as needed for moderate pain.    Marland Kitchen lidocaine (XYLOCAINE) 2 % solution Patient: Mix 1part 2% viscous lidocaine, 1part H20. Swish/spit 29mL of diluted mixture to numb mouth, q 2hrs PRN. (Patient not taking: Reported on 06/08/2020) 200 mL 4  . lidocaine-prilocaine (EMLA) cream Apply to affected area once (Patient not taking: Reported on 03/20/2020) 30 g 3  . LORazepam (ATIVAN) 0.5 MG tablet Take 1 tablet (0.5 mg total) by mouth at bedtime as needed (Nausea or vomiting). (Patient not taking: Reported on 03/20/2020) 30 tablet 1  . metoCLOPramide (REGLAN) 5 MG/5ML solution Take 10 mLs (10 mg total) by mouth 4 (four) times daily -  before meals and at bedtime. May flush into PEG tube if you prefer. 800 mL 4  . Multiple Vitamin (MULTIVITAMIN) tablet Take 1 tablet by mouth daily.    . Nutritional Supplements (FEEDING SUPPLEMENT, OSMOLITE 1.5 CAL,) LIQD Give 1.5 cartons of Osmolite 1.5 or equivalent via PEG 4 times daily with 60 mL free water before and after bolus feeding. Flush PEG with an additional 240 mL free water 3 times daily. Please send supplies and formula ASAP. 1422 mL 0  . ondansetron (ZOFRAN) 8 MG tablet Take 1 tablet (8 mg total) by mouth 2 (two) times daily as needed. Start on the third day after chemotherapy. (Patient not taking: Reported on 03/20/2020) 30  tablet 1  . oxyCODONE-acetaminophen (PERCOCET) 5-325 MG tablet Take 1 tablet by mouth every 6 (six) hours as needed for severe pain. 90 tablet 0  . polyethylene glycol (MIRALAX) 17 g packet Try 3 times daily until you have a bowel movement. Reduce to once a day once you have a movement to prevent constipation. 72 each 1  . prochlorperazine (COMPAZINE) 10 MG tablet Take 1 tablet (10 mg total) by mouth every 6 (six) hours as needed (Nausea or vomiting). (Patient not taking: Reported on 03/20/2020) 30 tablet 1   . scopolamine (TRANSDERM-SCOP, 1.5 MG,) 1 MG/3DAYS Place 1 patch (1.5 mg total) onto the skin every 3 (three) days. 10 patch 3   No current facility-administered medications for this visit.   No Known Allergies   DENTAL EXAM: Oral Hygiene:(PLAQUE): None evident upon examination. LOCATION OF MUCOSITIS: None. DESCRIPTION OF SALIVA: Thick, viscous salivary secretions. ANY EXPOSED BONE: None evident upon clinical examination. OTHER WATCHED AREAS: None.   DIAGNOSES: 1. Xerostomia: Patient reports drinking water frequently throughout the day for dry mouth which is persistent.  Gave him coupon for CLOSYs mouthrinse/topical solution to see if it relieves side effects. 2. Dysgeusia: Pt reports that taste is slowly returning. 3. Mucositis: Pt reports that he did have mouth sores, however they have resolved and he does have lidocaine mouthrinse. 4. Weight Loss: 117 lbs down from 148 lbs.  Since his taste is returning he is hoping to continue to gain weight back.   RECOMMENDATIONS: 1. Brush after meals and at bedtime.  Use fluoride at bedtime. 2. Use trismus exercises as directed. 3. Use CLOSYs or salt water/baking soda rinses. 4. Take multiple sips of water as needed. 5. Return to regular dentist for routine dental care including cleanings and periodic exams.  If patient does not have a regular dentist, establish care at an outside office of his/her choice.  6. Call if any problems or concerns arise.   . All questions/concerns were addressed, and patient verbalized understanding of discussion, findings and recommendations.   . Patient tolerated today's visit well and departed in stable condition.   [place] [place], DMD

## 2020-07-02 NOTE — Patient Instructions (Signed)
[facility] Department of Dental Medicine Dr. Debe Coder B. Benson Norway, DMD Phone: [phone] Fax: [phone]   It was a pleasure seeing you today!  Please refer to the information below regarding your dental visit with [place], and call us should you have any questions or concerns that may come up after you leave.   Thank you for giving [place] the opportunity to provide care for you.  If there is anything we can do for you, please let us know.    RECOMMENDATIONS: 1. Brush after meals and at bedtime.  Floss once a day, and use fluoride at bedtime. 2. Use trismus exercises as directed below. 3. Use CLOSYs for dry mouth, and salt water/baking soda rinses to help with any mouth sores. 4. Take multiple sips of water as needed.  Stay hydrated. 5. Return to your regular dentist for routine dental care including cleanings and periodic exams.  If you do not have a regular dentist, it is important to establish care at an outside dental office of your choice.  6. Call us if any problems or concerns arise.  TRISMUS Trismus is a condition where the jaw does not allow the mouth to open as wide as it usually does.  This can happen almost suddenly, or in other cases the process is so slow, it is hard to notice it-until it is too far along.  When the jaw joints and/or muscles have been exposed to radiation treatments, the onset of Trismus is very slow.  This is because the muscles are losing their stretching ability over a long period of time, as long as 2 YEARS after the end of radiation.  It is therefore important to exercise these muscles and joints.  TRISMUS EXERCISES:  [place] of tongue depressors measuring the same or a little less than the last documented MIO (Maximum Interincisal Opening).  Secure them with a rubber band on both ends.  Place the stack in the patient's mouth, supporting the other end.  Allow 30 seconds for muscle stretching.  Rest for a few seconds.  Repeat 3-5 times.  For all  radiation patients, this exercise is recommended in the mornings and evenings unless otherwise instructed.  The exercise should be done for a period of 2 YEARS after the end of radiation.  Maximum jaw opening should be checked routinely on recall dental visits by your general dentist.  The patient is advised to report any changes, soreness, or difficulties encountered when doing the exercises.

## 2020-07-02 NOTE — Telephone Encounter (Signed)
Scheduled appts per 11/29 los. Pt stated he would refer to mychart for AVS and appt details.

## 2020-07-02 NOTE — Progress Notes (Signed)
Nutrition follow-up completed with patient status post chemoradiation therapy for floor of mouth and anterior tongue cancer on October 28. Weight is stable and was documented as 117.8 pounds today. Patient reports his taste is improving. Reports tolerating mashed potatoes and stuffing with gravy, yams, jellied cranberry sauce, applesauce, ice cream with caramel sauce, and cream soups. He is drinking 2 Carnation breakfast essentials daily. He is using 3 cartons of Osmolite 1.5 via PEG. Reports he plans to begin blenderized foods to increase oral intake.  Estimated nutrition needs: 1800-2100 cal, 90-105 g protein, 1.8 L fluid.  3 cartons Osmolite 1.5 provides 1065 cal, 44.7 g protein, 543 mL free water. 2 cartons Carnation breakfast essentials provides 560 cal, 26 g protein.  Nutrition diagnosis: Inadequate oral intake improved.  Intervention: Educated patient to begin [employer] essentials made with fortified milk twice daily to provide 682 cal, 38 g protein. Provided a sample of nonfat dried milk. Reviewed strategies for adding additional blenderized, high-calorie foods. Continue adequate fluid intake. Questions were answered.  Teach back method used.  Patient provided with additional support to increase intake.  Monitoring, evaluation, goals: Patient will tolerate increased calories and protein to minimize weight loss.  Next visit: Tuesday, January 25 after MD appointment.  **Disclaimer: This note was dictated with voice recognition software. Similar sounding words can inadvertently be transcribed and this note may contain transcription errors which may not have been corrected upon publication of note.**

## 2020-07-04 DIAGNOSIS — Z419 Encounter for procedure for purposes other than remedying health state, unspecified: Secondary | ICD-10-CM | POA: Diagnosis not present

## 2020-07-05 ENCOUNTER — Ambulatory Visit: Payer: Self-pay | Attending: Radiation Oncology

## 2020-07-05 ENCOUNTER — Other Ambulatory Visit: Payer: Self-pay

## 2020-07-05 DIAGNOSIS — R1312 Dysphagia, oropharyngeal phase: Secondary | ICD-10-CM | POA: Insufficient documentation

## 2020-07-05 NOTE — Therapy (Signed)
[facility] [address] [place], [place], [address] Phone: [phone]   Fax:  [phone]  Speech Language Pathology Treatment  Patient Details  Name: Casey [name] Casey [name] MRN: [id number] Date of Birth: 1966/06/07 Referring Provider (SLP): Eppie Gibson, MD   Encounter Date: 07/05/2020   End of Session - 07/05/20 1628    Visit Number 3    Number of Visits 7    Date for SLP Re-Evaluation 07/18/20    SLP Start Time 7782    SLP Stop Time  1448    SLP Time Calculation (min) 33 min    Activity Tolerance Patient tolerated treatment well           Past Medical History:  Diagnosis Date  . Cancer [facility])     Past Surgical History:  Procedure Laterality Date  . IR GASTROSTOMY TUBE MOD SED  04/03/2020  . IR IMAGING GUIDED PORT INSERTION  04/03/2020    There were no vitals filed for this visit.   Subjective Assessment - 07/05/20 1422    Subjective Pt had a lot of variety of things for Thanksgiving dinner - still eliminating bread and pasta.    Currently in Pain? No/denies                 ADULT SLP TREATMENT - 07/05/20 1423      General Information   Behavior/Cognition Alert;Cooperative;Pleasant mood      Treatment Provided   Treatment provided Dysphagia      Dysphagia Treatment   Temperature Spikes Noted No    Respiratory Status Room air    Oral Cavity - Dentition Dentures, not available    Treatment Methods Skilled observation;Therapeutic exercise;Compensation strategy training;Patient/caregiver education    Patient observed directly with PO's Yes    Type of PO's observed Thin liquids;Dysphagia 1 (puree)    Oral Phase Signs & Symptoms --   none noted with applesauce/water   Pharyngeal Phase Signs & Symptoms --   none noted with applesauace/water   Other treatment/comments Pt reports performing HEP "probably once/day", which is better than previous appointment. SLP encouraged pt to BID 3-4 days a  week. SLP reiterated rationale for HEP after pt told SLP the rationale. Pt did infact eat/drink wider variety of POs since last session. Pt is looking forward to obtaiing dentures which fit - at present he is only able to wear a top plate (a temporary, not permanent denture). Bottom temporary no longer fits pt.      Assessment / Recommendations / Plan   Plan Continue with current plan of care      Progression Toward Goals   Progression toward goals Progressing toward goals            SLP Education - 07/05/20 1628    Education Details BID HEP    Person(s) Educated Patient    Methods Explanation    Comprehension Verbalized understanding            SLP Short Term Goals - 07/05/20 1630      SLP SHORT TERM GOAL #1   Title pt will complete HEP with rare min A    Period --   visits, for all STGs   Status Achieved      SLP SHORT TERM GOAL #2   Title pt will tell SLP why pt is completing HEP with modified independence in 2 sessions    Baseline 06-07-20    Time 1    Status On-going  SLP SHORT TERM GOAL #3   Title pt will tell SLP how a food journal could hasten return to a more normalized diet    Status Achieved      SLP SHORT TERM GOAL #4   Title pt will describe 3 overt s/s aspiration PNA with modified independence    Status Achieved            SLP Long Term Goals - 07/05/20 1630      SLP LONG TERM GOAL #1   Title pt will complete HEP with modified independence over two visits    Time 2    Period --   visits, for all LTGs   Status On-going      SLP LONG TERM GOAL #2   Title pt will describe how to modify HEP over time, and the timeline associated with reduction in HEP frequency with modified independence over two sessions    Time 4    Status On-going            Plan - 07/05/20 1628    Clinical Impression Statement At this time pt swallowing is deemed WNL/WFL with water and applesauce - pt could not eat sandwich due to bread, lunch meat and/or cheese stick  due to endenulous/missing bottom plate. SLP reviewed pt's individualized HEP for dysphagia with rare min cues necessary. SLP encouraged pt to try to accomplsh BID HEP for 3-4 days/week. There are no overt s/s aspiration reported by pt at this time. Data indicate that pt's swallow ability will likely decrease over the course of radiation therapy and could very well decline over time following conclusion of their radiation therapy due to muscle disuse atrophy and/or muscle fibrosis. Pt will cont to need to be seen by SLP in order to assess safety of PO intake, assess the need for recommending any objective swallow assessment, and ensuring pt correctly completes the individualized HEP.    Speech Therapy Frequency --   once approx every 4 weeks   Duration --   7 total visits   Treatment/Interventions Aspiration precaution training;Pharyngeal strengthening exercises;Diet toleration management by SLP;Trials of upgraded texture/liquids;Patient/family education;Compensatory strategies;Internal/external aids;SLP instruction and feedback    Potential to Achieve Goals Good    SLP Home Exercise Plan provided today    Consulted and Agree with Plan of Care Patient           Patient will benefit from skilled therapeutic intervention in order to improve the following deficits and impairments:   Dysphagia, oropharyngeal phase    Problem List Patient Active Problem List   Diagnosis Date Noted  . 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 [place], [place], [address] Phone: [phone]   Fax:  [phone]   Name: Casey [name] MRN: [id number] Date of Birth: [birth year]

## 2020-07-09 ENCOUNTER — Other Ambulatory Visit: Payer: Self-pay | Admitting: Hematology and Oncology

## 2020-07-09 MED ORDER — ZOLPIDEM TARTRATE 5 MG PO TABS: 5.0000 mg | ORAL_TABLET | Freq: Every evening | ORAL | 1 refills | Status: DC | PRN

## 2020-07-09 MED FILL — ZOLPIDEM TARTRATE 5 MG TABS: 5 | 30 days supply | Qty: 30 | Fill #0

## 2020-07-10 ENCOUNTER — Encounter: Payer: Self-pay | Admitting: Hematology and Oncology

## 2020-07-10 NOTE — Progress Notes (Signed)
Called patient whom was checking on status of Medicaid app submitted by [employer] patient according to the notes the case was pending caseworker assignment with DSS and he should be contact by phone or mail by them to discuss further. He verbalized understanding and states he would check back in January if he hasn't heard anything.

## 2020-07-19 ENCOUNTER — Telehealth: Payer: Self-pay | Admitting: *Deleted

## 2020-07-19 NOTE — Progress Notes (Signed)
Patient Care Team: Pcp, No as PCP - General Malmfelt, Stephani Police, RN as Oncology Nurse Navigator Eppie Gibson, MD as Attending Physician (Radiation Oncology) Nicholas Lose, MD as Consulting Physician (Hematology and Oncology) Sharen Counter, CCC-SLP as Speech Language Pathologist (Speech Pathology) Wynelle Beckmann, Melodie Bouillon, PT as Physical Therapist (Physical Therapy) Karie Mainland, RD as Dietitian (Nutrition) Karie Mainland, RD as Dietitian (Nutrition) Beverely Pace, LCSW as Social Worker (General Practice)  DIAGNOSIS:    ICD-10-CM   1. 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 CT maxillofacial on 12/22/19 showed postsurgical changes and mandibular invasion, with cervical lymphadenopathy consistent with metastatic nodes. PET scan on 02/16/20 showed the tongue mass with lymph node metastases on the right floor of the mouth and cervical lymph nodes.   03/07/2020 Cancer Staging   Staging form: Oral Cavity, AJCC 8th Edition - Clinical: Stage IVA (cT4a, cN2b, cM0) - Signed by Nicholas Lose, MD on 03/07/2020   04/06/2020 -  Chemotherapy   The patient had dexamethasone (DECADRON) 4 MG tablet, 8 mg, Oral, Daily, 1 of 1 cycle, Start date: 04/26/2020, End date: -- palonosetron (ALOXI) injection 0.25 mg, 0.25 mg, Intravenous,  Once, 3 of 3 cycles Administration: 0.25 mg (04/06/2020), 0.25 mg (04/26/2020), 0.25 mg (05/17/2020) CISplatin (PLATINOL) 165 mg in sodium chloride 0.9 % 500 mL chemo infusion, 100 mg/m2 = 165 mg, Intravenous,  Once, 3 of 3 cycles Dose modification: 75 mg/m2 (original dose 100 mg/m2, Cycle 2, Reason: Provider  Judgment) Administration: 165 mg (04/06/2020), 124 mg (04/26/2020), 124 mg (05/17/2020) fosaprepitant (EMEND) 150 mg in sodium chloride 0.9 % 145 mL IVPB, 150 mg, Intravenous,  Once, 3 of 3 cycles Administration: 150 mg (04/06/2020), 150 mg (04/26/2020), 150 mg (05/17/2020)  for chemotherapy treatment.      CHIEF COMPLIANT: Follow-up of floor of the mouth cancer  INTERVAL HISTORY: Casey [name] is a 54 y.o. with above-mentioned history of floor of the mouthcancer who competed treatment withconcurrent chemoradiation treatment withevery 3 weekcisplatin. He presents to the clinic todayforfollow-up due to recent increase size of the mass on his tongue.  He is also running out of dexamethasone and he got anxious about that.  ALLERGIES:  has No Known Allergies.  MEDICATIONS:  Current Outpatient Medications  Medication Sig Dispense Refill  . dexamethasone (DECADRON) 1 MG tablet Take 1 tablet (1 mg total) by mouth 2 (two) times daily with a meal. 30 tablet 0  . fentaNYL (DURAGESIC) 25 MCG/HR Place 1 patch onto the skin every 3 (three) days. 10 patch 0  . fluconazole (DIFLUCAN) 100 MG tablet Take 1 tablet (100 mg total) by mouth daily.    Marland Kitchen ibuprofen (ADVIL) 200 MG tablet Take 600 mg by mouth every 6 (six) hours as needed for moderate pain.    Marland Kitchen lidocaine (XYLOCAINE) 2 % solution Patient: Mix 1part 2% viscous lidocaine, 1part H20. Swish/spit 20mL of diluted mixture to numb mouth, q 2hrs PRN. (Patient not taking: Reported on 06/08/2020) 200 mL 4  . metoCLOPramide (REGLAN) 5 MG/5ML solution Take 10 mLs (10 mg total) by mouth 4 (four) times daily -  before meals and at bedtime. May flush into  PEG tube if you prefer. 800 mL 4  . Multiple Vitamin (MULTIVITAMIN) tablet Take 1 tablet by mouth daily.    . Nutritional Supplements (FEEDING SUPPLEMENT, OSMOLITE 1.5 CAL,) LIQD Give 1.5 cartons of Osmolite 1.5 or equivalent via PEG 4 times daily with 60 mL free water before and after bolus feeding. Flush  PEG with an additional 240 mL free water 3 times daily. Please send supplies and formula ASAP. 1422 mL 0  . oxyCODONE-acetaminophen (PERCOCET) 5-325 MG tablet Take 1 tablet by mouth every 6 (six) hours as needed for severe pain. 90 tablet 0  . polyethylene glycol (MIRALAX) 17 g packet Try 3 times daily until you have a bowel movement. Reduce to once a day once you have a movement to prevent constipation. 72 each 1  . scopolamine (TRANSDERM-SCOP, 1.5 MG,) 1 MG/3DAYS Place 1 patch (1.5 mg total) onto the skin every 3 (three) days. 10 patch 3  . zolpidem (AMBIEN) 5 MG tablet Take 1 tablet (5 mg total) by mouth at bedtime as needed for sleep. 30 tablet 1   No current facility-administered medications for this visit.    PHYSICAL EXAMINATION: ECOG PERFORMANCE STATUS: 1 - Symptomatic but completely ambulatory Exam: Palpable right cervical lymph node Examination: Moderate swelling of the tongue.  No evidence of thrush. Slight puffiness in the right side of the face.  Vitals:   07/20/20 0916  BP: 138/84  Pulse: 77  Resp: 20  Temp: 97.8 F (36.6 C)  SpO2: 100%   Filed Weights   07/20/20 0916  Weight: 120 lb 4.8 oz (54.6 kg)    LABORATORY DATA:  I have reviewed the data as listed CMP Latest Ref Rng & Units 06/15/2020 06/08/2020 05/31/2020  Glucose 70 - 99 mg/dL 137(H) 102(H) 197(H)  BUN 6 - 20 mg/dL 16 18 20   Creatinine 0.61 - 1.24 mg/dL 1.16 0.99 1.07  Sodium 135 - 145 mmol/L 136 136 134(L)  Potassium 3.5 - 5.1 mmol/L 4.5 4.7 4.4  Chloride 98 - 111 mmol/L 98 96(L) 99  CO2 22 - 32 mmol/L 30 28 27   Calcium 8.9 - 10.3 mg/dL 9.8 9.4 9.6  Total Protein 6.5 - 8.1 g/dL 7.3 - -  Total Bilirubin 0.3 - 1.2 mg/dL 0.4 - -  Alkaline Phos 38 - 126 U/L 73 - -  AST 15 - 41 U/L 28 - -  ALT 0 - 44 U/L 32 - -    Lab Results  Component Value Date   WBC 3.4 (L) 06/15/2020   HGB 10.6 (L) 06/15/2020   HCT 31.0 (L) 06/15/2020   MCV 95.7 06/15/2020   PLT 275 06/15/2020   NEUTROABS 1.5 (L)  06/15/2020    ASSESSMENT & PLAN:  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  CT maxillofacial on 12/22/19 showed postsurgical changes and mandibular invasion, with cervical lymphadenopathy consistent with metastatic nodes. MRI neck: 12/23/2019: Floor of mouth mass 3.4 cm with involvement of genioglossus muscle and erosion of inner cortex of the right mandible, multiple necrotic level 1B lymph nodes largest 1.6 cm T4AN2B M0 stage IVa clinical stage  PET scan on 02/16/20 showed the tongue mass with lymph node metastases on the right floor of the mouth and cervical lymph nodes. CK7 and CD 117 are negative, p63 and CK 5/6 are positive, p16 is negative  Treatment plan: 1.Concurrent chemoradiation with cisplatinevery 3 weeks x3 2.PEG tube and port placement 3.Patient will require IV fluids on a weekly basis --------------------------------------------------------------------------------------------------------------------------------------- Current treatment: completed 3 Cycles of cisplatin(started9/10/2019)with XRT Patient will come weekly for IV fluid hydration Patient came in today because he was worried about worsening tongue swelling.  Toxicities: 1.Ulceration and swelling of the tongue: Continues to receive Decadron and its helping decrease the swelling of the tongue, we reduce the dose of Decadron and he will continue this for probably another month.  I instructed him to start tapering it down. 2.severe fatigue 3.Severe thrush:  As needed Diflucan  Return to clinic in January along with his appointment with nutritionist to review his oral mucosa.     No orders of the defined types were placed in this encounter.  The patient has a good  understanding of the overall plan. he agrees with it. he will call with any problems that may develop before the next visit here.  Total time spent: 30 mins including face to face time and time spent for planning, charting and coordination of care  Nicholas Lose, MD 07/20/2020  I, Cloyde Reams Dorshimer, am acting as scribe for Dr. Nicholas Lose.  I have reviewed the above documentation for accuracy and completeness, and I agree with the above.

## 2020-07-19 NOTE — Telephone Encounter (Signed)
Received call from pt with complaint of increasing size of lump on his tongue.  Pt states recently he has started smoking again and is concerned that may have caused increase in swelling of the tongue not relieved with decadron p.o.  Pt requesting to see MD as soon as possible for further evaluation and treatment.  Apt scheduled and pt verbalized understanding of apt date and time.

## 2020-07-20 ENCOUNTER — Other Ambulatory Visit: Payer: Self-pay | Admitting: Hematology and Oncology

## 2020-07-20 ENCOUNTER — Inpatient Hospital Stay: Payer: Self-pay | Attending: Hematology | Admitting: Hematology and Oncology

## 2020-07-20 ENCOUNTER — Other Ambulatory Visit: Payer: Self-pay

## 2020-07-20 DIAGNOSIS — B370 Candidal stomatitis: Secondary | ICD-10-CM | POA: Insufficient documentation

## 2020-07-20 DIAGNOSIS — R5383 Other fatigue: Secondary | ICD-10-CM | POA: Insufficient documentation

## 2020-07-20 DIAGNOSIS — Z9221 Personal history of antineoplastic chemotherapy: Secondary | ICD-10-CM | POA: Insufficient documentation

## 2020-07-20 DIAGNOSIS — Z791 Long term (current) use of non-steroidal anti-inflammatories (NSAID): Secondary | ICD-10-CM | POA: Insufficient documentation

## 2020-07-20 DIAGNOSIS — Z79899 Other long term (current) drug therapy: Secondary | ICD-10-CM | POA: Insufficient documentation

## 2020-07-20 DIAGNOSIS — C049 Malignant neoplasm of floor of mouth, unspecified: Secondary | ICD-10-CM

## 2020-07-20 DIAGNOSIS — C770 Secondary and unspecified malignant neoplasm of lymph nodes of head, face and neck: Secondary | ICD-10-CM | POA: Insufficient documentation

## 2020-07-20 DIAGNOSIS — C069 Malignant neoplasm of mouth, unspecified: Secondary | ICD-10-CM | POA: Insufficient documentation

## 2020-07-20 DIAGNOSIS — Z7952 Long term (current) use of systemic steroids: Secondary | ICD-10-CM | POA: Insufficient documentation

## 2020-07-20 MED ORDER — DEXAMETHASONE 1 MG PO TABS: 1.0000 mg | ORAL_TABLET | Freq: Every day | ORAL | 1 refills | Status: DC

## 2020-07-20 MED ORDER — NICOTINE 7 MG/24HR TD PT24: 7.0000 mg | MEDICATED_PATCH | Freq: Every day | TRANSDERMAL | 1 refills | Status: DC

## 2020-07-20 MED FILL — NICOTINE 7 MG/24HR PATCH: 7 | 28 days supply | Qty: 28 | Fill #0

## 2020-07-20 MED FILL — DEXAMETHASONE 1 MG TABLET: 1 | 30 days supply | Qty: 30 | Fill #0

## 2020-07-20 NOTE — Progress Notes (Signed)
  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   He developed confluent mucositis.  Plan: The patient will follow-up with radiation oncology in 2-3 weeks.  -----------------------------------  Eppie Gibson, MD

## 2020-07-20 NOTE — Assessment & Plan Note (Signed)
12/21/2019:In March 2021 he underwent dental etxractions and his dentists referred him for evaluation. 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  CT maxillofacial on 12/22/19 showed postsurgical changes and mandibular invasion, with cervical lymphadenopathy consistent with metastatic nodes. MRI neck: 12/23/2019: Floor of mouth mass 3.4 cm with involvement of genioglossus muscle and erosion of inner cortex of the right mandible, multiple necrotic level 1B lymph nodes largest 1.6 cm T4AN2B M0 stage IVa clinical stage  PET scan on 02/16/20 showed the tongue mass with lymph node metastases on the right floor of the mouth and cervical lymph nodes. CK7 and CD 117 are negative, p63 and CK 5/6 are positive, p16 is negative  Treatment plan: 1.Concurrent chemoradiation with cisplatinevery 3 weeks x3 2.PEG tube and port placement 3.Patient will require IV fluids on a weekly basis --------------------------------------------------------------------------------------------------------------------------------------- Current treatment: completed 3 Cycles of cisplatin(started9/10/2019)with XRT Patient will come weekly for IV fluid hydration  Toxicities: 1.Ulceration and swelling of the tongue: Continues to receive Decadron and its helping decrease the swelling of the tongue 2.severe fatigue 3.Severe thrush: Continue with Diflucan as a thinks that it is preventing his thrush from coming back  Return to clinic in January along with his appointment with nutritionist to review his oral mucosa.

## 2020-07-24 ENCOUNTER — Telehealth: Payer: Self-pay | Admitting: Hematology and Oncology

## 2020-07-24 NOTE — Telephone Encounter (Signed)
No 12/17 los, no changes made to pt schedule

## 2020-07-31 ENCOUNTER — Other Ambulatory Visit: Payer: Self-pay | Admitting: *Deleted

## 2020-07-31 MED ORDER — NICOTINE 14 MG/24HR TD PT24: 14.0000 mg | MEDICATED_PATCH | Freq: Every day | TRANSDERMAL | 1 refills | Status: DC

## 2020-07-31 MED FILL — NICOTINE 14 MG/24HR PATCH: 14 | 28 days supply | Qty: 28 | Fill #0

## 2020-07-31 NOTE — Progress Notes (Signed)
Received call from pt stating Nicoderm 7 mg patch is not helping with smoking cessation.  Pt requesting higher dose be called in to pharmacy on file.  Per Wilber Bihari NP pt to start Nicoderm 14 mg patch.  Prescription sent to pharmacy on file and pt verbalized understanding.

## 2020-08-04 DIAGNOSIS — Z419 Encounter for procedure for purposes other than remedying health state, unspecified: Secondary | ICD-10-CM | POA: Diagnosis not present

## 2020-08-06 ENCOUNTER — Ambulatory Visit: Payer: Self-pay | Admitting: Physician Assistant

## 2020-08-07 MED FILL — NICOTINE 14 MG/24HR PATCH: 14 | 28 days supply | Qty: 28 | Fill #0

## 2020-08-14 ENCOUNTER — Ambulatory Visit: Payer: Self-pay | Admitting: Physician Assistant

## 2020-08-15 ENCOUNTER — Telehealth: Payer: Self-pay | Admitting: *Deleted

## 2020-08-15 NOTE — Telephone Encounter (Signed)
CALLED PATIENT TO INFORM OF PET SCAN FOR 08-28-20 - ARRIVAL TIME- 6:30 AM @ [facility], PATIENT TO NOT HAVE ANY CARBS - 12 HRS. PRIOR TO TEST, PATIENT TO HAVE WATER ONLY- 6 HRS. PRIOR TO TEST, PATIENT TO RECEIVE HIS RESULTS FROM DR. SQUIRE ON 08-29-20 @ 3:50 PM, LVM FOR A RETURN CALL

## 2020-08-21 ENCOUNTER — Ambulatory Visit: Payer: Medicaid Other

## 2020-08-22 MED FILL — DEXAMETHASONE 1 MG TABLET: 1 | 30 days supply | Qty: 30 | Fill #1

## 2020-08-23 DIAGNOSIS — C049 Malignant neoplasm of floor of mouth, unspecified: Secondary | ICD-10-CM | POA: Diagnosis not present

## 2020-08-25 MED FILL — NICOTINE 7 MG/24HR PATCH: 7 | 28 days supply | Qty: 28 | Fill #1

## 2020-08-27 NOTE — Progress Notes (Signed)
Patient Care Team: Pcp, No as PCP - General Malmfelt, Stephani Police, RN as Oncology Nurse Navigator Eppie Gibson, MD as Attending Physician (Radiation Oncology) Nicholas Lose, MD as Consulting Physician (Hematology and Oncology) Sharen Counter, CCC-SLP as Speech Language Pathologist (Speech Pathology) Wynelle Beckmann, Melodie Bouillon, PT as Physical Therapist (Physical Therapy) Karie Mainland, RD as Dietitian (Nutrition) Karie Mainland, RD as Dietitian (Nutrition) Beverely Pace, LCSW as Social Worker (General Practice)  DIAGNOSIS:    ICD-10-CM   1. 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 CT maxillofacial on 12/22/19 showed postsurgical changes and mandibular invasion, with cervical lymphadenopathy consistent with metastatic nodes. PET scan on 02/16/20 showed the tongue mass with lymph node metastases on the right floor of the mouth and cervical lymph nodes.   03/07/2020 Cancer Staging   Staging form: Oral Cavity, AJCC 8th Edition - Clinical: Stage IVA (cT4a, cN2b, cM0) - Signed by Nicholas Lose, MD on 03/07/2020   04/06/2020 -  Chemotherapy   The patient had dexamethasone (DECADRON) 4 MG tablet, 8 mg, Oral, Daily, 1 of 1 cycle, Start date: 04/26/2020, End date: -- palonosetron (ALOXI) injection 0.25 mg, 0.25 mg, Intravenous,  Once, 3 of 3 cycles Administration: 0.25 mg (04/06/2020), 0.25 mg (04/26/2020), 0.25 mg (05/17/2020) CISplatin (PLATINOL) 165 mg in sodium chloride 0.9 % 500 mL chemo infusion, 100 mg/m2 = 165 mg, Intravenous,  Once, 3 of 3 cycles Dose modification: 75 mg/m2 (original dose 100 mg/m2, Cycle 2, Reason: Provider  Judgment) Administration: 165 mg (04/06/2020), 124 mg (04/26/2020), 124 mg (05/17/2020) fosaprepitant (EMEND) 150 mg in sodium chloride 0.9 % 145 mL IVPB, 150 mg, Intravenous,  Once, 3 of 3 cycles Administration: 150 mg (04/06/2020), 150 mg (04/26/2020), 150 mg (05/17/2020)  for chemotherapy treatment.      CHIEF COMPLIANT: Follow-up of floor of the mouth cancer  INTERVAL HISTORY: Casey [name] is a 56 y.o. with above-mentioned history of floor of the mouthcancer who completed treatment withconcurrent chemoradiation treatment withevery 3 weekcisplatin. He presents to the clinic todayforfollow-up.  He has gained a significant amount of weight.  He is still on dexamethasone.  He is going to change dexamethasone to every other day.  His taste and appetite has significantly improved.  He has no problems with swallowing.  His tongue appears to be still thickened.  ALLERGIES:  has No Known Allergies.  MEDICATIONS:  Current Outpatient Medications  Medication Sig Dispense Refill  . dexamethasone (DECADRON) 1 MG tablet Take 1 tablet (1 mg total) by mouth every other day. 30 tablet 1  . fluconazole (DIFLUCAN) 100 MG tablet Take 1 tablet (100 mg total) by mouth daily.    Marland Kitchen ibuprofen (ADVIL) 200 MG tablet Take 600 mg by mouth every 6 (six) hours as needed for moderate pain.    . Multiple Vitamin (MULTIVITAMIN) tablet Take 1 tablet by mouth daily.    . nicotine (NICODERM CQ) 7 mg/24hr patch Place 1 patch (7 mg total) onto the skin daily.    Marland Kitchen zolpidem (AMBIEN) 5 MG tablet Take 1 tablet (5 mg total) by mouth at bedtime as needed for sleep. 30 tablet 1   No current facility-administered  medications for this visit.    PHYSICAL EXAMINATION: ECOG PERFORMANCE STATUS: 1 - Symptomatic but completely ambulatory  Vitals:   08/28/20 1411  BP: 96/67  Pulse: 84  Resp: 18  Temp: 98.8 F (37.1 C)  SpO2: 100%   Filed Weights   08/28/20 1411  Weight: 127 lb (57.6 kg)     LABORATORY DATA:  I  have reviewed the data as listed CMP Latest Ref Rng & Units 06/15/2020 06/08/2020 05/31/2020  Glucose 70 - 99 mg/dL 137(H) 102(H) 197(H)  BUN 6 - 20 mg/dL 16 18 20   Creatinine 0.61 - 1.24 mg/dL 1.16 0.99 1.07  Sodium 135 - 145 mmol/L 136 136 134(L)  Potassium 3.5 - 5.1 mmol/L 4.5 4.7 4.4  Chloride 98 - 111 mmol/L 98 96(L) 99  CO2 22 - 32 mmol/L 30 28 27   Calcium 8.9 - 10.3 mg/dL 9.8 9.4 9.6  Total Protein 6.5 - 8.1 g/dL 7.3 - -  Total Bilirubin 0.3 - 1.2 mg/dL 0.4 - -  Alkaline Phos 38 - 126 U/L 73 - -  AST 15 - 41 U/L 28 - -  ALT 0 - 44 U/L 32 - -    Lab Results  Component Value Date   WBC 3.4 (L) 06/15/2020   HGB 10.6 (L) 06/15/2020   HCT 31.0 (L) 06/15/2020   MCV 95.7 06/15/2020   PLT 275 06/15/2020   NEUTROABS 1.5 (L) 06/15/2020    ASSESSMENT & PLAN:  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  CT maxillofacial on 12/22/19 showed postsurgical changes and mandibular invasion, with cervical lymphadenopathy consistent with metastatic nodes. MRI neck: 12/23/2019: Floor of mouth mass 3.4 cm with involvement of genioglossus muscle and erosion of inner cortex of the right mandible, multiple necrotic level 1B lymph nodes largest 1.6 cm T4AN2B M0 stage IVa clinical stage  PET scan on 02/16/20 showed the tongue mass with lymph node metastases on the right floor of the mouth and cervical lymph nodes. CK7 and CD 117 are negative, p63 and CK 5/6 are positive, p16 is negative  Treatment plan: 1.Concurrent chemoradiation with cisplatinevery 3 weeks x3 2.PEG tube and port placement 3.Patient will require IV fluids on a weekly  basis --------------------------------------------------------------------------------------------------------------------------------------- Current treatment:completed 3Cycles ofcisplatin(started9/10/2019)with XRT Patient will come weekly for IV fluid hydration Patient came in today because he was worried about worsening tongue swelling.  Toxicities: 1.Ulceration and swelling of the tongue:I instructed him to decrease Decadron to 3 times a week. 2.severe fatigue: Improvement 3.Severe thrush:  Resolved  PET CT scan 08/28/2020: Increased tongue hypermetabolism SUV 15.2 more diffuse including posteriorly suggestive of progressive local disease.  Resolution of thoracic nodal hypermetabolism.  No evidence of extrathoracic metastases.  I discussed with him the results of the PET CT scan and provided him with a copy of the report.  He is going to see [name] tomorrow.  He understands that [name] will be going to advise the next steps.  Possibility would be to get a biopsy and consider surgical options.  Return to clinic based on Dr. Pearlie Oyster recommendations.    No orders of the defined types were placed in this encounter.  The patient has a good understanding of the overall plan. he agrees with it. he will call with any problems that may develop before the next visit here.  Total time spent: 30 mins including face to face time and time spent for planning, charting and coordination of care  Nicholas Lose, MD 08/28/2020  I, Cloyde Reams Dorshimer, am acting as scribe for Dr. Nicholas Lose.  I have reviewed the above documentation for accuracy and completeness, and I agree with the above.

## 2020-08-28 ENCOUNTER — Ambulatory Visit: Payer: Self-pay | Admitting: Radiation Oncology

## 2020-08-28 ENCOUNTER — Inpatient Hospital Stay: Payer: Medicaid Other

## 2020-08-28 ENCOUNTER — Inpatient Hospital Stay: Payer: Medicaid Other | Attending: Hematology | Admitting: Nutrition

## 2020-08-28 ENCOUNTER — Inpatient Hospital Stay (HOSPITAL_BASED_OUTPATIENT_CLINIC_OR_DEPARTMENT_OTHER): Payer: Medicaid Other | Admitting: Hematology and Oncology

## 2020-08-28 ENCOUNTER — Ambulatory Visit (HOSPITAL_COMMUNITY)
Admission: RE | Admit: 2020-08-28 | Discharge: 2020-08-28 | Disposition: A | Discharge status: Home / Self Care | Payer: Medicaid Other | Source: Ambulatory Visit | Attending: Radiation Oncology | Admitting: Radiation Oncology

## 2020-08-28 ENCOUNTER — Other Ambulatory Visit: Payer: Self-pay

## 2020-08-28 DIAGNOSIS — C770 Secondary and unspecified malignant neoplasm of lymph nodes of head, face and neck: Secondary | ICD-10-CM | POA: Insufficient documentation

## 2020-08-28 DIAGNOSIS — C4492 Squamous cell carcinoma of skin, unspecified: Secondary | ICD-10-CM | POA: Diagnosis not present

## 2020-08-28 DIAGNOSIS — Z23 Encounter for immunization: Secondary | ICD-10-CM | POA: Diagnosis not present

## 2020-08-28 DIAGNOSIS — C049 Malignant neoplasm of floor of mouth, unspecified: Secondary | ICD-10-CM | POA: Diagnosis not present

## 2020-08-28 LAB — GLUCOSE, CAPILLARY: Glucose-Capillary: 89 mg/dL (ref 70–99)

## 2020-08-28 MED ORDER — NICOTINE 7 MG/24HR TD PT24: 7.0000 mg | MEDICATED_PATCH | Freq: Every day | TRANSDERMAL | Status: DC

## 2020-08-28 MED ORDER — FLUDEOXYGLUCOSE F - 18 (FDG) INJECTION
6.3000 | Freq: Once | INTRAVENOUS | Status: AC | PRN
  Administered 2020-08-28: 6.3 via INTRAVENOUS

## 2020-08-28 MED ORDER — DEXAMETHASONE 1 MG PO TABS: 1.0000 mg | ORAL_TABLET | ORAL | 1 refills | Status: DC

## 2020-08-28 NOTE — Progress Notes (Signed)
Nutrition follow-up completed with patient status post chemoradiation therapy for floor of mouth and anterior tongue cancer. Weight improved and documented as 127 pounds on January 25. This is increased from 120.3 pounds December 17. Patient reports he no longer uses his feeding tube for nutrition. He is flushing his tube every day with water and denies problems. Reports he is eating normally but avoids spicy and acidic foods secondary to poor tolerance. He has not used feeding tube for nutrition for approximately 6 weeks. Noted PET scan has been completed and patient has a follow-up with [name] tomorrow to discuss. Await MD evaluation and potential need for tube usage in the future before decision made to remove feeding tube.

## 2020-08-28 NOTE — Assessment & Plan Note (Addendum)
12/21/2019:In March 2021 he underwent dental etxractions and his dentists referred him for evaluation. 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  CT maxillofacial on 12/22/19 showed postsurgical changes and mandibular invasion, with cervical lymphadenopathy consistent with metastatic nodes. MRI neck: 12/23/2019: Floor of mouth mass 3.4 cm with involvement of genioglossus muscle and erosion of inner cortex of the right mandible, multiple necrotic level 1B lymph nodes largest 1.6 cm T4AN2B M0 stage IVa clinical stage  PET scan on 02/16/20 showed the tongue mass with lymph node metastases on the right floor of the mouth and cervical lymph nodes. CK7 and CD 117 are negative, p63 and CK 5/6 are positive, p16 is negative  Treatment plan: 1.Concurrent chemoradiation with cisplatinevery 3 weeks x3 2.PEG tube and port placement 3.Patient will require IV fluids on a weekly basis --------------------------------------------------------------------------------------------------------------------------------------- Current treatment:completed 3Cycles ofcisplatin(started9/10/2019)with XRT Patient will come weekly for IV fluid hydration Patient came in today because he was worried about worsening tongue swelling.  Toxicities: 1.Ulceration and swelling of the tongue:I instructed him to decrease Decadron to 3 times a week. 2.severe fatigue: Improvement 3.Severe thrush:  Resolved  PET CT scan 08/28/2020: Increased tongue hypermetabolism SUV 15.2 more diffuse including posteriorly suggestive of progressive local disease.  Resolution of thoracic nodal hypermetabolism.  No evidence of extrathoracic metastases.  I discussed with him the results of the PET CT scan and provided him with a copy of the report.  He is going to see [name] tomorrow.  He understands that [name] will be going to advise the next steps.   Possibility would be to get a biopsy and consider surgical options.  Return to clinic based on Dr. Pearlie Oyster recommendations.

## 2020-08-29 ENCOUNTER — Ambulatory Visit: Payer: Medicaid Other | Attending: Radiation Oncology

## 2020-08-29 ENCOUNTER — Other Ambulatory Visit: Payer: Self-pay

## 2020-08-29 ENCOUNTER — Telehealth: Payer: Self-pay | Admitting: Hematology and Oncology

## 2020-08-29 ENCOUNTER — Ambulatory Visit
Admission: RE | Admit: 2020-08-29 | Discharge: 2020-08-29 | Disposition: A | Discharge status: Home / Self Care | Payer: Medicaid Other | Source: Ambulatory Visit | Attending: Radiation Oncology | Admitting: Radiation Oncology

## 2020-08-29 VITALS — BP 117/79 | HR 75 | Temp 97.2°F | Resp 18 | Ht 63.0 in | Wt 124.1 lb

## 2020-08-29 DIAGNOSIS — Z79899 Other long term (current) drug therapy: Secondary | ICD-10-CM | POA: Diagnosis not present

## 2020-08-29 DIAGNOSIS — I2510 Atherosclerotic heart disease of native coronary artery without angina pectoris: Secondary | ICD-10-CM | POA: Insufficient documentation

## 2020-08-29 DIAGNOSIS — J439 Emphysema, unspecified: Secondary | ICD-10-CM | POA: Diagnosis not present

## 2020-08-29 DIAGNOSIS — C028 Malignant neoplasm of overlapping sites of tongue: Secondary | ICD-10-CM | POA: Diagnosis not present

## 2020-08-29 DIAGNOSIS — R1310 Dysphagia, unspecified: Secondary | ICD-10-CM | POA: Diagnosis not present

## 2020-08-29 DIAGNOSIS — C049 Malignant neoplasm of floor of mouth, unspecified: Secondary | ICD-10-CM

## 2020-08-29 DIAGNOSIS — I6523 Occlusion and stenosis of bilateral carotid arteries: Secondary | ICD-10-CM | POA: Insufficient documentation

## 2020-08-29 DIAGNOSIS — R1312 Dysphagia, oropharyngeal phase: Secondary | ICD-10-CM | POA: Diagnosis not present

## 2020-08-29 DIAGNOSIS — Z08 Encounter for follow-up examination after completed treatment for malignant neoplasm: Secondary | ICD-10-CM | POA: Diagnosis not present

## 2020-08-29 DIAGNOSIS — C023 Malignant neoplasm of anterior two-thirds of tongue, part unspecified: Secondary | ICD-10-CM | POA: Diagnosis not present

## 2020-08-29 NOTE — Therapy (Signed)
[facility] [address] [place], [place], [address] Phone: [phone]   Fax:  [phone]  Speech Language Pathology Treatment  Patient Details  Name: Casey [name] MRN: [id number] Date of Birth: [birth year] Referring Provider (SLP): Eppie Gibson, MD   Encounter Date: 08/29/2020   End of Session - 08/29/20 1335    Visit Number 4    Number of Visits 7    Date for SLP Re-Evaluation 11/27/20    SLP Start Time 1104    SLP Stop Time  1134    SLP Time Calculation (min) 30 min    Activity Tolerance Patient tolerated treatment well           Past Medical History:  Diagnosis Date  . Cancer [facility])     Past Surgical History:  Procedure Laterality Date  . IR GASTROSTOMY TUBE MOD SED  04/03/2020  . IR IMAGING GUIDED PORT INSERTION  04/03/2020    There were no vitals filed for this visit.   Subjective Assessment - 08/29/20 1108    Subjective Pt with a "spot on my tongue" that is suspicious on PET - consult with [name] today to discuss treatment.    Currently in Pain? No/denies                 ADULT SLP TREATMENT - 08/29/20 1109      General Information   Behavior/Cognition Alert;Cooperative;Pleasant mood      Treatment Provided   Treatment provided Dysphagia      Dysphagia Treatment   Temperature Spikes Noted No    Respiratory Status Room air    Oral Cavity - Dentition Dentures, not available    Treatment Methods Skilled observation;Therapeutic exercise;Compensation strategy training;Patient/caregiver education    Patient observed directly with PO's Yes    Type of PO's observed Regular;Thin liquids    Oral Phase Signs & Symptoms Prolonged mastication   pt without dentures to eat POs   Pharyngeal Phase Signs & Symptoms --   none noted   Other treatment/comments Lots of pasta,croissant, biscuits, hamburger, spaghetti, brownies. Pt's variety of food has increased since last visit. He  performed HEP with modified independence. He will meet with radiation oncologist this afternoon and may meet with ENT to discuss treatment planning. If surgery or more radiation is planned pt may require additional exercise/s. ST to see pt next month after treatment planning is completed.      Assessment / Recommendations / Plan   Plan Continue with current plan of care   possible add more exercise - TBD after treatment planning complete     Progression Toward Goals   Progression toward goals Progressing toward goals              SLP Short Term Goals - 08/29/20 1338      SLP SHORT TERM GOAL #1   Title pt will complete HEP with rare min A    Period --   visits, for all STGs   Status Achieved      SLP SHORT TERM GOAL #2   Title pt will tell SLP why pt is completing HEP with modified independence in 2 sessions    Baseline 06-07-20, 08-29-20    Status Achieved      SLP SHORT TERM GOAL #3   Title pt will tell SLP how a food journal could hasten return to a more normalized diet    Status Achieved      SLP SHORT TERM GOAL #4  Title pt will describe 3 overt s/s aspiration PNA with modified independence    Status Achieved            SLP Long Term Goals - 08/29/20 1339      SLP LONG TERM GOAL #1   Title pt will complete HEP with modified independence over two visits    Baseline 08-29-20    Time 1    Period --   visits, for all LTGs   Status On-going      SLP LONG TERM GOAL #2   Title pt will describe how to modify HEP over time, and the timeline associated with reduction in HEP frequency with modified independence over two sessions    Time 3    Status On-going            Plan - 08/29/20 1336    Clinical Impression Statement At this time pt swallowing is deemed WNL/WFL with water and peanut butter crackers - pt reports he will meet with rad onc this afternoon to discuss treatment planning for possible cancer just posterior to previous spot on lateral lingual surface.SLP  reviewed pt's individualized HEP for dysphagia with rare min cues necessary. SLP encouraged pt to accomplsh BID HEP.. There are no overt s/s aspiration reported by pt at this time. Data indicate that pt's swallow ability could very well decline over time following conclusion of this latest chemo-radiation therapy due to muscle disuse atrophy and/or muscle fibrosis. Pt will cont to need to be seen by SLP in order to assess safety of PO intake, assess the need for recommending any objective swallow assessment, and ensuring pt correctly completes the individualized HEP.    Speech Therapy Frequency --   once approx every 4 weeks   Duration --   7 total visits   Treatment/Interventions Aspiration precaution training;Pharyngeal strengthening exercises;Diet toleration management by SLP;Trials of upgraded texture/liquids;Patient/family education;Compensatory strategies;Internal/external aids;SLP instruction and feedback    Potential to Achieve Goals Good    SLP Home Exercise Plan provided today    Consulted and Agree with Plan of Care Patient           Patient will benefit from skilled therapeutic intervention in order to improve the following deficits and impairments:   Dysphagia, oropharyngeal phase - Plan: SLP plan of care cert/re-cert    Problem List Patient Active Problem List   Diagnosis Date Noted  . 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 [place], [place], [address] Phone: [phone]   Fax:  [phone]   Name: Casey [name] MRN: [id number] Date of Birth: 1965-08-27

## 2020-08-29 NOTE — Patient Instructions (Signed)
  SWALLOWING EXERCISES Do these until 6 months after your last day of radiation, then 2 times per week afterwards  1. Effortful Swallows - Press your tongue against the roof of your mouth for 3 seconds, then squeeze the muscles in your neck while you swallow your saliva or a sip of water - Repeat 10-15 times, 2-3 times a day, and use whenever you eat or drink  2. Pitch Raise - Repeat "he", once per second in as high of a pitch as you can - Repeat 20 times, 2-3 times a day  3. Shaker Exercise - head lift - Lie flat on your back in your bed or on a couch without pillows - Raise your head and look at your feet - KEEP YOUR SHOULDERS DOWN - HOLD FOR 45-60 SECONDS, then lower your head back down - Repeat 3 times, 2-3 times a day  4. Mendelsohn Maneuver - "half swallow" exercise - Start to swallow, and keep your Adam's apple up by squeezing hard with the muscles of the throat - Hold the squeeze for 5-7 seconds and then relax - Repeat 10-15 times, 2-3 times a day *use a wet spoon if your mouth gets dry*  5. Chin pushback - Open your mouth  - Place your fist UNDER your chin near your neck - Tuck your chin and push back with your fist for 5 seconds - Repeat 10 times, 2-3 times a day       6. "Super Swallow"  - Take a breath and hold it  - Bear down (like pushing your bowels)  - Swallow then IMMEDIATELY cough  - Repeat 10 times, 2-3 times a day

## 2020-08-29 NOTE — Telephone Encounter (Signed)
No 1/25 los. No changes made to pt's schedule.  

## 2020-08-29 NOTE — Progress Notes (Signed)
[name] presents today for follow-up after completing radiation to his tongue/floor of mouth on 05/23/2020, and to review PET scan results from 08/28/2020  Pain issues, if any: Patient denies Using a feeding tube?:  Has not used for feedings since 07/2020. Just flushes daily. 08/28/2020 Casey [name]:"Patient reports he no longer uses his feeding tube for nutrition. He is flushing his tube every day with water and denies problems. Reports he is eating normally but avoids spicy and acidic foods secondary to poor tolerance. He has not used feeding tube for nutrition for approximately 6 weeks. Noted PET scan has been completed and patient has a follow-up with [name] tomorrow to discuss. Await MD evaluation and potential need for tube usage in the future before decision made to remove feeding tube." Weight changes, if any:  Wt Readings from Last 3 Encounters:  08/29/20 124 lb 2 oz (56.3 kg)  08/28/20 127 lb (57.6 kg)  07/20/20 120 lb 4.8 oz (54.6 kg)   Swallowing issues, if any: Overall denies any concerns. There are still certain types of food he avoids, but on the whole he feels he can eat a wide variety of foods (soft or with gravy/sauce) without any issues. 08/29/2020 Saw [name]: "At this time pt swallowing is deemed WNL/WFL with water and peanut butter crackers - pt reports he will meet with rad onc this afternoon to discuss treatment planning for possible cancer just posterior to previous spot on lateral lingual surface.SLP reviewed pt's individualized HEP for dysphagia with rare min cues necessary. SLP encouraged pt to accomplsh BID HEP. There are no overt s/s aspiration reported by pt at this time."  Smoking or chewing tobacco? None Using fluoride trays daily? N/A--Saw [name] on 07/02/2020: RECOMMENDATIONS: 1. Brush after meals and at bedtime.  Use fluoride at bedtime. 2. Use trismus exercises as directed. 3. Use CLOSYs or salt water/baking soda rinses. 4. Take  multiple sips of water as needed. 5. Return to regular dentist for routine dental care including cleanings and periodic exams.  If patient does not have a regular dentist, establish care at an outside office of his/her choice.  6. Call if any problems or concerns arise"  Last ENT visit was on: 08/23/2020 Saw [name]: "--Physical Exam:  Overall healthy-appearing in no distress. Breathing is clear. Voice is a little bit affected by the tethered tongue. There is bilateral level 1 residual lymph nodes that are mobile and less than 2 cm. The right side is a little bit larger than the left. Otherwise there is diffuse radiation fibrosis. Oral cavity and pharynx reveals fullness of the left oral tongue with tethering of the tongue to the mandible from the left ramus all the way around the anterior arch. There is no obvious palpable hard tumor or ulceration. --Impression & Plans:  Stable posttreatment. No obvious persistent disease except for the lymph nodes bilaterally. He has a PET scan scheduled for next week. We will discuss findings following that. Otherwise plan to see him back in 3 months."  Other notable issues, if any: Reports slight increase in swelling in under jaw. Marble size lump under right side of mandible remains unchanged. Reports resolition of thick saliva, but still deals with dry mouth and altered sense of taste. Denies any ear of jaw pain, or toruble opening his mouth. Denies any issues sleeping (though has Ambien if needed). Overall, he reports he is pleased with his recovery thus far  Today's Vitals   08/29/20 1543  BP: 117/79  Pulse: 75  Resp: 18  Temp: (!) 97.2 F (36.2 C)  TempSrc: Temporal  SpO2: 100%  Weight: 124 lb 2 oz (56.3 kg)  Height: 5\' 3"  (1.6 m)

## 2020-08-31 NOTE — Progress Notes (Addendum)
Radiation Oncology         [phone]) [phone] ________________________________  Name: Casey [name] MRN: [id number]  Date: 08/29/2020  DOB: 10-17-1965  Follow-Up Visit Note  CC: Pcp, No  Casey [name]  Diagnosis and Prior Radiotherapy:       ICD-10-CM   1. 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 presents today for follow-up after completing radiation to his tongue/floor of mouth on 05/23/2020, and to review PET scan results from 08/28/2020  Pain issues, if any: Patient denies Using a feeding tube?:  Has not used for feedings since 07/2020. Just flushes daily. 08/28/2020 [name]:"Patient reports he no longer uses his feeding tube for nutrition. He is flushing his tube every day with water and denies problems. Reports he is eating normally but avoids spicy and acidic foods secondary to poor tolerance. He has not used feeding tube for nutrition for approximately 6 weeks. Noted PET scan has been completed and patient has a follow-up with [name] tomorrow to discuss. Await [name] evaluation and potential need for tube usage in the future before decision made to remove feeding tube." Weight changes, if any:  Wt Readings from Last 3 Encounters:  08/29/20 124 lb 2 oz (56.3 kg)  08/28/20 127 lb (57.6 kg)  07/20/20 120 lb 4.8 oz (54.6 kg)   Swallowing issues, if any: Overall denies any concerns. There are still certain types of food he avoids, but on the whole he feels he can eat a wide variety of foods (soft or with gravy/sauce) without any issues. 08/29/2020 Saw [name]: "At this time pt swallowing  is deemed WNL/WFL with water and peanut butter crackers - pt reports he will meet with rad onc this afternoon to discuss treatment planning for possible cancer just posterior to previous spot on lateral lingual surface.SLP reviewed pt's individualized HEP for dysphagia with rare min cues necessary. SLP encouraged pt to accomplsh BID HEP. There are no overt s/s aspiration reported by pt at this time."  Smoking or chewing tobacco? None  Last ENT visit was on: 08/23/2020 Saw [name]: "--Physical Exam:  Overall healthy-appearing in no distress. Breathing is clear. Voice is a little bit affected by the tethered tongue. There is bilateral level 1 residual lymph nodes that are mobile and less than 2 cm. The right side is a little bit larger than the left. Otherwise there is diffuse radiation fibrosis. Oral cavity and pharynx reveals fullness of the left oral tongue with tethering of the tongue to the mandible from the left ramus all the way around the anterior arch. There is no obvious palpable hard tumor or ulceration. --Impression & Plans:  Stable posttreatment. No obvious persistent disease except for the lymph nodes bilaterally. He has a PET scan scheduled for next week. We will discuss findings following that. Otherwise plan to see him back in 3 months."  Other notable issues, if any: Reports slight increase in swelling in under jaw. Marble size lump under right side of mandible remains unchanged. Reports resolition of thick saliva, but still deals with dry mouth and altered sense of taste.  Denies any ear of jaw pain, or toruble opening his mouth. Denies any issues sleeping (though has Ambien if needed). Overall, he reports he is pleased with his recovery thus far  Today's Vitals   08/29/20 1543  BP: 117/79  Pulse: 75  Resp: 18  Temp: (!) 97.2 F (36.2 C)  TempSrc: Temporal  SpO2: 100%  Weight: 124 lb 2 oz (56.3 kg)  Height: _0  (1.6 m)  PainSc: 0-No pain     Vitals:   08/29/20 1543   BP: 117/79  Pulse: 75  Resp: 18  Temp: (!) 97.2 F (36.2 C)  SpO2: 100%                       ALLERGIES:  has No Known Allergies.  Meds: Current Outpatient Medications  Medication Sig Dispense Refill  . dexamethasone (DECADRON) 1 MG tablet Take 1 tablet (1 mg total) by mouth every other day. 30 tablet 1  . fluconazole (DIFLUCAN) 100 MG tablet Take 1 tablet (100 mg total) by mouth daily.    Marland Kitchen ibuprofen (ADVIL) 200 MG tablet Take 600 mg by mouth every 6 (six) hours as needed for moderate pain.    . Multiple Vitamin (MULTIVITAMIN) tablet Take 1 tablet by mouth daily.    . nicotine (NICODERM CQ) 7 mg/24hr patch Place 1 patch (7 mg total) onto the skin daily.    Marland Kitchen zolpidem (AMBIEN) 5 MG tablet Take 1 tablet (5 mg total) by mouth at bedtime as needed for sleep. 30 tablet 1   No current facility-administered medications for this encounter.    Physical Findings: The patient is in no acute distress. Patient is alert and oriented. Wt Readings from Last 3 Encounters:  08/29/20 124 lb 2 oz (56.3 kg)  08/28/20 127 lb (57.6 kg)  07/20/20 120 lb 4.8 oz (54.6 kg)    height is _1  (1.6 m) and weight is 124 lb 2 oz (56.3 kg). His temporal temperature is 97.2 F (36.2 C) (abnormal). His blood pressure is 117/79 and his pulse is 75. His respiration is 18 and oxygen saturation is 100%. .  General: Alert and oriented, in no acute distress HEENT: Head is normocephalic. Extraocular movements are intact. Oropharynx is notable for no lesions in the upper throat.   No mucosal lesions appreciated on the tongue, buccal mucosa, or floor of mouth.    Skin: Skin in treatment fields shows satisfactory healing   Neck: He continues to have a mildly enlarged lymph node at the right submandibular region  Lab Findings: Lab Results  Component Value Date   WBC 3.4 (L) 06/15/2020   HGB 10.6 (L) 06/15/2020   HCT 31.0 (L) 06/15/2020   MCV 95.7 06/15/2020   PLT 275 06/15/2020    Lab Results  Component Value  Date   TSH 0.973 03/26/2020   CMP     Component Value Date/Time   NA 136 06/15/2020 0927   K 4.5 06/15/2020 0927   CL 98 06/15/2020 0927   CO2 30 06/15/2020 0927   GLUCOSE 137 (H) 06/15/2020 0927   BUN 16 06/15/2020 0927   CREATININE 1.16 06/15/2020 0927   CALCIUM 9.8 06/15/2020 0927   PROT 7.3 06/15/2020 0927   ALBUMIN 3.8 06/15/2020 0927   AST 28 06/15/2020 0927   ALT 32 06/15/2020 0927   ALKPHOS 73 06/15/2020 0927   BILITOT 0.4 06/15/2020 0927   GFRNONAA >60 06/15/2020 0927   GFRAA >60 05/03/2020 0940    Radiographic Findings:  NM PET Image Restag (PS) Skull Base To Thigh  Result Date: 08/28/2020 CLINICAL DATA:  Subsequent treatment strategy for restaging of squamous cell carcinoma of the floor of mouth. Second COVID vaccine in left arm in September. EXAM: NUCLEAR MEDICINE PET SKULL BASE TO THIGH TECHNIQUE: 6.3 mCi F-18 FDG was injected intravenously. Full-ring PET imaging was performed from the skull base to thigh after the radiotracer. CT data was obtained and used for attenuation correction and anatomic localization. Fasting blood glucose: 89 mg/dl COMPARISON:  Comparison outside PET of 12/14/2019 FINDINGS: Mediastinal blood pool activity: SUV max 2.2 Liver activity: SUV max NA NECK: Hypermetabolism involving the tongue appears more diffuse, including posteriorly today. Example at a S.U.V. max of 15.2 versus a S.U.V. max of 7.2 on the prior exam. No residual cervical nodal hypermetabolism. Incidental CT findings: Calcified right floor mild/submandibular node measures 8 mm on 36/4 versus 1.0 cm on the prior exam (when remeasured). No new cervical adenopathy. Bilateral carotid atherosclerosis. Maxillary sinus mucosal thickening. CHEST: No pulmonary parenchymal or thoracic nodal hypermetabolism. Incidental CT findings: Right Port-A-Cath tip high right atrium. Aortic and coronary artery atherosclerosis. Centrilobular emphysema. Left upper lobe calcified granuloma. ABDOMEN/PELVIS: No  abdominopelvic parenchymal or nodal hypermetabolism. Incidental CT findings: Normal adrenal glands. Abdominal aortic atherosclerosis. Gastrostomy tube. SKELETON: No areas of abnormal hypermetabolism. Incidental CT findings: No acute osseous abnormality. IMPRESSION: 1. Increased tongue hypermetabolism, now appearing more diffuse, including posteriorly. This suggests progressive local disease. 2. Resolution of thoracic nodal hypermetabolism. 3. No evidence of extrathoracic metastasis. 4. Aortic atherosclerosis (ICD10-I70.0), coronary artery atherosclerosis and emphysema (ICD10-J43.9). Electronically Signed   By: Abigail Miyamoto M.D.   On: 08/28/2020 10:38    Impression/Plan:    1) Head and Neck Cancer Status: He has healed well from definitive chemoradiation.  I personally reviewed his images.  Unfortunately, they are suspicious for progressive disease in the tongue.  The enlarged lymph node at the right submandibular region is still palpable but on PET scan it appears to be calcified without any significant hypermetabolic activity.  He does not demonstrate any active hypermetabolic activity in the rest of the neck nodes and no evidence of distant metastatic disease either.  Unfortunately, the posterior two thirds of the tongue show significant hypermetabolic activity, suspicious for progressive tumor.  Interestingly, the anterior portion of his tongue and floor of mouth, where the tumor originated, show a significant reduction in hypermetabolic activity.  We spoke about the fact that imaging is not definitive for cancer and that we need to obtain tissue to figure out if this process is malignant versus benign.  The patient will be seen in the near future by Dr. [name].  I communicated with him personally about the patient's PET results.  He will likely undergo biopsy.  The patient will also establish care with our new head and neck medical oncologist, Dr. Chryl Heck (I have conferred with her about this, and she and  Dr. Lindi Adie are agreeable to this plan).  He understands that if he has progressive disease, his 2 options may include aggressive surgical resection versus palliative systemic therapy.  PD-L1 testing will be conducted if there is residual tumor to evaluate his candidacy for immunotherapy.  2) Nutritional Status: Keep PEG for now as a backup form of nutritional support.  His nutritional intake is stabilizing and he is able to take all food by mouth Wt Readings from Last 3 Encounters:  08/29/20 124 lb 2 oz (56.3 kg)  08/28/20 127 lb (57.6 kg)  07/20/20 120 lb  4.8 oz (54.6 kg)    3) Risk Factors: The patient has been educated about risk factors including alcohol and tobacco abuse; they understand that avoidance of alcohol and tobacco is important to prevent recurrences as well as other cancers  4) Swallowing: Continue speech-language pathology exercises  5) Thyroid function: Check annually in medical oncology Lab Results  Component Value Date   TSH 0.973 03/26/2020   6) Other: Follow-up with me in 6 months.  On date of service, in total, I spent 35 minutes on this encounter. Patient was seen in person. _____________________________________   Eppie Gibson, [name]

## 2020-09-04 DIAGNOSIS — C049 Malignant neoplasm of floor of mouth, unspecified: Secondary | ICD-10-CM | POA: Diagnosis not present

## 2020-09-04 DIAGNOSIS — Z419 Encounter for procedure for purposes other than remedying health state, unspecified: Secondary | ICD-10-CM | POA: Diagnosis not present

## 2020-09-04 DIAGNOSIS — K140 Glossitis: Secondary | ICD-10-CM | POA: Diagnosis not present

## 2020-09-04 DIAGNOSIS — K148 Other diseases of tongue: Secondary | ICD-10-CM | POA: Diagnosis not present

## 2020-09-10 ENCOUNTER — Other Ambulatory Visit: Payer: Self-pay

## 2020-09-10 ENCOUNTER — Encounter (HOSPITAL_BASED_OUTPATIENT_CLINIC_OR_DEPARTMENT_OTHER): Payer: Self-pay | Admitting: Otolaryngology

## 2020-09-13 ENCOUNTER — Other Ambulatory Visit (HOSPITAL_COMMUNITY)
Admission: RE | Admit: 2020-09-13 | Discharge: 2020-09-13 | Disposition: A | Discharge status: Home / Self Care | Payer: Medicaid Other | Source: Ambulatory Visit | Attending: Otolaryngology | Admitting: Otolaryngology

## 2020-09-13 DIAGNOSIS — Z01812 Encounter for preprocedural laboratory examination: Secondary | ICD-10-CM | POA: Diagnosis not present

## 2020-09-13 DIAGNOSIS — Z20822 Contact with and (suspected) exposure to covid-19: Secondary | ICD-10-CM | POA: Diagnosis not present

## 2020-09-13 LAB — SARS CORONAVIRUS 2 (TAT 6-24 HRS): SARS Coronavirus 2: NEGATIVE

## 2020-09-16 NOTE — H&P (Signed)
HPI:   Casey [name] is a 55 y.o. male who presents as a consult Patient.   Referring Provider: Peggye Pitt, PA-C  Chief complaint: Follow-up oral cancer.  HPI: Patient was diagnosed and worked up in [place] and refused surgical intervention, transferred up here with his family and underwent chemo/XRT. He completed this a couple of weeks ago. He still has a feeding tube. He was referred here today because of an irregular-looking area on the oral tongue which has since mostly resolved after being treated for candidiasis.  PMH/Meds/All/SocHx/FamHx/ROS:   Past Medical History:  Diagnosis Date  . Cancer [facility])   Past Surgical History:  Procedure Laterality Date  . NO PAST SURGERIES   No family history of bleeding disorders, wound healing problems or difficulty with anesthesia.   Social History   Socioeconomic History  . Marital status: Unknown  Spouse name: Not on file  . Number of children: Not on file  . Years of education: Not on file  . Highest education level: Not on file  Occupational History  . Not on file  Tobacco Use  . Smoking status: Former Smoker  Quit date: 2021  Years since quitting: 0.8  . Smokeless tobacco: Never Used  Vaping Use  . Vaping Use: Never used  Substance and Sexual Activity  . Alcohol use: Not on file  . Drug use: Not on file  . Sexual activity: Not on file  Other Topics Concern  . Not on file  Social History Narrative  . Not on file   Social Determinants of Health   Financial Resource Strain:  . Difficulty of Paying Living Expenses: Not on file  Food Insecurity:  . Worried About Charity fundraiser in the Last Year: Not on file  . Ran Out of Food in the Last Year: Not on file  Transportation Needs:  . Lack of Transportation (Medical): Not on file  . Lack of Transportation (Non-Medical): Not on file  Physical Activity:  . Days of Exercise per Week: Not on file  . Minutes of Exercise per Session: Not on file  Stress:  .  Feeling of Stress : Not on file  Social Connections:  . Frequency of Communication with Friends and Family: Not on file  . Frequency of Social Gatherings with Friends and Family: Not on file  . Attends Religious Services: Not on file  . Active Member of Clubs or Organizations: Not on file  . Attends Archivist Meetings: Not on file  . Marital Status: Not on file   Current Outpatient Medications:  . dexAMETHasone (DECADRON) 1 MG tablet, Take 1 mg by mouth., Disp: , Rfl:  . fentaNYL (DURAGESIC) 25 mcg/hr patch, PLACE 1 PATCH ONTO THE SKIN EVERY 3 DAYS, Disp: , Rfl:  . fluconazole (DIFLUCAN) 100 MG tablet, , Disp: , Rfl:  . LORazepam (ATIVAN) 0.5 MG tablet, Take 0.5 mg by mouth., Disp: , Rfl:  . metoclopramide HCl (REGLAN) 5 mg/5 mL solution, Take 10 mg by mouth., Disp: , Rfl:  . multivitamin (MULTIVITAMIN) tablet, Take 1 tablet by mouth daily., Disp: , Rfl:  . ondansetron (ZOFRAN) 8 MG tablet, Take 8 mg by mouth., Disp: , Rfl:  . Osmolite 1.5 (OSMOLITE 1.5 CAL) 0.06 gram-1.5 kcal/mL Liqd, Give 1.5 cartons of Osmolite 1.5 or equivalent via PEG 4 times daily with 60 mL free water before and after bolus feeding. Flush PEG with an additional 240 mL free water 3 times daily. Please send supplies and formula ASAP., Disp: , Rfl:  .  oxyCODONE-acetaminophen (PERCOCET) 5-325 mg per tablet, Take by mouth., Disp: , Rfl:  . polyethylene glycol (GLYCOLAX, MIRALAX) 17 gram/dose powder, Try 3 times daily until you have a bowel movement. Reduce to once a day once you have a movement to prevent constipation., Disp: , Rfl:  . scopolamine (TRANSDERM-SCOP) 1 mg over 3 days patch, Place 1.5 mg onto the skin., Disp: , Rfl:   A complete ROS was performed with pertinent positives/negatives noted in the HPI. The remainder of the ROS are negative.   Physical Exam:   BP 121/73  Pulse 63  Temp 96.9 F (36.1 C)  Ht 1.6 m (5\' 3" )  Wt 52.6 kg (116 lb)  BMI 20.55 kg/m   General: Thin gentleman, in no  distress, breathing easily. Normal affect. In a pleasant mood. Head: Normocephalic, atraumatic. No masses, or scars. Eyes: Pupils are equal, and reactive to light. Vision is grossly intact. No spontaneous or gaze nystagmus. Ears: Ear canals are clear. Tympanic membranes are intact, with normal landmarks and the middle ears are clear and healthy. Hearing: Grossly normal. Nose: Nasal cavities are clear with healthy mucosa, no polyps or exudate. Airways are patent. Face: No masses or scars, facial nerve function is symmetric. Oral Cavity: Oral mucositis in patchy areas. The area of the tongue where there was a white lesion is mostly healed now. The mandibular bone is significantly atrophic. There is no obvious mucosal ulceration. There is a slight firm fullness throughout the tongue and floor of mouth. The oropharynx looks healthy and clear. Oropharynx: There are no mucosal masses identified. Tongue base appears normal and healthy. Larynx/Hypopharynx: Difficult to examine, supraglottic larynx looks healthy Chest: Deferred Neck: Soft level 3/4 noted on the right, nontender. Bilateral submandibular firm masses still present, each about 2 cm, no thyroid nodules or enlargement. Neuro: Cranial nerves II-XII with normal function. Balance: Normal gate. Other findings: none.  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 This is not typical treatment for these lesions but he refused surgery. He seems to have had an excellent response. He is going to have a PET scan in a few months for follow-up. I would like to see him back every other month for now to reassess the lymph nodes and the oral exam. The lesion that he had on the tongue seems to have resolved with anticandidiasis treatment. Continue using the feeding tube until his nutritional status has improved.

## 2020-09-17 ENCOUNTER — Ambulatory Visit (HOSPITAL_BASED_OUTPATIENT_CLINIC_OR_DEPARTMENT_OTHER): Payer: Medicaid Other | Admitting: Anesthesiology

## 2020-09-17 ENCOUNTER — Ambulatory Visit (HOSPITAL_BASED_OUTPATIENT_CLINIC_OR_DEPARTMENT_OTHER)
Admission: RE | Admit: 2020-09-17 | Discharge: 2020-09-17 | Disposition: A | Discharge status: Home / Self Care | Payer: Medicaid Other | Source: Ambulatory Visit | Attending: Otolaryngology | Admitting: Otolaryngology

## 2020-09-17 ENCOUNTER — Encounter (HOSPITAL_BASED_OUTPATIENT_CLINIC_OR_DEPARTMENT_OTHER): Payer: Self-pay | Admitting: Otolaryngology

## 2020-09-17 ENCOUNTER — Encounter (HOSPITAL_BASED_OUTPATIENT_CLINIC_OR_DEPARTMENT_OTHER)
Admission: RE | Disposition: A | Discharge status: Home / Self Care | Payer: Self-pay | Source: Ambulatory Visit | Attending: Otolaryngology

## 2020-09-17 ENCOUNTER — Other Ambulatory Visit: Payer: Self-pay

## 2020-09-17 DIAGNOSIS — K121 Other forms of stomatitis: Secondary | ICD-10-CM | POA: Insufficient documentation

## 2020-09-17 DIAGNOSIS — Z9221 Personal history of antineoplastic chemotherapy: Secondary | ICD-10-CM | POA: Insufficient documentation

## 2020-09-17 DIAGNOSIS — Z85818 Personal history of malignant neoplasm of other sites of lip, oral cavity, and pharynx: Secondary | ICD-10-CM | POA: Insufficient documentation

## 2020-09-17 DIAGNOSIS — C023 Malignant neoplasm of anterior two-thirds of tongue, part unspecified: Secondary | ICD-10-CM | POA: Diagnosis not present

## 2020-09-17 DIAGNOSIS — Z79891 Long term (current) use of opiate analgesic: Secondary | ICD-10-CM | POA: Diagnosis not present

## 2020-09-17 DIAGNOSIS — F32A Depression, unspecified: Secondary | ICD-10-CM | POA: Diagnosis not present

## 2020-09-17 DIAGNOSIS — Z87891 Personal history of nicotine dependence: Secondary | ICD-10-CM | POA: Diagnosis not present

## 2020-09-17 DIAGNOSIS — Z923 Personal history of irradiation: Secondary | ICD-10-CM | POA: Insufficient documentation

## 2020-09-17 DIAGNOSIS — C049 Malignant neoplasm of floor of mouth, unspecified: Secondary | ICD-10-CM | POA: Diagnosis not present

## 2020-09-17 DIAGNOSIS — Z79899 Other long term (current) drug therapy: Secondary | ICD-10-CM | POA: Diagnosis not present

## 2020-09-17 HISTORY — DX: Depression, unspecified: F32.A

## 2020-09-17 HISTORY — PX: EXCISION OF TONGUE LESION: SHX6434

## 2020-09-17 SURGERY — EXCISION, LESION, TONGUE
Anesthesia: General | Site: Mouth | Laterality: Left

## 2020-09-17 MED ORDER — FENTANYL CITRATE (PF) 100 MCG/2ML IJ SOLN: 25.0000 ug | INTRAMUSCULAR | Status: DC | PRN

## 2020-09-17 MED ORDER — LIDOCAINE 2% (20 MG/ML) 5 ML SYRINGE
INTRAMUSCULAR | Status: AC
  Filled 2020-09-17: qty 5

## 2020-09-17 MED ORDER — DEXAMETHASONE SODIUM PHOSPHATE 10 MG/ML IJ SOLN
INTRAMUSCULAR | Status: AC
  Filled 2020-09-17: qty 1

## 2020-09-17 MED ORDER — LIDOCAINE-EPINEPHRINE 1 %-1:100000 IJ SOLN
INTRAMUSCULAR | Status: DC | PRN
  Administered 2020-09-17: .5 mL

## 2020-09-17 MED ORDER — OXYCODONE HCL 5 MG/5ML PO SOLN: 5.0000 mg | Freq: Once | ORAL | Status: DC | PRN

## 2020-09-17 MED ORDER — ACETAMINOPHEN 500 MG PO TABS
ORAL_TABLET | ORAL | Status: AC
  Filled 2020-09-17: qty 2

## 2020-09-17 MED ORDER — LIDOCAINE 2% (20 MG/ML) 5 ML SYRINGE
INTRAMUSCULAR | Status: DC | PRN
  Administered 2020-09-17: 80 mg via INTRAVENOUS

## 2020-09-17 MED ORDER — PROPOFOL 500 MG/50ML IV EMUL
INTRAVENOUS | Status: DC | PRN
  Administered 2020-09-17: 50 ug/kg/min via INTRAVENOUS

## 2020-09-17 MED ORDER — PROPOFOL 10 MG/ML IV BOLUS
INTRAVENOUS | Status: DC | PRN
  Administered 2020-09-17: 120 mg via INTRAVENOUS

## 2020-09-17 MED ORDER — LACTATED RINGERS IV SOLN: INTRAVENOUS | Status: DC | PRN

## 2020-09-17 MED ORDER — MIDAZOLAM HCL 5 MG/5ML IJ SOLN
INTRAMUSCULAR | Status: DC | PRN
  Administered 2020-09-17: 2 mg via INTRAVENOUS

## 2020-09-17 MED ORDER — FENTANYL CITRATE (PF) 100 MCG/2ML IJ SOLN
INTRAMUSCULAR | Status: DC | PRN
  Administered 2020-09-17 (×2): 50 ug via INTRAVENOUS

## 2020-09-17 MED ORDER — DEXAMETHASONE SODIUM PHOSPHATE 4 MG/ML IJ SOLN
INTRAMUSCULAR | Status: DC | PRN
  Administered 2020-09-17: 10 mg via INTRAVENOUS

## 2020-09-17 MED ORDER — OXYMETAZOLINE HCL 0.05 % NA SOLN
NASAL | Status: AC
  Filled 2020-09-17: qty 30

## 2020-09-17 MED ORDER — ONDANSETRON HCL 4 MG/2ML IJ SOLN
INTRAMUSCULAR | Status: AC
  Filled 2020-09-17: qty 2

## 2020-09-17 MED ORDER — PROPOFOL 10 MG/ML IV BOLUS
INTRAVENOUS | Status: AC
  Filled 2020-09-17: qty 20

## 2020-09-17 MED ORDER — ONDANSETRON HCL 4 MG/2ML IJ SOLN
INTRAMUSCULAR | Status: DC | PRN
  Administered 2020-09-17: 4 mg via INTRAVENOUS

## 2020-09-17 MED ORDER — PROMETHAZINE HCL 25 MG/ML IJ SOLN: 6.2500 mg | INTRAMUSCULAR | Status: DC | PRN

## 2020-09-17 MED ORDER — FENTANYL CITRATE (PF) 100 MCG/2ML IJ SOLN
INTRAMUSCULAR | Status: AC
  Filled 2020-09-17: qty 2

## 2020-09-17 MED ORDER — MIDAZOLAM HCL 2 MG/2ML IJ SOLN
INTRAMUSCULAR | Status: AC
  Filled 2020-09-17: qty 2

## 2020-09-17 MED ORDER — OXYCODONE HCL 5 MG PO TABS: 5.0000 mg | ORAL_TABLET | Freq: Once | ORAL | Status: DC | PRN

## 2020-09-17 MED ORDER — PROPOFOL 500 MG/50ML IV EMUL
INTRAVENOUS | Status: AC
  Filled 2020-09-17: qty 50

## 2020-09-17 MED ORDER — SUCCINYLCHOLINE CHLORIDE 20 MG/ML IJ SOLN
INTRAMUSCULAR | Status: DC | PRN
  Administered 2020-09-17: 80 mg via INTRAVENOUS

## 2020-09-17 MED ORDER — ACETAMINOPHEN 500 MG PO TABS
1000.0000 mg | ORAL_TABLET | Freq: Once | ORAL | Status: AC
  Administered 2020-09-17: 1000 mg via ORAL

## 2020-09-17 MED ORDER — EPHEDRINE SULFATE 50 MG/ML IJ SOLN
INTRAMUSCULAR | Status: DC | PRN
  Administered 2020-09-17 (×2): 10 mg via INTRAVENOUS

## 2020-09-17 MED ORDER — LIDOCAINE-EPINEPHRINE 1 %-1:100000 IJ SOLN
INTRAMUSCULAR | Status: AC
  Filled 2020-09-17: qty 1

## 2020-09-17 SURGICAL SUPPLY — 39 items
BLADE SURG 15 STRL LF DISP TIS (BLADE) ×1 IMPLANT
BLADE SURG 15 STRL SS (BLADE) ×2
BUR EGG 3PK/BX (BURR) IMPLANT
CANISTER SUCT 1200ML W/VALVE (MISCELLANEOUS) ×2 IMPLANT
CATH ROBINSON RED A/P 12FR (CATHETERS) IMPLANT
COAGULATOR SUCT SWTCH 10FR 6 (ELECTROSURGICAL) IMPLANT
COVER MAYO STAND STRL (DRAPES) ×2 IMPLANT
COVER WAND RF STERILE (DRAPES) IMPLANT
ELECT COATED BLADE 2.86 ST (ELECTRODE) IMPLANT
ELECT REM PT RETURN 9FT ADLT (ELECTROSURGICAL) ×2
ELECTRODE REM PT RTRN 9FT ADLT (ELECTROSURGICAL) ×1 IMPLANT
GLOVE SURG LTX SZ6.5 (GLOVE) ×2 IMPLANT
GLOVE SURG LTX SZ7.5 (GLOVE) ×2 IMPLANT
GLOVE SURG UNDER POLY LF SZ7 (GLOVE) ×2 IMPLANT
GOWN STRL REUS W/ TWL LRG LVL3 (GOWN DISPOSABLE) ×1 IMPLANT
GOWN STRL REUS W/TWL LRG LVL3 (GOWN DISPOSABLE) ×2
HEMOSTAT SNOW SURGICEL 2X4 (HEMOSTASIS) IMPLANT
HEMOSTAT SURGICEL .5X2 ABSORB (HEMOSTASIS) IMPLANT
HEMOSTAT SURGICEL 2X14 (HEMOSTASIS) IMPLANT
MARKER SKIN DUAL TIP RULER LAB (MISCELLANEOUS) IMPLANT
NEEDLE BIOPSY 14X6 SOFT TISS (NEEDLE) ×2 IMPLANT
NEEDLE PRECISIONGLIDE 27X1.5 (NEEDLE) ×2 IMPLANT
NS IRRIG 1000ML POUR BTL (IV SOLUTION) ×2 IMPLANT
PACK BASIN DAY SURGERY FS (CUSTOM PROCEDURE TRAY) ×2 IMPLANT
PATTIES SURGICAL .5 X3 (DISPOSABLE) IMPLANT
PENCIL FOOT CONTROL (ELECTRODE) IMPLANT
SHEET MEDIUM DRAPE 40X70 STRL (DRAPES) ×2 IMPLANT
SLEEVE SCD COMPRESS KNEE MED (MISCELLANEOUS) IMPLANT
SOLUTION BUTLER CLEAR DIP (MISCELLANEOUS) IMPLANT
SPONGE TONSIL TAPE 1 RFD (DISPOSABLE) IMPLANT
SPONGE TONSIL TAPE 1.25 RFD (DISPOSABLE) IMPLANT
SUT SILK 3 0 PS 1 (SUTURE) IMPLANT
SUT VIC AB 3-0 SH 27 (SUTURE)
SUT VIC AB 3-0 SH 27X BRD (SUTURE) IMPLANT
SYR BULB EAR ULCER 3OZ GRN STR (SYRINGE) IMPLANT
SYR CONTROL 10ML LL (SYRINGE) ×2 IMPLANT
TOWEL GREEN STERILE FF (TOWEL DISPOSABLE) ×2 IMPLANT
TUBE CONNECTING 20X1/4 (TUBING) ×2 IMPLANT
TUBE SALEM SUMP 16 FR W/ARV (TUBING) IMPLANT

## 2020-09-17 NOTE — Discharge Instructions (Signed)
Resume diet as tolerated.  May take Tylenol after 2pm, if needed.    Post Anesthesia Home Care Instructions  Activity: Get plenty of rest for the remainder of the day. A responsible individual must stay with you for 24 hours following the procedure.  For the next 24 hours, DO NOT: -Drive a car -Paediatric nurse -Drink alcoholic beverages -Take any medication unless instructed by your physician -Make any legal decisions or sign important papers.  Meals: Start with liquid foods such as gelatin or soup. Progress to regular foods as tolerated. Avoid greasy, spicy, heavy foods. If nausea and/or vomiting occur, drink only clear liquids until the nausea and/or vomiting subsides. Call your physician if vomiting continues.  Special Instructions/Symptoms: Your throat may feel dry or sore from the anesthesia or the breathing tube placed in your throat during surgery. If this causes discomfort, gargle with warm salt water. The discomfort should disappear within 24 hours.  If you had a scopolamine patch placed behind your ear for the management of post- operative nausea and/or vomiting:  1. The medication in the patch is effective for 72 hours, after which it should be removed.  Wrap patch in a tissue and discard in the trash. Wash hands thoroughly with soap and water. 2. You may remove the patch earlier than 72 hours if you experience unpleasant side effects which may include dry mouth, dizziness or visual disturbances. 3. Avoid touching the patch. Wash your hands with soap and water after contact with the patch.

## 2020-09-17 NOTE — Op Note (Signed)
OPERATIVE REPORT  DATE OF SURGERY: 09/17/2020  PATIENT:  Casey [name],  55 y.o. male  PRE-OPERATIVE DIAGNOSIS:  Cancer of floor of mouth  POST-OPERATIVE DIAGNOSIS:  Cancer of floor of mouth  PROCEDURE:  Procedure(s): Biopsy of Anterior Tongue  SURGEON:  Beckie Salts, MD  ASSISTANTS: None  ANESTHESIA:   General   EBL: 10 ml  DRAINS: None  LOCAL MEDICATIONS USED: 1% Xylocaine with epinephrine  SPECIMEN: Tongue/floor of mouth biopsy  COUNTS:  Correct  PROCEDURE DETAILS: The patient was taken to the operating room and placed on the operating table in the supine position. Following induction of general endotracheal anesthesia, the tongue and floor of mouth were inspected.  There was a 1 cm ulceration along the junction of the anterior left floor of mouth and the root of the tongue.  Otherwise the mucosa was all intact.  By palpation there was firmness within the depths of the root of the tongue and midline floor of mouth.  The ulceration was suctioned of debris and local anesthetic was infiltrated around this.  A Tru-Cut biopsy needle was used in 3 passes and the specimen was sent for frozen section analysis.  Frozen section did not reveal any carcinoma.  2 additional passes were taken and sent for pathologic evaluation in formalin.  Patient was then awakened extubated and transferred to recovery in stable condition.    PATIENT DISPOSITION:  To PACU, stable

## 2020-09-17 NOTE — Anesthesia Preprocedure Evaluation (Addendum)
Anesthesia Evaluation  Patient identified by MRN, date of birth, ID band Patient awake    Reviewed: Allergy & Precautions, NPO status , Patient's Chart, lab work & pertinent test results  History of Anesthesia Complications Negative for: history of anesthetic complications  Airway Mallampati: III  TM Distance: >3 FB Neck ROM: Full    Dental  (+) Edentulous Upper, Edentulous Lower   Pulmonary Current Smoker and Patient abstained from smoking.,    Pulmonary exam normal        Cardiovascular negative cardio ROS Normal cardiovascular exam     Neuro/Psych negative neurological ROS  negative psych ROS   GI/Hepatic negative GI ROS, Neg liver ROS,   Endo/Other  negative endocrine ROS  Renal/GU negative Renal ROS  negative genitourinary   Musculoskeletal negative musculoskeletal ROS (+)   Abdominal   Peds  Hematology negative hematology ROS (+)   Anesthesia Other Findings 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  Reproductive/Obstetrics negative OB ROS                            Anesthesia Physical Anesthesia Plan  ASA: II  Anesthesia Plan: General   Post-op Pain Management:    Induction: Intravenous  PONV Risk Score and Plan: 2 and Treatment may vary due to age or medical condition, Ondansetron, Dexamethasone and Midazolam  Airway Management Planned: Oral ETT and Video Laryngoscope Planned  Additional Equipment: None  Intra-op Plan:   Post-operative Plan: Extubation in OR  Informed Consent: I have reviewed the patients History and Physical, chart, labs and discussed the procedure including the risks, benefits and alternatives for the proposed anesthesia with the patient or authorized representative who has indicated his/her understanding and acceptance.     Dental advisory given  Plan Discussed with: CRNA  Anesthesia Plan Comments:        Anesthesia Quick Evaluation

## 2020-09-17 NOTE — Transfer of Care (Signed)
Immediate Anesthesia Transfer of Care Note  Patient: Casey [name]  Procedure(s) Performed: Biopsy of Anterior Tongue (N/A )  Patient Location: PACU  Anesthesia Type:General  Level of Consciousness: sedated  Airway & Oxygen Therapy: Patient Spontanous Breathing and Patient connected to face mask oxygen  Post-op Assessment: Report given to RN and Post -op Vital signs reviewed and stable  Post vital signs: Reviewed and stable  Last Vitals:  Vitals Value Taken Time  BP 152/85 09/17/20 0922  Temp    Pulse 82 09/17/20 0924  Resp 10 09/17/20 0924  SpO2 100 % 09/17/20 0924  Vitals shown include unvalidated device data.  Last Pain:  Vitals:   09/17/20 0801  TempSrc: Oral  PainSc: 0-No pain         Complications: No complications documented.

## 2020-09-17 NOTE — Anesthesia Postprocedure Evaluation (Signed)
Anesthesia Post Note  Patient: Husam Hohn Bogdan  Procedure(s) Performed: Biopsy of Anterior Tongue (N/A )     Patient location during evaluation: PACU Anesthesia Type: General Level of consciousness: awake and alert and oriented Pain management: pain level controlled Vital Signs Assessment: post-procedure vital signs reviewed and stable Respiratory status: spontaneous breathing, nonlabored ventilation and respiratory function stable Cardiovascular status: blood pressure returned to baseline Postop Assessment: no apparent nausea or vomiting Anesthetic complications: no   No complications documented.  Last Vitals:  Vitals:   09/17/20 0935 09/17/20 0947  BP: (!) 145/85 (!) 142/88  Pulse: 75 72  Resp: 13 18  Temp:  (!) 36.2 C  SpO2: 95% 100%    Last Pain:  Vitals:   09/17/20 0947  TempSrc:   PainSc: 0-No pain                 Brennan Bailey

## 2020-09-17 NOTE — Anesthesia Procedure Notes (Signed)
Procedure Name: Intubation Date/Time: 09/17/2020 8:28 AM Performed by: Maryella Shivers, CRNA Pre-anesthesia Checklist: Patient identified, Emergency Drugs available, Suction available and Patient being monitored Patient Re-evaluated:Patient Re-evaluated prior to induction Oxygen Delivery Method: Circle system utilized Preoxygenation: Pre-oxygenation with 100% oxygen Induction Type: IV induction Ventilation: Mask ventilation without difficulty Laryngoscope Size: Glidescope, Mac and 3 Grade View: Grade I Tube type: Oral Number of attempts: 1 Airway Equipment and Method: Stylet and Oral airway Placement Confirmation: ETT inserted through vocal cords under direct vision,  positive ETCO2 and breath sounds checked- equal and bilateral Secured at: 22 cm Tube secured with: Tape Dental Injury: Teeth and Oropharynx as per pre-operative assessment

## 2020-09-18 ENCOUNTER — Encounter (HOSPITAL_BASED_OUTPATIENT_CLINIC_OR_DEPARTMENT_OTHER): Payer: Self-pay | Admitting: Otolaryngology

## 2020-09-18 ENCOUNTER — Other Ambulatory Visit: Payer: Self-pay

## 2020-09-18 DIAGNOSIS — C049 Malignant neoplasm of floor of mouth, unspecified: Secondary | ICD-10-CM

## 2020-09-18 LAB — SURGICAL PATHOLOGY

## 2020-09-21 ENCOUNTER — Other Ambulatory Visit: Payer: Self-pay

## 2020-09-21 DIAGNOSIS — C049 Malignant neoplasm of floor of mouth, unspecified: Secondary | ICD-10-CM

## 2020-09-28 ENCOUNTER — Other Ambulatory Visit (HOSPITAL_COMMUNITY): Payer: Self-pay | Admitting: Hematology and Oncology

## 2020-09-28 MED FILL — LIDOCAINE 2% VISCOUS SOLN: 2 | 3 days supply | Qty: 200 | Fill #2

## 2020-10-01 ENCOUNTER — Other Ambulatory Visit: Payer: Self-pay

## 2020-10-01 ENCOUNTER — Ambulatory Visit: Payer: Medicaid Other | Attending: Radiation Oncology

## 2020-10-01 DIAGNOSIS — I890 Lymphedema, not elsewhere classified: Secondary | ICD-10-CM | POA: Diagnosis not present

## 2020-10-01 DIAGNOSIS — C049 Malignant neoplasm of floor of mouth, unspecified: Secondary | ICD-10-CM | POA: Insufficient documentation

## 2020-10-01 DIAGNOSIS — R293 Abnormal posture: Secondary | ICD-10-CM | POA: Insufficient documentation

## 2020-10-01 NOTE — Therapy (Signed)
[place], [place], [address] Phone: [phone]   Fax:  [phone]  Physical Therapy Evaluation  Patient Details  Name: Casey [name] MRN: [id number] Date of Birth: [birth year] Referring Provider (PT): Reita May Date: 10/01/2020   PT End of Session - 10/01/20 1546    Visit Number 2    Number of Visits 12    Date for PT Re-Evaluation 11/12/20    Authorization Type [employer]    PT Start Time 0300    PT Stop Time 1455    PT Time Calculation (min) 50 min    Activity Tolerance Patient tolerated treatment well    Behavior During Therapy [facility] for tasks assessed/performed           Past Medical History:  Diagnosis Date  . Cancer ([place])   . Depression     Past Surgical History:  Procedure Laterality Date  . EXCISION OF TONGUE LESION Left 09/17/2020   Procedure: Biopsy of Anterior Tongue;  Surgeon: [name], MD;  Location: [facility];  Service: ENT;  Laterality: Left;  . IR GASTROSTOMY TUBE MOD SED  04/03/2020  . IR IMAGING GUIDED PORT INSERTION  04/03/2020    There were no vitals filed for this visit.    Subjective Assessment - 10/01/20 1410    Subjective Went to the Dentist in Dec. 2020 and when they went to fit his dentures there was a problem with the lower denture becuase his tongue was so thick, and he was referred to another doctor. 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 He had 3 chemo treatments over 9 weeks and radiation 5 days /week for 9 weeks. Pt. developed neck swelling about a month ago.  Swelling reduces some during the day.  He has some pain in the tongue and lower jaw.  He does not have any sores in his mouth.  He uses a 2% lidocaine solution in his mouth for the discomfort. He has 3 MD appts this week. He is supposed to have his Peg and porta-cath removed on Wednesday.    Pertinent History squamous cell carcinoma floor of  mouth, tongue biopsy in May 2021,  Had  concurrent radiation to tongue and bilateral neck and chemo.  Had a PEG that will be removed on Wednesday.    Patient Stated Goals to reduce swelling    Currently in Pain? Yes    Pain Score 5     Pain Location Mouth   tongue   Pain Orientation Lower    Pain Descriptors / Indicators Sore;Tender;Aching    Pain Type Acute pain    Pain Onset More than a month ago    Pain Frequency Constant    Aggravating Factors  dentures,    Multiple Pain Sites No              [facility] PT Assessment - 10/01/20 0001      Assessment   Medical Diagnosis floor of mouth cancer    Referring Provider (PT) [place]    Onset Date/Surgical Date 12/03/19    Hand Dominance Right    Prior Therapy none      Precautions   Precaution Comments lymphedema      Restrictions   Weight Bearing Restrictions No      Balance Screen   Has the patient fallen in the past 6 months No    Has the patient had a decrease in activity level because of  a fear of falling?  No    Is the patient reluctant to leave their home because of a fear of falling?  No      Home Environment   Living Environment Private residence    Living Arrangements Other relatives    Available Help at Discharge Family    Type of [place] Access Level entry    Home Layout Two level      Prior Function   Level of [place] Retired   applying for disability   Leisure walks dogs 3x/day for 10 min      Cognition   Overall Cognitive Status Within Functional Limits for tasks assessed      Posture/Postural Control   Posture/Postural Control Postural limitations    Postural Limitations Rounded Shoulders;Forward head      AROM   Overall AROM Comments R shoulder limited to 90 degrees flex and abduction due to DDD with narrowing of spine- L shoulder WFL    Cervical Flexion WNL    Cervical Extension WNL    Cervical - Right Side Bend 43    Cervical - Left Side Bend 35     Cervical - Right Rotation 60    Cervical - Left Rotation 50             LYMPHEDEMA/ONCOLOGY QUESTIONNAIRE - 10/01/20 0001      Type   Cancer Type 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      Treatment   Active Chemotherapy Treatment No    Past Chemotherapy Treatment Yes    Date 06/06/20    Active Radiation Treatment No    Past Radiation Treatment Yes    Date 06/26/20    Current Hormone Treatment No    Past Hormone Therapy No      What other symptoms do you have   Are you Having Heaviness or Tightness No    Are you having Pain Yes   tongue and loser jaw   Are you having pitting edema No    Do you have infections No    Is there Decreased scar mobility No      Head and Neck   4 cm superior to sternal notch around neck 41.5 cm    6 cm superior to sternal notch around neck 43 cm    8 cm superior to sternal notch around neck 44.5 cm    Other tragus to tragus   24.0                  Objective measurements completed on examination: See above findings.               PT Education - 10/01/20 1540    Education Details Pt was instructed in cervical ROM, proper sitting posture, deep breathing and importance of staying active. He was educated about lymphedema and was given info on lymphedema including Flexi touch.  A chip pack was made for swelling under his chin and he was advised to wear when he is at home for several hours at a time as long as it is comfortable for him.    Person(s) Educated Patient    Methods Explanation;Demonstration;Handout    Comprehension Verbalized understanding;Returned demonstration               PT Long Term Goals - 10/01/20 2106      PT LONG TERM GOAL #1   Title pt will be independent  in a HEP for cervical ROM and posture    Time 6    Period Weeks    Status New    Target Date 11/12/20      PT LONG TERM GOAL #2   Title pt will be independent in self Neck MLD to reduce edema    Time 6    Period Weeks     Status New    Target Date 11/12/20      PT LONG TERM GOAL #3   Title pt will have decreased neck edema by atleast 50%    Time 6    Period Weeks    Status New    Target Date 11/12/20      PT LONG TERM GOAL #4   Title Pt will be fit for appropriate compression garments to decrease lymphedema    Time 6    Period Weeks    Status New    Target Date 11/12/20      PT LONG TERM GOAL #5   Title Pt will be independent in self management of lymphedema    Time 6    Period Weeks    Status New    Target Date 11/12/20                  Plan - 10/01/20 1548    Clinical Impression Statement Pt was diagnosed with 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  He underwent recurrent chemo/radiation to tongue and bilateral neck. Approximately 4 weeks ago he developed extensive swelling under his chin.  He was briefly educated in lymphedema and its causes as well as treatments.  We discussed MLD and Flexitouch as being good options for him.  I made a chip pack for him to wear under his chin to decrease edema. He was also instructed in cervical ROM exercises.  He is already on a good walking program but was given exercise handouts again. He has limited Right shoulder AROM secondary to cervical DDD.  He is also complaining of lower jaw pain and is not able to wear his lower dentures.    Personal Factors and Comorbidities Comorbidity 1;Comorbidity 2    Comorbidities DDD with narrowing of spine, Squamos Cell Carcinoma post radiation/chemo    Examination-Activity Limitations Reach Overhead    Stability/Clinical Decision Making Stable/Uncomplicated    Rehab Potential Good    PT Frequency 2x / week    PT Duration 6 weeks    PT Treatment/Interventions ADLs/Self Care Home Management;Therapeutic exercise;Patient/family education;Manual techniques;Manual lymph drainage;Vasopneumatic Device;Passive range of motion    PT Next Visit Plan assess benefit of chip pack, see  how he is doing with neck exercises, MLD to anterior neck, and start instructing pt.  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) 03/07/2020  Claris Pong 10/01/2020, 9:13 PM  [place], [place], [address] Phone: [phone]   Fax:  [phone]  Name: Casey [name] MRN: [id number] Date of Birth: 1965-10-25 Cheral Almas, PT 10/01/20 9:17 PM

## 2020-10-01 NOTE — Patient Instructions (Signed)
Pt was instructed in cervical ROM exs to restore ROM.  He was educated in lymphedema and various treatments for lymphedema.

## 2020-10-02 ENCOUNTER — Other Ambulatory Visit: Payer: Self-pay | Admitting: Student

## 2020-10-02 ENCOUNTER — Other Ambulatory Visit: Payer: Self-pay | Admitting: Radiology

## 2020-10-02 DIAGNOSIS — Z419 Encounter for procedure for purposes other than remedying health state, unspecified: Secondary | ICD-10-CM | POA: Diagnosis not present

## 2020-10-03 ENCOUNTER — Encounter: Payer: Self-pay | Admitting: Hematology and Oncology

## 2020-10-03 ENCOUNTER — Ambulatory Visit: Payer: Medicaid Other

## 2020-10-03 ENCOUNTER — Inpatient Hospital Stay: Payer: Medicaid Other | Attending: Hematology | Admitting: Hematology and Oncology

## 2020-10-03 ENCOUNTER — Other Ambulatory Visit: Payer: Self-pay

## 2020-10-03 ENCOUNTER — Other Ambulatory Visit: Payer: Self-pay | Admitting: Hematology and Oncology

## 2020-10-03 ENCOUNTER — Telehealth: Payer: Self-pay | Admitting: Hematology and Oncology

## 2020-10-03 ENCOUNTER — Ambulatory Visit (HOSPITAL_COMMUNITY)
Admission: RE | Admit: 2020-10-03 | Discharge: 2020-10-03 | Disposition: A | Discharge status: Home / Self Care | Payer: Medicaid Other | Source: Ambulatory Visit | Attending: Hematology and Oncology | Admitting: Hematology and Oncology

## 2020-10-03 ENCOUNTER — Encounter (HOSPITAL_COMMUNITY): Payer: Self-pay

## 2020-10-03 VITALS — BP 149/99 | HR 74 | Temp 97.8°F | Resp 18 | Ht 63.0 in | Wt 120.3 lb

## 2020-10-03 DIAGNOSIS — Z4659 Encounter for fitting and adjustment of other gastrointestinal appliance and device: Secondary | ICD-10-CM | POA: Insufficient documentation

## 2020-10-03 DIAGNOSIS — Z9221 Personal history of antineoplastic chemotherapy: Secondary | ICD-10-CM | POA: Insufficient documentation

## 2020-10-03 DIAGNOSIS — Z452 Encounter for adjustment and management of vascular access device: Secondary | ICD-10-CM | POA: Diagnosis not present

## 2020-10-03 DIAGNOSIS — C049 Malignant neoplasm of floor of mouth, unspecified: Secondary | ICD-10-CM

## 2020-10-03 DIAGNOSIS — K146 Glossodynia: Secondary | ICD-10-CM

## 2020-10-03 DIAGNOSIS — Z85818 Personal history of malignant neoplasm of other sites of lip, oral cavity, and pharynx: Secondary | ICD-10-CM | POA: Insufficient documentation

## 2020-10-03 DIAGNOSIS — I890 Lymphedema, not elsewhere classified: Secondary | ICD-10-CM | POA: Diagnosis not present

## 2020-10-03 DIAGNOSIS — Z79899 Other long term (current) drug therapy: Secondary | ICD-10-CM | POA: Diagnosis not present

## 2020-10-03 DIAGNOSIS — F17210 Nicotine dependence, cigarettes, uncomplicated: Secondary | ICD-10-CM | POA: Insufficient documentation

## 2020-10-03 DIAGNOSIS — Z923 Personal history of irradiation: Secondary | ICD-10-CM | POA: Diagnosis not present

## 2020-10-03 DIAGNOSIS — Z7952 Long term (current) use of systemic steroids: Secondary | ICD-10-CM | POA: Insufficient documentation

## 2020-10-03 DIAGNOSIS — Z431 Encounter for attention to gastrostomy: Secondary | ICD-10-CM | POA: Diagnosis not present

## 2020-10-03 HISTORY — PX: IR GASTROSTOMY TUBE REMOVAL: IMG5492

## 2020-10-03 HISTORY — PX: IR REMOVAL TUN ACCESS W/ PORT W/O FL MOD SED: IMG2290

## 2020-10-03 LAB — CBC WITH DIFFERENTIAL/PLATELET
Abs Immature Granulocytes: 0.1 10*3/uL — ABNORMAL HIGH (ref 0.00–0.07)
Basophils Absolute: 0.1 10*3/uL (ref 0.0–0.1)
Basophils Relative: 1 %
Eosinophils Absolute: 0.1 10*3/uL (ref 0.0–0.5)
Eosinophils Relative: 1 %
HCT: 36.5 % — ABNORMAL LOW (ref 39.0–52.0)
Hemoglobin: 12.3 g/dL — ABNORMAL LOW (ref 13.0–17.0)
Immature Granulocytes: 2 %
Lymphocytes Relative: 12 %
Lymphs Abs: 0.8 10*3/uL (ref 0.7–4.0)
MCH: 33.7 pg (ref 26.0–34.0)
MCHC: 33.7 g/dL (ref 30.0–36.0)
MCV: 100 fL (ref 80.0–100.0)
Monocytes Absolute: 0.7 10*3/uL (ref 0.1–1.0)
Monocytes Relative: 10 %
Neutro Abs: 4.8 10*3/uL (ref 1.7–7.7)
Neutrophils Relative %: 74 %
Platelets: 247 10*3/uL (ref 150–400)
RBC: 3.65 MIL/uL — ABNORMAL LOW (ref 4.22–5.81)
RDW: 12.6 % (ref 11.5–15.5)
WBC: 6.5 10*3/uL (ref 4.0–10.5)
nRBC: 0 % (ref 0.0–0.2)

## 2020-10-03 LAB — PROTIME-INR
INR: 1 (ref 0.8–1.2)
Prothrombin Time: 13 seconds (ref 11.4–15.2)

## 2020-10-03 MED ORDER — FENTANYL CITRATE (PF) 100 MCG/2ML IJ SOLN
INTRAMUSCULAR | Status: AC | PRN
  Administered 2020-10-03 (×2): 50 ug via INTRAVENOUS

## 2020-10-03 MED ORDER — FENTANYL CITRATE (PF) 100 MCG/2ML IJ SOLN
INTRAMUSCULAR | Status: AC
  Filled 2020-10-03: qty 4

## 2020-10-03 MED ORDER — LIDOCAINE-EPINEPHRINE 1 %-1:100000 IJ SOLN
INTRAMUSCULAR | Status: AC
  Filled 2020-10-03: qty 1

## 2020-10-03 MED ORDER — MIDAZOLAM HCL 2 MG/2ML IJ SOLN
INTRAMUSCULAR | Status: AC
  Filled 2020-10-03: qty 4

## 2020-10-03 MED ORDER — SODIUM CHLORIDE 0.9 % IV SOLN: INTRAVENOUS | Status: DC

## 2020-10-03 MED ORDER — MIDAZOLAM HCL 2 MG/2ML IJ SOLN
INTRAMUSCULAR | Status: AC | PRN
  Administered 2020-10-03: 2 mg via INTRAVENOUS
  Administered 2020-10-03: 0.5 mg via INTRAVENOUS

## 2020-10-03 MED ORDER — LIDOCAINE VISCOUS HCL 2 % MT SOLN
OROMUCOSAL | Status: AC
  Filled 2020-10-03: qty 15

## 2020-10-03 MED ORDER — OXYCODONE HCL 5 MG PO TABS: 5.0000 mg | ORAL_TABLET | Freq: Every day | ORAL | 0 refills | Status: DC | PRN

## 2020-10-03 MED ORDER — FENTANYL CITRATE (PF) 100 MCG/2ML IJ SOLN
INTRAMUSCULAR | Status: AC | PRN
  Administered 2020-10-03: 50 ug via INTRAVENOUS

## 2020-10-03 MED FILL — oxyCODONE HCL 5 MG TABS: 5 | 30 days supply | Qty: 30 | Fill #0

## 2020-10-03 NOTE — Telephone Encounter (Signed)
Scheduled appointment per 03/02 scheduled message. Contacted patient, he is aware.

## 2020-10-03 NOTE — Procedures (Signed)
Interventional Radiology Procedure Note  Procedure:  1) Port removal 2) Gastrostomy tube removal  Findings: Please refer to procedural dictation for full description.  Complications: None immediate  Estimated Blood Loss: <5 mL  Recommendations: Local wound care.  Follow up with IR as needed.   Ruthann Cancer, MD

## 2020-10-03 NOTE — Progress Notes (Signed)
Patient Care Team: Pcp, No as PCP - General Malmfelt, Stephani Police, RN as Oncology Nurse Navigator Eppie Gibson, MD as Attending Physician (Radiation Oncology) Nicholas Lose, MD as Consulting Physician (Hematology and Oncology) Sharen Counter, CCC-SLP as Speech Language Pathologist (Speech Pathology) Wynelle Beckmann, Melodie Bouillon, PT as Physical Therapist (Physical Therapy) Karie Mainland, RD as Dietitian (Nutrition) Karie Mainland, RD as Dietitian (Nutrition) Beverely Pace, LCSW as Social Worker (General Practice)  DIAGNOSIS:  No diagnosis found.  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 CT maxillofacial on 12/22/19 showed postsurgical changes and mandibular invasion, with cervical lymphadenopathy consistent with metastatic nodes. PET scan on 02/16/20 showed the tongue mass with lymph node metastases on the right floor of the mouth and cervical lymph nodes.   03/07/2020 Cancer Staging   Staging form: Oral Cavity, AJCC 8th Edition - Clinical: Stage IVA (cT4a, cN2b, cM0) - Signed by Nicholas Lose, MD on 03/07/2020   04/06/2020 -  Chemotherapy   The patient had dexamethasone (DECADRON) 4 MG tablet, 8 mg, Oral, Daily, 1 of 1 cycle, Start date: 04/26/2020, End date: -- palonosetron (ALOXI) injection 0.25 mg, 0.25 mg, Intravenous,  Once, 3 of 3 cycles Administration: 0.25 mg (04/06/2020), 0.25 mg (04/26/2020), 0.25 mg (05/17/2020) CISplatin (PLATINOL) 165 mg in sodium chloride 0.9 % 500 mL chemo infusion, 100 mg/m2 = 165 mg, Intravenous,  Once, 3 of 3 cycles Dose modification: 75 mg/m2 (original dose 100 mg/m2, Cycle 2, Reason: Provider Judgment) Administration: 165 mg (04/06/2020), 124 mg (04/26/2020), 124  mg (05/17/2020) fosaprepitant (EMEND) 150 mg in sodium chloride 0.9 % 145 mL IVPB, 150 mg, Intravenous,  Once, 3 of 3 cycles Administration: 150 mg (04/06/2020), 150 mg (04/26/2020), 150 mg (05/17/2020)  for chemotherapy treatment.      CHIEF COMPLIANT: Follow-up of floor of the mouth cancer  INTERVAL HISTORY: Casey [name] is a 55 y.o. with above-mentioned history of floor of the mouthcancer who completed treatment withconcurrent chemoradiation treatment withevery 3 weekcisplatin who is now on surveillance.  He most recently had a biopsy which didn't show definitive evidence of cancer. He complains of severe pain in the right side of the tongue along with tongue swelling. He has been using lidocaine and about 8-12 pills of ibuprofen daily for management of pain. He is hoping for a refill of oxycodone that he has used in the past. He is going to get the lymphedema device soon for management of neck swelling. He denies any change in breathing otherwise. PORT and PEG tube removed today He is able to eat most foods, but with his lack of teeth, he may have to avoid some foods He is 120 lbs now, used to be 135 before the treatment. Rest of the pertinent 10 point ROS reviewed and negative.  ALLERGIES:  has No Known Allergies.  MEDICATIONS:  Current Outpatient Medications  Medication Sig Dispense Refill  . dexamethasone (DECADRON) 1 MG tablet Take 1 tablet (1 mg total) by mouth every other day. 30 tablet 1  . docusate (COLACE) 50 MG/5ML liquid Take by mouth every other day.    . ibuprofen (ADVIL) 200 MG tablet Take 800 mg by mouth every 6 (six) hours as needed for moderate pain.    Marland Kitchen  nicotine (NICODERM CQ) 7 mg/24hr patch Place 1 patch (7 mg total) onto the skin daily.    Marland Kitchen oxyCODONE (OXY IR/ROXICODONE) 5 MG immediate release tablet Take 1 tablet (5 mg total) by mouth daily as needed for severe pain. 30 tablet 0  . fluconazole (DIFLUCAN) 100 MG tablet Take 1 tablet (100 mg total) by  mouth daily. (Patient not taking: Reported on 10/03/2020)    . Multiple Vitamin (MULTIVITAMIN) tablet Take 1 tablet by mouth daily. (Patient not taking: No sig reported)    . zolpidem (AMBIEN) 5 MG tablet Take 1 tablet (5 mg total) by mouth at bedtime as needed for sleep. (Patient not taking: Reported on 10/03/2020) 30 tablet 1   No current facility-administered medications for this visit.   Facility-Administered Medications Ordered in Other Visits  Medication Dose Route Frequency Provider Last Rate Last Admin  . 0.9 %  sodium chloride infusion   Intravenous Continuous Han, Aimee H, PA-C   Stopped at 10/03/20 1300  . lidocaine (XYLOCAINE) 2 % viscous mouth solution           . lidocaine-EPINEPHrine (XYLOCAINE W/EPI) 1 %-1:100000 (with pres) injection             PHYSICAL EXAMINATION: ECOG PERFORMANCE STATUS: 1 - Symptomatic but completely ambulatory  Vitals:   10/03/20 1325  BP: (!) 149/99  Pulse: 74  Resp: 18  Temp: 97.8 F (36.6 C)  SpO2: 100%   Filed Weights   10/03/20 1325  Weight: 120 lb 4.8 oz (54.6 kg)   Physical Exam Constitutional:      General: He is not in acute distress.    Appearance: Normal appearance. He is not ill-appearing.  HENT:     Head: Normocephalic and atraumatic.     Mouth/Throat:     Mouth: Mucous membranes are moist.     Pharynx: No oropharyngeal exudate or posterior oropharyngeal erythema.     Comments: Tongue swollen but no palpable mass Neck:     Comments: Lymphedema noted. Cardiovascular:     Rate and Rhythm: Normal rate and regular rhythm.     Pulses: Normal pulses.     Heart sounds: Normal heart sounds.  Pulmonary:     Effort: Pulmonary effort is normal.     Breath sounds: Normal breath sounds.  Abdominal:     General: Abdomen is flat. Bowel sounds are normal.  Musculoskeletal:        General: No swelling or tenderness. Normal range of motion.  Lymphadenopathy:     Cervical: No cervical adenopathy.  Skin:    General: Skin is warm and  dry.  Neurological:     General: No focal deficit present.     Mental Status: He is alert and oriented to person, place, and time.  Psychiatric:        Mood and Affect: Mood normal.        Behavior: Behavior normal.    LABORATORY DATA:  I have reviewed the data as listed CMP Latest Ref Rng & Units 06/15/2020 06/08/2020 05/31/2020  Glucose 70 - 99 mg/dL 137(H) 102(H) 197(H)  BUN 6 - 20 mg/dL 16 18 20   Creatinine 0.61 - 1.24 mg/dL 1.16 0.99 1.07  Sodium 135 - 145 mmol/L 136 136 134(L)  Potassium 3.5 - 5.1 mmol/L 4.5 4.7 4.4  Chloride 98 - 111 mmol/L 98 96(L) 99  CO2 22 - 32 mmol/L 30 28 27   Calcium 8.9 - 10.3 mg/dL 9.8 9.4 9.6  Total Protein 6.5 - 8.1 g/dL 7.3 - -  Total Bilirubin 0.3 - 1.2 mg/dL 0.4 - -  Alkaline Phos 38 - 126 U/L 73 - -  AST 15 - 41 U/L 28 - -  ALT 0 - 44 U/L 32 - -    Lab Results  Component Value Date   WBC 6.5 10/03/2020   HGB 12.3 (L) 10/03/2020   HCT 36.5 (L) 10/03/2020   MCV 100.0 10/03/2020   PLT 247 10/03/2020   NEUTROABS 4.8 10/03/2020    ASSESSMENT & PLAN:  No problem-specific Assessment & Plan notes found for this encounter.  1. SCC of floor of mouth,  [id number] M0 stage IVa clinical stage  PET scan on 02/16/20 showed the tongue mass with lymph node metastases on the right floor of the mouth and cervical lymph nodes. CK7 and CD 117 are negative, p63 and CK 5/6 are positive, p16 is negative Completed 3Cycles ofcisplatin(started9/10/2019)with XRT  PET CT scan 08/28/2020: Increased tongue hypermetabolism SUV 15.2 more diffuse including posteriorly suggestive of progressive local disease.  Resolution of thoracic nodal hypermetabolism.  No evidence of extrathoracic metastases. Repeat biopsy showed Focal necroinflammatory debris. No carcinoma identified. No  squamous mucosa present for evaluation.   He is here to establish with medical oncology I have reviewed his records from the initiation of treatment. He has no residual effects from  chemotherapy. His tongue pain is out of control he says, he has been using 8-12 tabs of ibuprofen daily along with viscous lidocaine. He is hoping to get a refill of oxycodone for short term to manage the pain. I have explained to him that we will give him a prescription for 30 tabs, no refills, if he continues to need it long term, he will be referred to pain management.  2. Lymphedema of neck. Agree with the massages and the device prescribed to him. This may also help with tongue swelling  FU in July with Dr [name]. We will try to alternate FU with Dr [name], hence will see him in October. Future imaging per Dr [name]. He was encouraged to reach out to [place] with any new questions or concerns.  Total time spent: 45 mins including face to face time and time spent for planning, charting and coordination of care. I have independently reviewed most recent PET imaging.  Benay Pike, MD 10/03/2020

## 2020-10-03 NOTE — Discharge Instructions (Addendum)
Urgent needs - Interventional Radiology on call MD [phone]  Wound - May remove dressing and shower in 24 to 48 hours.  Keep site clean and dry.  Replace with bandaid as needed.  Do not submerge in tub or water until site healing well. If closed with glue, glue will flake off on its own.   PEG Tube Removal, Care After This sheet gives you information about how to care for yourself after your procedure. Your health care provider may also give you more specific instructions. If you have problems or questions, contact your health care provider. What can I expect after the procedure? After the procedure, it is common to have:  Mild discomfort at the opening in your skin where the tube was removed (tube removal site).  A small amount of drainage from the opening. Follow these instructions at home: Care of the tube removal site  Follow instructions from your health care provider about how to take care of your tube removal site. Make sure you: ? Wash your hands with soap and water for at least 20 seconds before and after you change your bandage (dressing). If soap and water are not available, use hand sanitizer. ? Change your dressing as told by your health care provider.  Check your tube removal site every day for signs of infection. Check for: ? Redness, swelling, or pain. ? Fluid or blood. ? Warmth. ? Pus or a bad smell.  Do not take baths, swim, or use a hot tub until your health care provider approves. Ask your health care provider if you may take showers. You may only be allowed to take sponge baths. Activity  Return to your normal activities as told by your health care provider. Ask your health care provider what activities are safe for you.  Do not lift anything that is heavier than 10 lb (4.5 kg), or the limit that you are told, until your health care provider says that it is safe.   General instructions  Take over-the-counter and prescription medicines only as told by your  health care provider.  Follow instructions from your health care provider about eating and drinking.  Keep all follow-up visits. This is important. Contact a health care provider if:  You have a fever.  You have pain in your abdomen.  You have redness, swelling, or pain around your tube removal site.  You have fluid or blood coming from your tube removal site.  Your tube removal site feels warm to the touch.  You have pus or a bad smell coming from your tube removal site.  You have nausea or vomiting. Get help right away if:  You have persistent bleeding from your tube removal site.  You have severe pain in your abdomen.  You are not able to eat or drink anything by mouth. Summary  Follow instructions from your health care provider about how to take care of your tube removal site.  Do not take baths, swim, or use a hot tub until your health care provider approves. Ask your health care provider if you may take showers. You may only be allowed to take sponge baths.  Check your tube removal site every day for signs of infection.  Return to your normal activities as told by your health care provider. This information is not intended to replace advice given to you by your health care provider. Make sure you discuss any questions you have with your health care provider. Document Revised: 12/02/2019 Document Reviewed: 12/02/2019 Elsevier Patient  Education  2021 [place] Removal, Care After This sheet gives you information about how to care for yourself after your procedure. Your health care provider may also give you more specific instructions. If you have problems or questions, contact your health care provider. What can I expect after the procedure? After the procedure, it is common to have:  Soreness or pain near your incision.  Some swelling or bruising near your incision. Follow these instructions at home: Medicines  Take over-the-counter and  prescription medicines only as told by your health care provider.  If you were prescribed an antibiotic medicine, take it as told by your health care provider. Do not stop taking the antibiotic even if you start to feel better. Bathing  Do not take baths, swim, or use a hot tub until your health care provider approves. Ask your health care provider if you can take showers. You may only be allowed to take sponge baths. Incision care  Follow instructions from your health care provider about how to take care of your incision. Make sure you: ? Wash your hands with soap and water before you change your bandage (dressing). If soap and water are not available, use hand sanitizer. ? Change your dressing as told by your health care provider. ? Keep your dressing dry. ? Leave stitches (sutures), skin glue, or adhesive strips in place. These skin closures may need to stay in place for 2 weeks or longer. If adhesive strip edges start to loosen and curl up, you may trim the loose edges. Do not remove adhesive strips completely unless your health care provider tells you to do that.  Check your incision area every day for signs of infection. Check for: ? More redness, swelling, or pain. ? More fluid or blood. ? Warmth. ? Pus or a bad smell.   Driving  Do not drive for 24 hours if you were given a medicine to help you relax (sedative) during your procedure.  If you did not receive a sedative, ask your health care provider when it is safe to drive.   Activity  Return to your normal activities as told by your health care provider. Ask your health care provider what activities are safe for you.  Do not lift anything that is heavier than 10 lb (4.5 kg), or the limit that you are told, until your health care provider says that it is safe.  Do not do activities that involve lifting your arms over your head. General instructions  Do not use any products that contain nicotine or tobacco, such as cigarettes  and e-cigarettes. These can delay healing. If you need help quitting, ask your health care provider.  Keep all follow-up visits as told by your health care provider. This is important. Contact a health care provider if:  You have more redness, swelling, or pain around your incision.  You have more fluid or blood coming from your incision.  Your incision feels warm to the touch.  You have pus or a bad smell coming from your incision.  You have pain that is not relieved by your pain medicine. Get help right away if you have:  A fever or chills.  Chest pain.  Difficulty breathing. Summary  After the procedure, it is common to have pain, soreness, swelling, or bruising near your incision.  If you were prescribed an antibiotic medicine, take it as told by your health care provider. Do not stop taking the antibiotic even if you start to feel  better.  Do not drive for 24 hours if you were given a sedative during your procedure.  Return to your normal activities as told by your health care provider. Ask your health care provider what activities are safe for you. This information is not intended to replace advice given to you by your health care provider. Make sure you discuss any questions you have with your health care provider. Document Revised: 09/03/2017 Document Reviewed: 09/03/2017 Elsevier Patient Education  2021 [place]. Moderate Conscious Sedation, Adult, Care After This sheet gives you information about how to care for yourself after your procedure. Your health care provider may also give you more specific instructions. If you have problems or questions, contact your health care provider. What can I expect after the procedure? After the procedure, it is common to have:  Sleepiness for several hours.  Impaired judgment for several hours.  Difficulty with balance.  Vomiting if you eat too soon. Follow these instructions at home: For the time period you were told by  your health care provider:  Rest.  Do not participate in activities where you could fall or become injured.  Do not drive or use machinery.  Do not drink alcohol.  Do not take sleeping pills or medicines that cause drowsiness.  Do not make important decisions or sign legal documents.  Do not take care of children on your own.      Eating and drinking  Follow the diet recommended by your health care provider.  Drink enough fluid to keep your urine pale yellow.  If you vomit: ? Drink water, juice, or soup when you can drink without vomiting. ? Make sure you have little or no nausea before eating solid foods.   General instructions  Take over-the-counter and prescription medicines only as told by your health care provider.  Have a responsible adult stay with you for the time you are told. It is important to have someone help care for you until you are awake and alert.  Do not smoke.  Keep all follow-up visits as told by your health care provider. This is important. Contact a health care provider if:  You are still sleepy or having trouble with balance after 24 hours.  You feel light-headed.  You keep feeling nauseous or you keep vomiting.  You develop a rash.  You have a fever.  You have redness or swelling around the IV site. Get help right away if:  You have trouble breathing.  You have new-onset confusion at home. Summary  After the procedure, it is common to feel sleepy, have impaired judgment, or feel nauseous if you eat too soon.  Rest after you get home. Know the things you should not do after the procedure.  Follow the diet recommended by your health care provider and drink enough fluid to keep your urine pale yellow.  Get help right away if you have trouble breathing or new-onset confusion at home. This information is not intended to replace advice given to you by your health care provider. Make sure you discuss any questions you have with your  health care provider. Document Revised: 11/18/2019 Document Reviewed: 06/16/2019 Elsevier Patient Education  2021 [employer].

## 2020-10-03 NOTE — H&P (Signed)
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  He underwent placement of a gastrostomy tube and tunneled catheter with port by Dr. Annamaria Boots on April 03, 2020.  He has completed chemotherapy and is here today for removal of his port and his gastrostomy tube.  He confirms he is able to eat and keep his weight up.  He is n.p.o. No nausea/vomiting. No Fever/chills. ROS negative.   Past Medical History:  Diagnosis Date  . Cancer ([place])   . Depression     Past Surgical History:  Procedure Laterality Date  . EXCISION OF TONGUE LESION Left 09/17/2020   Procedure: Biopsy of Anterior Tongue;  Surgeon: [name], MD;  Location: [facility];  Service: ENT;  Laterality: Left;  . IR GASTROSTOMY TUBE MOD SED  04/03/2020  . IR IMAGING GUIDED PORT INSERTION  04/03/2020    Allergies: Patient has no known allergies.  Medications: Prior to Admission medications   Medication Sig Start Date End Date Taking? Authorizing Provider  dexamethasone (DECADRON) 1 MG tablet Take 1 tablet (1 mg total) by mouth every other day. 08/28/20   Nicholas Lose, MD  docusate (COLACE) 50 MG/5ML liquid Take by mouth daily.    [provider]  fluconazole (DIFLUCAN) 100 MG tablet Take 1 tablet (100 mg total) by mouth daily. 06/15/20   Nicholas Lose, MD  ibuprofen (ADVIL) 200 MG tablet Take 600 mg by mouth every 6 (six) hours as needed for moderate pain.    [provider]  Multiple Vitamin (MULTIVITAMIN) tablet Take 1 tablet by mouth daily. Patient not taking: Reported on 10/01/2020    [provider]  nicotine (NICODERM CQ) 7 mg/24hr patch Place 1 patch (7 mg total) onto the skin daily. 08/28/20   Nicholas Lose, MD   zolpidem (AMBIEN) 5 MG tablet Take 1 tablet (5 mg total) by mouth at bedtime as needed for sleep. 07/09/20   Nicholas Lose, MD     History reviewed. No pertinent family history.  Social History   Socioeconomic History  . Marital status: Single    Spouse name: Not on file  . Number of children: Not on file  . Years of education: Not on file  . Highest education level: Not on file  Occupational History  . Not on file  Tobacco Use  . Smoking status: Current Every Day Smoker    Packs/day: 0.50    Types: Cigarettes  . Smokeless tobacco: Never Used  Vaping Use  . Vaping Use: Never used  Substance and Sexual Activity  . Alcohol use: Yes    Alcohol/week: 3.0 standard drinks    Types: 3 Cans of beer per week    Comment: several beers "numbs my tongue"  . Drug use: Never  . Sexual activity: Not Currently  Other Topics Concern  . Not on file  Social History Narrative  . Not on file   Social Determinants of Health   Financial Resource Strain: Not on file  Food Insecurity: Not on file  Transportation Needs: Not on file  Physical Activity: Not on file  Stress: Not on file  Social Connections: Not on file     Review of Systems: A 12 point ROS discussed and pertinent positives are indicated in  the HPI above.  All other systems are negative.  Review of Systems  Vital Signs: BP (!) 150/96   Pulse 73   Temp 98 F (36.7 C) (Oral)   Resp 16   SpO2 100%   Physical Exam Vitals reviewed.  Constitutional:      Appearance: Normal appearance.  HENT:     Head: Normocephalic and atraumatic.  Eyes:     Extraocular Movements: Extraocular movements intact.  Cardiovascular:     Rate and Rhythm: Normal rate and regular rhythm.  Pulmonary:     Effort: Pulmonary effort is normal. No respiratory distress.     Breath sounds: Normal breath sounds.  Abdominal:     General: There is no distension.     Palpations: Abdomen is soft.     Tenderness: There is no abdominal tenderness.   Musculoskeletal:        General: Normal range of motion.     Cervical back: Normal range of motion.  Skin:    General: Skin is warm and dry.  Neurological:     General: No focal deficit present.     Mental Status: He is alert and oriented to person, place, and time.  Psychiatric:        Mood and Affect: Mood normal.        Behavior: Behavior normal.        Thought Content: Thought content normal.        Judgment: Judgment normal.     Imaging: No results found.  Labs:  CBC: Recent Labs    05/31/20 0932 06/08/20 1607 06/15/20 0927 10/03/20 1000  WBC 3.6* 3.7* 3.4* 6.5  HGB 10.4* 10.7* 10.6* 12.3*  HCT 29.3* 31.4* 31.0* 36.5*  PLT 254 235 275 247    COAGS: Recent Labs    04/03/20 0816  INR 0.9    BMP: Recent Labs    04/19/20 0753 04/23/20 1039 04/26/20 0815 05/03/20 0940 05/10/20 0913 05/24/20 0847 05/31/20 0932 06/08/20 1607 06/15/20 0927  NA 133* 135 133* 132*   < > 135 134* 136 136  K 4.3 4.8 4.1 4.0   < > 4.4 4.4 4.7 4.5  CL 97* 98 97* 97*   < > 98 99 96* 98  CO2 25 30 31 27    < > 28 27 28 30   GLUCOSE 120* 113* 127* 75   < > 159* 197* 102* 137*  BUN 12 13 12 18    < > 21* 20 18 16   CALCIUM 9.4 10.1 9.5 9.3   < > 9.2 9.6 9.4 9.8  CREATININE 0.84 0.92 0.84 0.90   < > 1.09 1.07 0.99 1.16  GFRNONAA >60 >60 >60 >60   < > >60 >60 >60 >60  GFRAA >60 >60 >60 >60  --   --   --   --   --    < > = values in this interval not displayed.    LIVER FUNCTION TESTS: Recent Labs    04/03/20 0816 06/15/20 0927  BILITOT 1.0 0.4  AST 33 28  ALT 25 32  ALKPHOS 65 73  PROT 8.1 7.3  ALBUMIN 4.6 3.8    TUMOR MARKERS: No results for input(s): AFPTM, CEA, CA199, CHROMGRNA in the last 8760 hours.  Assessment and Plan:  Completed chemotherapy for squamous cell cancer of the mouth.  Tolerating regular diet without weight loss.  We will proceed with removal of his tunneled catheter with port and his gastrostomy tube today by Dr. Serafina Royals.  Risks and  benefits  of tunneled catheter and gastrostomy tube removal were discussed with the patient including bleeding, infection, damage to adjacent structures, malfunction of the catheter with need for additional procedures.  All of the patient's questions were answered, patient is agreeable to proceed. Consent signed and in chart.  Thank you for this interesting consult.  I greatly enjoyed meeting Helaman Mecca Garciaperez and look forward to participating in their care.  A copy of this report was sent to the requesting provider on this date.  Electronically Signed: Murrell Redden, PA-C   10/03/2020, 11:08 AM      I spent a total of   15 Minutes in face to face in clinical consultation, greater than 50% of which was counseling/coordinating care for removal of tunneled catheter with port and gastrostomy tube removal.

## 2020-10-04 NOTE — Progress Notes (Signed)
Oncology Nurse Navigator Documentation  I joined [name] during his first appointment with Dr. Chryl Heck who is taking over patient's care from Dr. Lindi Adie. [name] is feeling well but continues to complain of pain to his tongue which was addressed by Dr. Chryl Heck. He had his PEG and PAC removed today and was advised to eat enough calories to maintain/gain weight by Dr. Chryl Heck. [name] knows how to contact me if he has any questions or concerns.   [name] RN, BSN, OCN Head & Neck Oncology Nurse [place] at [facility] Phone # [phone]  Fax # [phone]

## 2020-10-05 ENCOUNTER — Ambulatory Visit (HOSPITAL_COMMUNITY): Payer: Medicaid Other

## 2020-10-05 ENCOUNTER — Other Ambulatory Visit (HOSPITAL_COMMUNITY): Payer: Medicaid Other

## 2020-10-05 MED FILL — DEXAMETHASONE 1 MG TABLET: 1 | 60 days supply | Qty: 30 | Fill #0

## 2020-10-08 DIAGNOSIS — I890 Lymphedema, not elsewhere classified: Secondary | ICD-10-CM | POA: Diagnosis not present

## 2020-10-09 ENCOUNTER — Ambulatory Visit: Payer: Medicaid Other | Attending: Radiation Oncology

## 2020-10-09 ENCOUNTER — Other Ambulatory Visit: Payer: Self-pay

## 2020-10-09 DIAGNOSIS — R1312 Dysphagia, oropharyngeal phase: Secondary | ICD-10-CM | POA: Diagnosis not present

## 2020-10-09 DIAGNOSIS — C049 Malignant neoplasm of floor of mouth, unspecified: Secondary | ICD-10-CM

## 2020-10-09 DIAGNOSIS — R293 Abnormal posture: Secondary | ICD-10-CM | POA: Diagnosis not present

## 2020-10-09 DIAGNOSIS — C023 Malignant neoplasm of anterior two-thirds of tongue, part unspecified: Secondary | ICD-10-CM

## 2020-10-09 DIAGNOSIS — I890 Lymphedema, not elsewhere classified: Secondary | ICD-10-CM | POA: Diagnosis not present

## 2020-10-09 MED FILL — LIDOCAINE 2% VISCOUS SOLN: 2 | 3 days supply | Qty: 200 | Fill #3

## 2020-10-09 NOTE — Patient Instructions (Signed)
Manual lymph drainage for the neck, anterior approach  Sit in front of a mirror. Do 5 slow deep breaths, breathing in through the nose and out through the mouth, letting your belly "inflate" as you breathe in.  Rest your hands on your abdomen as you do this to give slight pressure there.  1) Place hands on areas just behind collar bones and do 10 stationary circles with stretch in outward directions. 2) Do stationary circles at each armpit about 10 times. 3) Place one hand on the front of the opposite shoulder and do stationary circles with stretch downward toward underarm. 4) Repeat #1 above. 5) Imagine a river running in a line from the earlobe straight down the neck.  Place hands just behind this and do circles with stretch coming forward slightly and down, thinking about fluid flowing down that river.  Do 10 times. 6) Place one hand just in front of the river on one side and do circles with a slight back and then downward stretch, thinking again about putting that fluid in the river.  Do 10-20 times on each side. 7) Place one hand just slightly in front of the spot you just did and do the same thing.  DO THIS VERY GENTLY. 8) Use the webspace between your thumb and index finger to pump downward starting just under the chin and "stair stepping" downward with a stretch, working down to the chest. 9) Repeat #1. 10) Do stationary circles on each side of the face just above the chin, out and down with the stretch 10 times. 11) Do stationary circles on each side of the face on the cheeks with pressure going back and down, 10 times. 12) Do stationary circles on each side of the face between the eyes and ears, again back and downward 10 times. 13) Repeat steps 9,8,7,6,5,1,3 and 2 in that order!  Do not slide on the skin, but STRETCH it with your motions. Only give enough pressure to stretch the skin. DO THIS SLOWLY, PLEASE!  And do once a day.  Pt also given photos of head and neck with proper  direction of stretch.  Cancer Rehab [phone]

## 2020-10-09 NOTE — Therapy (Signed)
[place], [place], [address] Phone: [phone]   Fax:  [phone]  Physical Therapy Treatment  Patient Details  Name: Casey [name] MRN: [id number] Date of Birth: 07/20/1966 Referring Provider (PT): Reita May Date: 10/09/2020   PT End of Session - 10/09/20 1839    Visit Number 3    Number of Visits 12    Authorization Type wellcare    Authorization Time Period 3/8 to 12/08/2020    Authorization - Visit Number 1    Authorization - Number of Visits 12    Progress Note Due on Visit 12    PT Start Time 1400    PT Stop Time 1503    PT Time Calculation (min) 63 min    Activity Tolerance Patient tolerated treatment well    Behavior During Therapy [facility] for tasks assessed/performed           Past Medical History:  Diagnosis Date  . Cancer ([place])   . Depression     Past Surgical History:  Procedure Laterality Date  . EXCISION OF TONGUE LESION Left 09/17/2020   Procedure: Biopsy of Anterior Tongue;  Surgeon: [name], MD;  Location: [facility];  Service: ENT;  Laterality: Left;  . IR GASTROSTOMY TUBE MOD SED  04/03/2020  . IR GASTROSTOMY TUBE REMOVAL  10/03/2020  . IR IMAGING GUIDED PORT INSERTION  04/03/2020  . IR REMOVAL TUN ACCESS W/ PORT W/O FL MOD SED  10/03/2020    There were no vitals filed for this visit.   Subjective Assessment - 10/09/20 1406    Subjective The chip pack does help to decrease the swelling.  I have been wearing it for several hours at a time.  In the day time when I am moving around the swelling is better. Flexitouch covered 100%. They are tosend it to me so I receive it tomorrow. and then they will come out here to set it up.  I have been doing the cervical  exercises and feels like ROM is improving.  Mouth is still bothering me.  I usually take 1 oxy a day.  My tongue is still really swollen and hard to talk. Had the Peg and Port a cath out last  Wednesday.    Pertinent History squamous cell carcinoma floor of mouth, tongue biopsy in May 2021,  Had  concurrent radiation to tongue and bilateral neck and chemo.  Had a PEG that will be removed on Wednesday.    Patient Stated Goals to reduce swelling    Currently in Pain? Yes    Pain Score 2    just took oxy   Pain Location Mouth    Pain Descriptors / Indicators Burning    Pain Type Acute pain    Pain Onset More than a month ago    Pain Frequency Intermittent    Multiple Pain Sites No                             [facility] Adult PT Treatment/Exercise - 10/09/20 0001      Manual Therapy   Edema Management Pt is wearing chip pack at home for several hours at a time and notes improvements in neck swelling.  Pt was also shown several garment options.  We decided together to have him measured by Melissa on 10/20/19 provided his insurance will cover.  He is awaiting a Cytogeneticist.  Manual Lymphatic Drainage (MLD) MLD performed by PT: 5 diaphragmatic breaths, short neck, bilateral axillary nodes, front of chest to axilla region bilaterally. posterior neck into the river, then lateral neck into the river, and front of neck with pump technique.  Repeated sequence twice with pt insupine, then had pt perform in sitting while observing PT                  PT Education - 10/09/20 1855    Education Details Pt was instructed in Cervical MLD to the anterior neck.  He required occasional verbal and tactile cues but did well for the first time doing.    Person(s) Educated Patient    Methods Explanation;Demonstration;Handout;Tactile cues;Verbal cues    Comprehension Verbalized understanding;Returned demonstration;Verbal cues required;Tactile cues required;Need further instruction               PT Long Term Goals - 10/01/20 2106      PT LONG TERM GOAL #1   Title pt will be independent in a HEP for cervical ROM and posture    Time 6    Period Weeks    Status New     Target Date 11/12/20      PT LONG TERM GOAL #2   Title pt will be independent in self Neck MLD to reduce edema    Time 6    Period Weeks    Status New    Target Date 11/12/20      PT LONG TERM GOAL #3   Title pt will have decreased neck edema by atleast 50%    Time 6    Period Weeks    Status New    Target Date 11/12/20      PT LONG TERM GOAL #4   Title Pt will be fit for appropriate compression garments to decrease lymphedema    Time 6    Period Weeks    Status New    Target Date 11/12/20      PT LONG TERM GOAL #5   Title Pt will be independent in self management of lymphedema    Time 6    Period Weeks    Status New    Target Date 11/12/20                 Plan - 10/09/20 1848    Clinical Impression Statement Pt was instructed in Self MLD to the cervical region today after it was performed by therapist on him.  Pt required occasional VC's and TC's but did reasonably well overall.  He was given illustrated and written instructions.  He should be receiving flexitouch in a few days and someone will come to set it up for him.  He was shown several different garment options including Marena Compression Chin strap with mid neck coverage and Head and Neck Jovi Pak.  Pt will come on March 18 to be fit by Lenna Sciara if his insurance covers.  Info to be faxed tomorrow    Personal Factors and Comorbidities Comorbidity 1;Comorbidity 2    Comorbidities DDD with narrowing of spine, Squamos Cell Carcinoma post radiation/chemo    Examination-Activity Limitations Reach Overhead    Stability/Clinical Decision Making Stable/Uncomplicated    Rehab Potential Good    PT Frequency 2x / week    PT Duration 6 weeks    PT Treatment/Interventions ADLs/Self Care Home Management;Therapeutic exercise;Patient/family education;Manual techniques;Manual lymph drainage;Vasopneumatic Device;Passive range of motion    PT Next Visit Plan cervical ROM, postural TB, MLD prn  PT Home Exercise Plan head and  neck ROM exercises           Patient will benefit from skilled therapeutic intervention in order to improve the following deficits and impairments:  Pain,Postural dysfunction,Decreased knowledge of precautions,Decreased range of motion,Increased edema  Visit Diagnosis: Abnormal posture  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) 03/07/2020    Claris Pong 10/09/2020, 7:01 PM  [place], [place], [address] Phone: [phone]   Fax:  [phone]  Name: Casey [name] MRN: [id number] Date of Birth: Jun 28, 1966  Cheral Almas, PT 10/09/20 7:03 PM

## 2020-10-15 ENCOUNTER — Other Ambulatory Visit: Payer: Self-pay

## 2020-10-15 ENCOUNTER — Ambulatory Visit: Payer: Medicaid Other

## 2020-10-15 DIAGNOSIS — C049 Malignant neoplasm of floor of mouth, unspecified: Secondary | ICD-10-CM

## 2020-10-15 NOTE — Progress Notes (Signed)
Oncology Nurse Navigator Documentation  Casey [name] contacted me via email about constant pain that he has been having over the past month to his tongue. He completed treatment for head and neck cancer on 05/23/20 and had a biopsy of his tongue on 2/14 for suspicious disease by Dr. [name] which did not demonstrate a recurrence of his cancer. He is taking dexamethasone, lidocaine rinses, and oxycodone without relief and is having difficulty eating. I have made Dr. Chryl Heck aware of this and she has placed a referral to a pain management clinic to help Mr. Winograd manage his pain. I have notified Mr. Warehime of the referral and I will follow up with him regarding having an appointment scheduled.   [name] RN, BSN, OCN Head & Neck Oncology Nurse [place] at [facility] Phone # [phone]  Fax # [phone]

## 2020-10-15 NOTE — Therapy (Signed)
[facility] [address] [place], [place], [address] Phone: [phone]   Fax:  [phone]  Patient Details  Name: Casey [name] MRN: [id number] Date of Birth: [birth year] Referring Provider:  Eppie Gibson, MD  Encounter Date: 10/15/2020    Speech Tx - Arrive/Cancel Note  Due to lingual pain 10/10 today appointment was rescheduled to 11-02-20 at 1400. Pt tearful today describing pain to SLP. Pt has been unable to swallow without intense pain. SLP encouraged him to focus on high-calorie, high protein intake. He has recently had PEG tube removed and does not wish re-insertion.   [facility] ,[place], [place]  10/15/2020, 2:55 PM  [facility] [address] [place], [place], [address] Phone: [phone]   Fax:  [phone]

## 2020-10-15 NOTE — Progress Notes (Signed)
[email or web address]

## 2020-10-16 ENCOUNTER — Other Ambulatory Visit (HOSPITAL_COMMUNITY): Payer: Self-pay | Admitting: Otolaryngology

## 2020-10-16 ENCOUNTER — Ambulatory Visit: Payer: Medicaid Other

## 2020-10-16 DIAGNOSIS — C049 Malignant neoplasm of floor of mouth, unspecified: Secondary | ICD-10-CM | POA: Diagnosis not present

## 2020-10-16 DIAGNOSIS — I890 Lymphedema, not elsewhere classified: Secondary | ICD-10-CM

## 2020-10-16 DIAGNOSIS — R1312 Dysphagia, oropharyngeal phase: Secondary | ICD-10-CM | POA: Diagnosis not present

## 2020-10-16 DIAGNOSIS — R293 Abnormal posture: Secondary | ICD-10-CM

## 2020-10-16 DIAGNOSIS — C023 Malignant neoplasm of anterior two-thirds of tongue, part unspecified: Secondary | ICD-10-CM | POA: Diagnosis not present

## 2020-10-16 NOTE — Therapy (Signed)
[place], [place], [address] Phone: [phone]   Fax:  [phone]  Physical Therapy Treatment  Patient Details  Name: Casey [name] MRN: [id number] Date of Birth: 05/12/1966 Referring Provider (PT): Reita May Date: 10/16/2020   PT End of Session - 10/16/20 1545    Visit Number 4    Number of Visits 12    Date for PT Re-Evaluation 11/12/20    Authorization Type wellcare    Authorization Time Period 3/8 to 12/08/2020    Authorization - Visit Number 2    Authorization - Number of Visits 12    PT Start Time 6440    PT Stop Time 1450    PT Time Calculation (min) 45 min    Activity Tolerance Patient tolerated treatment well    Behavior During Therapy [facility] for tasks assessed/performed           Past Medical History:  Diagnosis Date  . Cancer ([place])   . Depression     Past Surgical History:  Procedure Laterality Date  . EXCISION OF TONGUE LESION Left 09/17/2020   Procedure: Biopsy of Anterior Tongue;  Surgeon: [name], MD;  Location: [facility];  Service: ENT;  Laterality: Left;  . IR GASTROSTOMY TUBE MOD SED  04/03/2020  . IR GASTROSTOMY TUBE REMOVAL  10/03/2020  . IR IMAGING GUIDED PORT INSERTION  04/03/2020  . IR REMOVAL TUN ACCESS W/ PORT W/O FL MOD SED  10/03/2020    There were no vitals filed for this visit.   Subjective Assessment - 10/16/20 1404    Subjective I'm having an appt with MD on Thursday because I am having so much pain in my tongue and floor of mouth. They are setting up my Flexitouch that afternoon as well. I am having trouble eating and I am losing weight.  I tried the lymph drainage and I think it went OK. Still using the chip pack.    Pertinent History squamous cell carcinoma floor of mouth, tongue biopsy in May 2021,  Had  concurrent radiation to tongue and bilateral neck and chemo.  Had a PEG that will be removed on Wednesday.    Patient  Stated Goals to reduce swelling    Pain Score 6     Pain Location Mouth    Pain Orientation Left    Pain Descriptors / Indicators Burning;Sore    Pain Type Acute pain    Pain Onset More than a month ago    Pain Frequency Constant    Aggravating Factors  wearing dentures                 LYMPHEDEMA/ONCOLOGY QUESTIONNAIRE - 10/16/20 0001      Head and Neck   4 cm superior to sternal notch around neck 40 cm    6 cm superior to sternal notch around neck 40 cm    8 cm superior to sternal notch around neck 41.5 cm    Other tragus to tragus   23.5                     [facility] Adult PT Treatment/Exercise - 10/16/20 0001      Shoulder Exercises: Standing   Extension Strengthening;Both;10 reps    Theraband Level (Shoulder Extension) Level 1 (Yellow)    Retraction Strengthening;Both;10 reps    Theraband Level (Shoulder Retraction) Level 1 (Yellow)      Manual Therapy   Manual Lymphatic Drainage (  MLD) MLD performed by PT: 5 diaphragmatic breaths, short neck, bilateral axillary nodes, front of chest to axilla region bilaterally. posterior neck into the river, then lateral neck into the river, and front of neck with pump technique.  Repeated sequence twice with pt insupine,                  PT Education - 10/16/20 1544    Education Details Pt was educated in postural exercises including standing scapular retraction and standing shoulder ext with pt in good posture x 10 with yellow band    Person(s) Educated Patient    Methods Explanation;Demonstration;Handout    Comprehension Verbalized understanding;Returned demonstration               PT Long Term Goals - 10/01/20 2106      PT LONG TERM GOAL #1   Title pt will be independent in a HEP for cervical ROM and posture    Time 6    Period Weeks    Status New    Target Date 11/12/20      PT LONG TERM GOAL #2   Title pt will be independent in self Neck MLD to reduce edema    Time 6    Period Weeks     Status New    Target Date 11/12/20      PT LONG TERM GOAL #3   Title pt will have decreased neck edema by atleast 50%    Time 6    Period Weeks    Status New    Target Date 11/12/20      PT LONG TERM GOAL #4   Title Pt will be fit for appropriate compression garments to decrease lymphedema    Time 6    Period Weeks    Status New    Target Date 11/12/20      PT LONG TERM GOAL #5   Title Pt will be independent in self management of lymphedema    Time 6    Period Weeks    Status New    Target Date 11/12/20                 Plan - 10/16/20 1546    Clinical Impression Statement Pt continues to have unrelenting tongue and mouth pain that is making eating difficult/  He is to see MD on Thursday and also get some more pain meds.  He was measured today and circumference measures were all improved.  He has been compliant with chip pack and he has done some MLD at home.  MLD was performed by therapist today and pt was instructed in several postural exercises that he was able to do without increased pain.  Flexi touch will be set up for him at home on thursday afternoon so we will cancel his appt here.    Personal Factors and Comorbidities Comorbidity 1;Comorbidity 2    Examination-Activity Limitations Reach Overhead    Stability/Clinical Decision Making Stable/Uncomplicated    Rehab Potential Good    PT Frequency 2x / week    PT Duration 6 weeks    PT Treatment/Interventions ADLs/Self Care Home Management;Therapeutic exercise;Patient/family education;Manual techniques;Manual lymph drainage;Vasopneumatic Device;Passive range of motion    PT Next Visit Plan cervical ROM, postural TB, MLD prn    PT Home Exercise Plan head and neck ROM exercises, postural theraband:scap retraction/shoulder extension with yellow TB    Recommended Other Services being measured 10:30 Friday for garment    Consulted and Agree with Plan  of Care Patient           Patient will benefit from skilled  therapeutic intervention in order to improve the following deficits and impairments:  Pain,Postural dysfunction,Decreased knowledge of precautions,Decreased range of motion,Increased edema  Visit Diagnosis: Abnormal posture  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

## 2020-10-16 NOTE — Patient Instructions (Signed)
Access Code: [facility] URL: [email or web address] Date: 10/16/2020 Prepared by: Cheral Almas  Exercises Scapular Retraction with Resistance - 1 x daily - 3 x weekly - 1 sets - 10 reps Scapular Retraction with Resistance Advanced - 1 x daily - 1 sets - 10 reps

## 2020-10-18 ENCOUNTER — Other Ambulatory Visit (HOSPITAL_COMMUNITY): Payer: Self-pay | Admitting: Otolaryngology

## 2020-10-19 ENCOUNTER — Other Ambulatory Visit: Payer: Self-pay | Admitting: Radiation Oncology

## 2020-10-19 ENCOUNTER — Other Ambulatory Visit: Payer: Self-pay

## 2020-10-19 DIAGNOSIS — C049 Malignant neoplasm of floor of mouth, unspecified: Secondary | ICD-10-CM

## 2020-10-19 MED ORDER — LIDOCAINE VISCOUS HCL 2 % MT SOLN: OROMUCOSAL | 4 refills | Status: DC

## 2020-10-23 ENCOUNTER — Other Ambulatory Visit: Payer: Self-pay

## 2020-10-23 ENCOUNTER — Ambulatory Visit: Payer: Medicaid Other

## 2020-10-23 DIAGNOSIS — C023 Malignant neoplasm of anterior two-thirds of tongue, part unspecified: Secondary | ICD-10-CM

## 2020-10-23 DIAGNOSIS — R293 Abnormal posture: Secondary | ICD-10-CM | POA: Diagnosis not present

## 2020-10-23 DIAGNOSIS — I890 Lymphedema, not elsewhere classified: Secondary | ICD-10-CM | POA: Diagnosis not present

## 2020-10-23 DIAGNOSIS — R1312 Dysphagia, oropharyngeal phase: Secondary | ICD-10-CM | POA: Diagnosis not present

## 2020-10-23 DIAGNOSIS — C049 Malignant neoplasm of floor of mouth, unspecified: Secondary | ICD-10-CM

## 2020-10-23 NOTE — Therapy (Signed)
[place], [place], [address] Phone: [phone]   Fax:  [phone]  Physical Therapy Treatment  Patient Details  Name: Casey [name] MRN: [id number] Date of Birth: 13-Feb-1966 Referring Provider (PT): Reita May Date: 10/23/2020   PT End of Session - 10/23/20 1447    Visit Number 5    Number of Visits 12    Date for PT Re-Evaluation 11/12/20    Authorization Type wellcare    Authorization - Visit Number 3    Authorization - Number of Visits 12    PT Start Time 5465    PT Stop Time 1445    PT Time Calculation (min) 42 min    Activity Tolerance Patient tolerated treatment well    Behavior During Therapy [facility] for tasks assessed/performed           Past Medical History:  Diagnosis Date  . Cancer ([place])   . Depression     Past Surgical History:  Procedure Laterality Date  . EXCISION OF TONGUE LESION Left 09/17/2020   Procedure: Biopsy of Anterior Tongue;  Surgeon: [name], MD;  Location: [place];  Service: ENT;  Laterality: Left;  . IR GASTROSTOMY TUBE MOD SED  04/03/2020  . IR GASTROSTOMY TUBE REMOVAL  10/03/2020  . IR IMAGING GUIDED PORT INSERTION  04/03/2020  . IR REMOVAL TUN ACCESS W/ PORT W/O FL MOD SED  10/03/2020    There were no vitals filed for this visit.   Subjective Assessment - 10/23/20 1401    Subjective My neck and tongue were so big this am but it has gone down some now. Dr. gave me lidocaine rinse and oxycodone for pain.Have used flexitouch for the last 4 days. I can tell a difference in swelling when I do it. Haven't worn the chip pack lately but will start doing again.    Pertinent History squamous cell carcinoma floor of mouth, tongue biopsy in May 2021,  Had  concurrent radiation to tongue and bilateral neck and chemo.  Had a PEG that will be removed on Wednesday.    Patient Stated Goals to reduce swelling    Currently in Pain? Yes    Pain  Score 4     Pain Location Mouth    Pain Orientation Left;Right;Lower    Pain Descriptors / Indicators Aching    Pain Type Acute pain    Pain Onset More than a month ago    Pain Frequency Constant    Multiple Pain Sites No                 LYMPHEDEMA/ONCOLOGY QUESTIONNAIRE - 10/23/20 0001      Head and Neck   4 cm superior to sternal notch around neck 40.5 cm    6 cm superior to sternal notch around neck 41.5 cm    8 cm superior to sternal notch around neck 41.5 cm    Other 23.5                      [facility] Adult PT Treatment/Exercise - 10/23/20 0001      Shoulder Exercises: Standing   Extension Strengthening;Both;10 reps    Theraband Level (Shoulder Extension) Level 2 (Red)    Retraction Strengthening;Both;10 reps    Theraband Level (Shoulder Retraction) Level 2 (Red)    Other Standing Exercises supine ER red x 10      Manual Therapy   Manual Lymphatic Drainage (MLD)  MLD performed by PT: 5 diaphragmatic breaths, short neck, bilateral axillary nodes, front of chest to axilla region bilaterally. posterior neck into the river, then lateral neck into the river, and front of neck with pump technique.  Repeated sequence twice with pt insupine with table elevated slightly                       PT Long Term Goals - 10/01/20 2106      PT LONG TERM GOAL #1   Title pt will be independent in a HEP for cervical ROM and posture    Time 6    Period Weeks    Status New    Target Date 11/12/20      PT LONG TERM GOAL #2   Title pt will be independent in self Neck MLD to reduce edema    Time 6    Period Weeks    Status New    Target Date 11/12/20      PT LONG TERM GOAL #3   Title pt will have decreased neck edema by atleast 50%    Time 6    Period Weeks    Status New    Target Date 11/12/20      PT LONG TERM GOAL #4   Title Pt will be fit for appropriate compression garments to decrease lymphedema    Time 6    Period Weeks    Status New     Target Date 11/12/20      PT LONG TERM GOAL #5   Title Pt will be independent in self management of lymphedema    Time 6    Period Weeks    Status New    Target Date 11/12/20                 Plan - 10/23/20 1448    Clinical Impression Statement pt continues with pain and swelling in tongue and mouth but has been given some meds by MD.  He is compliant with Flexitouch and does feel that it helps his swelling.  He has not been using his chip pack as much and it was suggested that he increase his wear time with chip pack while at home as swelling is increased today.  He has also been advised to continue theraband exs for improving his posture and importance of being more aware of posture when he is sitting/standing to prevent further neck problems.  We will hold on therapy until next week while we wait for his garments to come in.    Personal Factors and Comorbidities Comorbidity 1;Comorbidity 2    Comorbidities DDD with narrowing of spine, Squamos Cell Carcinoma post radiation/chemo    Examination-Activity Limitations Reach Overhead    Stability/Clinical Decision Making Stable/Uncomplicated    Rehab Potential Good    PT Frequency 2x / week    PT Duration 6 weeks    PT Treatment/Interventions ADLs/Self Care Home Management;Therapeutic exercise;Patient/family education;Manual techniques;Manual lymph drainage;Vasopneumatic Device;Passive range of motion    PT Next Visit Plan check garments when they come in, review band exs prn, rremeasure.  Pt. 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

## 2020-10-24 ENCOUNTER — Encounter: Payer: Self-pay | Admitting: *Deleted

## 2020-10-24 NOTE — Progress Notes (Signed)
[place] Work  Holiday representative received phone call from patients sister requesting housing resources and options for patient.  Patient is currently living with his sister, but she will be moving and would like to help patient find best housing option.  Patient is currently on disability and would need affordable housing.  CSW encouraged patients sister to contact the housing coalition and housing authority.  CSW also left a voicemail for patients sister requesting a call back to share affordable housing list.  Johnnye Lana, MSW, LCSW, OSW-C Clinical Social Worker [place] [phone]

## 2020-10-25 ENCOUNTER — Other Ambulatory Visit (HOSPITAL_COMMUNITY): Payer: Self-pay | Admitting: Otolaryngology

## 2020-10-25 MED FILL — HYDROCOD-APAP 7.5-325/15ML: 7.5-325 | 7 days supply | Qty: 418 | Fill #0

## 2020-10-25 MED FILL — LIDOCAINE 2% VISCOUS SOLN: 2 | 10 days supply | Qty: 200 | Fill #1

## 2020-10-26 MED FILL — DEXAMETHASONE 1 MG TABLET: 1 | 60 days supply | Qty: 30 | Fill #1

## 2020-10-31 MED FILL — LIDOCAINE VISCOUS HCL 2 % S: 2 | 10 days supply | Qty: 200 | Fill #2

## 2020-11-02 ENCOUNTER — Ambulatory Visit: Payer: Medicaid Other | Attending: Radiation Oncology

## 2020-11-02 DIAGNOSIS — Z419 Encounter for procedure for purposes other than remedying health state, unspecified: Secondary | ICD-10-CM | POA: Diagnosis not present

## 2020-11-05 ENCOUNTER — Other Ambulatory Visit (HOSPITAL_COMMUNITY): Payer: Self-pay

## 2020-11-05 ENCOUNTER — Other Ambulatory Visit: Payer: Self-pay | Admitting: Radiation Oncology

## 2020-11-06 ENCOUNTER — Other Ambulatory Visit (HOSPITAL_COMMUNITY): Payer: Self-pay

## 2020-11-06 ENCOUNTER — Other Ambulatory Visit (HOSPITAL_COMMUNITY): Payer: Self-pay | Admitting: Otolaryngology

## 2020-11-06 MED FILL — Lidocaine HCl Viscous Soln 2%: OROMUCOSAL | 7 days supply | Qty: 200 | Fill #0 | Status: CN

## 2020-11-06 MED FILL — Lidocaine HCl Viscous Soln 2%: OROMUCOSAL | 7 days supply | Qty: 200 | Fill #0 | Status: AC

## 2020-11-08 ENCOUNTER — Other Ambulatory Visit (HOSPITAL_COMMUNITY): Payer: Self-pay

## 2020-11-08 MED ORDER — HYDROCODONE-ACETAMINOPHEN 7.5-325 MG/15ML PO SOLN
ORAL | 0 refills | Status: DC
  Filled 2020-11-08: qty 418, 5d supply, fill #0

## 2020-11-09 ENCOUNTER — Other Ambulatory Visit (HOSPITAL_COMMUNITY): Payer: Self-pay

## 2020-11-12 ENCOUNTER — Other Ambulatory Visit (HOSPITAL_COMMUNITY): Payer: Self-pay

## 2020-11-13 ENCOUNTER — Other Ambulatory Visit (HOSPITAL_COMMUNITY): Payer: Self-pay

## 2020-11-20 ENCOUNTER — Other Ambulatory Visit (HOSPITAL_COMMUNITY): Payer: Self-pay

## 2020-11-20 MED ORDER — HYDROCODONE-ACETAMINOPHEN 7.5-325 MG/15ML PO SOLN
ORAL | 0 refills | Status: DC
  Filled 2020-11-20: qty 418, 5d supply, fill #0

## 2020-12-02 DIAGNOSIS — Z419 Encounter for procedure for purposes other than remedying health state, unspecified: Secondary | ICD-10-CM | POA: Diagnosis not present

## 2020-12-05 ENCOUNTER — Telehealth: Payer: Self-pay

## 2020-12-05 NOTE — Telephone Encounter (Signed)
I attempted to reach Casey [name] to get him scheduled for a phone visit with the MM team. No one answered and there was not a voicemail. I will try to reach out again in the next 7-14 days.

## 2021-01-02 DIAGNOSIS — Z419 Encounter for procedure for purposes other than remedying health state, unspecified: Secondary | ICD-10-CM | POA: Diagnosis not present

## 2021-01-08 ENCOUNTER — Telehealth: Payer: Self-pay

## 2021-01-08 NOTE — Telephone Encounter (Signed)
.   Medicaid Managed Care   Unsuccessful Outreach Note  01/08/2021 Name: Casey [name] MRN: [id number] DOB: [birth year]  Referred by: Pcp, No Reason for referral : High Risk Managed Medicaid (Attempted to reach [name] today to get him scheduled with the [facility] Team. No one answered and there was not a VM.)   A second unsuccessful telephone outreach was attempted today. The patient was referred to the case management team for assistance with care management and care coordination.   Follow Up Plan: The care management team will reach out to the patient again over the next 7 days.   [place]

## 2021-02-01 DIAGNOSIS — Z419 Encounter for procedure for purposes other than remedying health state, unspecified: Secondary | ICD-10-CM | POA: Diagnosis not present

## 2021-02-20 ENCOUNTER — Telehealth: Payer: Self-pay

## 2021-02-20 NOTE — Telephone Encounter (Signed)
   Casey [name] DOB: [birth year] MRN: [id number]   RIDER WAIVER AND RELEASE OF LIABILITY  For purposes of improving physical access to our facilities, [facility] is pleased to partner with third parties to provide [place] patients or other authorized individuals the option of convenient, on-demand ground transportation services (the [place]") through use of the technology service that enables users to request on-demand ground transportation from independent third-party providers.  By opting to use and accept these [employer], I, the undersigned, hereby agree on behalf of myself, and on behalf of any minor child using the Government social research officer for whom I am the parent or legal guardian, as follows:  Government social research officer provided to me are provided by independent third-party transportation providers who are not [employer] or employees and who are unaffiliated with [employer]. [place] [facility] is neither a transportation carrier nor a common or public carrier. [facility] [facility] has no control over the quality or safety of the transportation that occurs as a result of the [employer]. [facility] cannot guarantee that any third-party transportation provider will complete any arranged transportation service. [facility] makes no representation, warranty, or guarantee regarding the reliability, timeliness, quality, safety, suitability, or availability of any of the Transport Services or that they will be error free. I fully understand that traveling by vehicle involves risks and dangers of serious bodily injury, including permanent disability, paralysis, and death. I agree, on behalf of myself and on behalf of any minor child using the Transport Services for whom I am the parent or legal guardian, that the entire risk arising out of my use of the [employer] remains solely with me, to the maximum extent permitted under applicable law. The [employer] are  provided "as is" and "as available." [facility] disclaims all representations and warranties, express, implied or statutory, not expressly set out in these terms, including the implied warranties of merchantability and fitness for a particular purpose. I hereby waive and release [facility], its agents, employees, officers, directors, representatives, insurers, attorneys, assigns, successors, subsidiaries, and affiliates from any and all past, present, or future claims, demands, liabilities, actions, causes of action, or suits of any kind directly or indirectly arising from acceptance and use of the [employer]. I further waive and release  and its affiliates from all present and future liability and responsibility for any injury or death to persons or damages to property caused by or related to the use of the [employer]. I have read this Waiver and Release of Liability, and I understand the terms used in it and their legal significance. This Waiver is freely and voluntarily given with the understanding that my right (as well as the right of any minor child for whom I am the parent or legal guardian using the [employer]) to legal recourse against  in connection with the [employer] is knowingly surrendered in return for use of these services.   I attest that I read the consent document to Lianne Bushy [place], gave Mr. Lafavor the opportunity to ask questions and answered the questions asked (if any). I affirm that Aurel Nguyen Woolen then provided consent for he's participation in this program.     Drucie Ip

## 2021-03-01 ENCOUNTER — Other Ambulatory Visit: Payer: Self-pay

## 2021-03-01 ENCOUNTER — Ambulatory Visit
Admission: RE | Admit: 2021-03-01 | Discharge: 2021-03-01 | Disposition: A | Discharge status: Home / Self Care | Payer: Medicaid Other | Source: Ambulatory Visit | Attending: Radiation Oncology | Admitting: Radiation Oncology

## 2021-03-01 VITALS — BP 122/92 | HR 66 | Temp 97.8°F | Resp 18 | Ht 63.0 in | Wt 112.6 lb

## 2021-03-01 DIAGNOSIS — R682 Dry mouth, unspecified: Secondary | ICD-10-CM | POA: Diagnosis not present

## 2021-03-01 DIAGNOSIS — C028 Malignant neoplasm of overlapping sites of tongue: Secondary | ICD-10-CM | POA: Diagnosis not present

## 2021-03-01 DIAGNOSIS — F17210 Nicotine dependence, cigarettes, uncomplicated: Secondary | ICD-10-CM | POA: Insufficient documentation

## 2021-03-01 DIAGNOSIS — R634 Abnormal weight loss: Secondary | ICD-10-CM | POA: Insufficient documentation

## 2021-03-01 DIAGNOSIS — Z923 Personal history of irradiation: Secondary | ICD-10-CM | POA: Insufficient documentation

## 2021-03-01 DIAGNOSIS — E0590 Thyrotoxicosis, unspecified without thyrotoxic crisis or storm: Secondary | ICD-10-CM | POA: Insufficient documentation

## 2021-03-01 DIAGNOSIS — C049 Malignant neoplasm of floor of mouth, unspecified: Secondary | ICD-10-CM

## 2021-03-01 DIAGNOSIS — C023 Malignant neoplasm of anterior two-thirds of tongue, part unspecified: Secondary | ICD-10-CM

## 2021-03-01 DIAGNOSIS — I890 Lymphedema, not elsewhere classified: Secondary | ICD-10-CM | POA: Diagnosis not present

## 2021-03-01 DIAGNOSIS — Z7952 Long term (current) use of systemic steroids: Secondary | ICD-10-CM | POA: Insufficient documentation

## 2021-03-01 DIAGNOSIS — H9201 Otalgia, right ear: Secondary | ICD-10-CM | POA: Diagnosis not present

## 2021-03-01 DIAGNOSIS — R946 Abnormal results of thyroid function studies: Secondary | ICD-10-CM | POA: Insufficient documentation

## 2021-03-01 DIAGNOSIS — R609 Edema, unspecified: Secondary | ICD-10-CM | POA: Diagnosis not present

## 2021-03-01 LAB — CBC WITH DIFFERENTIAL (CANCER CENTER ONLY)
Abs Immature Granulocytes: 0.06 10*3/uL (ref 0.00–0.07)
Basophils Absolute: 0.1 10*3/uL (ref 0.0–0.1)
Basophils Relative: 1 %
Eosinophils Absolute: 0.1 10*3/uL (ref 0.0–0.5)
Eosinophils Relative: 1 %
HCT: 41.1 % (ref 39.0–52.0)
Hemoglobin: 14.2 g/dL (ref 13.0–17.0)
Immature Granulocytes: 1 %
Lymphocytes Relative: 15 %
Lymphs Abs: 1 10*3/uL (ref 0.7–4.0)
MCH: 34.3 pg — ABNORMAL HIGH (ref 26.0–34.0)
MCHC: 34.5 g/dL (ref 30.0–36.0)
MCV: 99.3 fL (ref 80.0–100.0)
Monocytes Absolute: 0.8 10*3/uL (ref 0.1–1.0)
Monocytes Relative: 12 %
Neutro Abs: 4.8 10*3/uL (ref 1.7–7.7)
Neutrophils Relative %: 70 %
Platelet Count: 213 10*3/uL (ref 150–400)
RBC: 4.14 MIL/uL — ABNORMAL LOW (ref 4.22–5.81)
RDW: 14.4 % (ref 11.5–15.5)
WBC Count: 6.7 10*3/uL (ref 4.0–10.5)
nRBC: 0 % (ref 0.0–0.2)

## 2021-03-01 LAB — BUN & CREATININE (CHCC)
BUN: 9 mg/dL (ref 6–20)
Creatinine: 0.86 mg/dL (ref 0.61–1.24)
GFR, Estimated: >60 mL/min (ref >60)

## 2021-03-01 LAB — TSH: TSH: 27.696 u[IU]/mL — ABNORMAL HIGH (ref 0.320–4.118)

## 2021-03-01 MED ORDER — DEXAMETHASONE 1 MG PO TABS: ORAL_TABLET | ORAL | 1 refills | Status: DC

## 2021-03-01 MED ORDER — NICOTINE 7 MG/24HR TD PT24: 7.0000 mg | MEDICATED_PATCH | Freq: Every day | TRANSDERMAL | 2 refills | Status: DC

## 2021-03-01 NOTE — Progress Notes (Signed)
Oncology Nurse Navigator Documentation   I met with Casey [name] during his follow up with [name] today. He is coping well after completing his treatment for head and neck cancer. I've placed orders for labs today and a CT neck/chest to be completed in a few weeks. [name] plans to call him with the results. Casey [name] knows to call me if he has any questions.  [name] RN, BSN, OCN Head & Neck Oncology Nurse [place] [facility] Phone # [phone]  Fax # [phone]

## 2021-03-01 NOTE — Progress Notes (Signed)
Casey [name] presents today for follow-up after completing radiation to his tongue/floor of mouth on 05/23/2020  Pain issues, if any: Reports on-going headaches to his occipital most mornings when he first wakes up. Reports he manages with extra strength Tylenol. States occasionally they will reoccur later in the day, and he has to repeat Tylenol dose. Denies any nausea, balance concerns, or vision changes when headaches occur Using a feeding tube?: N/A--removed 10/03/2020 Weight changes, if any: Reports a fairly stable appetite, but states his mouth is still painful when eating Wt Readings from Last 3 Encounters:  03/01/21 112 lb 9.6 oz (51.1 kg)  10/03/20 120 lb 4.8 oz (54.6 kg)  09/17/20 123 lb 0.3 oz (55.8 kg)   Swallowing issues, if any: Denies any difficulty in his throat, but states that the linger mouth/tongue pain prevent him from eating easily. Reports he primarily eat softer foods or soups. Smoking or chewing tobacco? Continues to smoke occasionally--is still interested in quitting Using fluoride trays daily? N/A Last ENT visit was on: 10/18/2020 Saw [name]: "He continues to have significant pain. The pain medicine is helping some. He would like some viscous lidocaine as he was using before. On exam there is not much changed. There is severe edema of the tongue and lips and floor of mouth as always. There is lymphedema in the neck. In the anterior lingual sulcus there is collection of food debris that I was able to clean out with cotton-tipped applicator. I think this is preventing complete healing from the biopsy site. He is also losing weight. Recommend he increase his intake of fat and protein in any form that he is able to swallow such as smoothies or shakes. We will continue using pain medicine as needed. I will prescribe viscous lidocaine. I have recommended that he use a syringe to clean out the anteriorlingual gutter to prevent buildup of food. Follow-up as needed."  Other  notable issues, if any: Currently residing with his sister in [place], [place]. Denies any jaw pain, or difficulty opening his mouth. Reports occasional pain behind his right ear. States it usually lasts about an hour and then resolves on its own. Reports occasional dry mouth and has to keep a bottle of water to manage. Uses tactile vest from PT to help with lymphedema.   Vitals:   03/01/21 0957  BP: (!) 122/92  Pulse: 66  Resp: 18  Temp: 97.8 F (36.6 C)  SpO2: 100%

## 2021-03-02 ENCOUNTER — Other Ambulatory Visit (HOSPITAL_COMMUNITY): Payer: Self-pay

## 2021-03-03 ENCOUNTER — Other Ambulatory Visit: Payer: Self-pay | Admitting: Radiation Oncology

## 2021-03-03 ENCOUNTER — Encounter: Payer: Self-pay | Admitting: Radiation Oncology

## 2021-03-03 DIAGNOSIS — E038 Other specified hypothyroidism: Secondary | ICD-10-CM

## 2021-03-03 DIAGNOSIS — C049 Malignant neoplasm of floor of mouth, unspecified: Secondary | ICD-10-CM

## 2021-03-03 MED ORDER — LEVOTHYROXINE SODIUM 25 MCG PO TABS: ORAL_TABLET | ORAL | 1 refills | Status: DC

## 2021-03-03 NOTE — Progress Notes (Signed)
Radiation Oncology         [phone] ________________________________  Name: Casey [name] MRN: [id number]  Date: 03/01/2021  DOB: July 06, 1966  Follow-Up Visit Note  CC: Pcp, No  Nicholas Lose, MD  Diagnosis and Prior Radiotherapy:    C02.3   ICD-10-CM   1. 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 Reports he manages with extra strength Tylenol. States occasionally they will reoccur later in the day, and he has to repeat Tylenol dose. Denies any nausea, balance concerns, or vision changes when headaches occur Using a feeding tube?: N/A--removed 10/03/2020 Weight changes, if any: Reports a fairly stable appetite, but states his mouth is still painful when eating Wt Readings from Last 3 Encounters:  03/01/21 112 lb 9.6 oz (51.1 kg)  10/03/20 120 lb 4.8 oz (54.6 kg)  09/17/20 123 lb 0.3 oz (55.8 kg)   Swallowing issues, if any: Denies any difficulty in his throat, but states that the linger mouth/tongue pain prevent him from eating easily. Reports he primarily eat softer foods or soups. Smoking or chewing tobacco? Continues to smoke occasionally--is still interested in quitting Using fluoride trays daily? N/A Last  ENT visit was on: 10/18/2020 Saw [name]: "He continues to have significant pain. The pain medicine is helping some. He would like some viscous lidocaine as he was using before. On exam there is not much changed. There is severe edema of the tongue and lips and floor of mouth as always. There is lymphedema in the neck. In the anterior lingual sulcus there is collection of food debris that I was able to clean out with cotton-tipped applicator. I think this is preventing complete healing from the biopsy site. He is also losing weight. Recommend he increase his intake of fat and protein in any form that he is able to swallow such as smoothies or shakes. We will continue using pain medicine as needed. I will prescribe viscous lidocaine. I have recommended that he use a syringe to clean out the anteriorlingual gutter to prevent buildup of food. Follow-up as needed."  Other notable issues, if any: Currently residing with his sister in [place], [place]. Denies any jaw pain, or difficulty opening his mouth. Reports occasional pain behind his right ear. States it usually lasts about an hour and then resolves on its own. Reports occasional dry mouth and has to keep a bottle of water to manage. Uses tactile vest from PT to help with lymphedema.   Vitals:   03/01/21 0957  BP: (!) 122/92  Pulse: 66  Resp: 18  Temp: 97.8 F (36.6 C)  SpO2: 100%      Vitals:   03/01/21 0957  BP: (!) 122/92  Pulse: 66  Resp: 18  Temp: 97.8 F (36.6 C)  SpO2: 100%                       ALLERGIES:  has No Known Allergies.  Meds: Current Outpatient Medications  Medication Sig Dispense Refill   dexamethasone (DECADRON) 1 MG tablet Take no more than 1 tablet every other day, PRN mouth swelling. 30 tablet 1   docusate (COLACE) 50 MG/5ML liquid Take by mouth every other day.     ibuprofen (ADVIL) 200 MG tablet Take 800 mg by mouth every 6 (six) hours as needed for moderate pain.     lidocaine (XYLOCAINE) 2 % solution  MIX 1 PART 2% VISCOUS LIDOCAINE WITH 1 PART WATER. SWISH/SPIT 10ML OF DILUTED MIXTURE TO NUMB MOUTH, UP TO 6 TIMES DAILY AS NEEDED 200 mL 4   Multiple Vitamin (MULTIVITAMIN) tablet Take 1 tablet by mouth daily. (Patient not taking: No sig reported)     nicotine (NICODERM CQ) 7 mg/24hr patch Place 1 patch (7 mg total) onto the skin daily. 14 patch 2   zolpidem (AMBIEN) 5 MG tablet TAKE 1 TABLET BY MOUTH AT BEDTIME AS NEEDED FOR SLEEP. (Patient not taking: Reported on 10/03/2020) 30 tablet 1   No current facility-administered medications for this encounter.    Physical Findings: The patient is in no acute distress. Patient is alert and oriented. Wt Readings from Last 3 Encounters:  03/01/21 112 lb 9.6 oz (51.1 kg)  10/03/20 120 lb 4.8 oz (54.6 kg)  09/17/20 123 lb 0.3 oz (55.8 kg)    height is 5\' 3"  (1.6 m) and weight is 112 lb 9.6 oz (51.1 kg). His temperature is 97.8 F (36.6 C). His blood pressure is 122/92 (abnormal) and his pulse is 66. His respiration is 18 and oxygen saturation is 100%. .  General: Alert and oriented, in no acute distress HEENT: Head is normocephalic. Extraocular movements are intact. Oropharynx is notable for no lesions in the upper throat.   No mucosal lesions appreciated on the tongue, buccal mucosa, or floor of mouth.   Tongue remains swollen and firm.  Skin: Skin in treatment fields shows satisfactory healing  - intact. Neck: no masses appreciated. Lymphedema of face/neck present. CHEST CTAB HEART RRR EXT NO EDEMA PSYCH pleasant affect, alert and oriented Neuro: non focal  Lab Findings: Lab Results  Component Value Date   WBC 6.7 03/01/2021   HGB 14.2 03/01/2021   HCT 41.1 03/01/2021   MCV 99.3 03/01/2021   PLT 213 03/01/2021    Lab Results  Component Value Date   TSH 27.696 (H) 03/01/2021   CMP     Component Value Date/Time   NA 136 06/15/2020 0927   K 4.5 06/15/2020 0927   CL 98 06/15/2020 0927   CO2 30 06/15/2020 0927   GLUCOSE 137 (H)  06/15/2020 0927   BUN 9 03/01/2021 1119   CREATININE 0.86 03/01/2021 1119   CALCIUM 9.8 06/15/2020 0927   PROT 7.3 06/15/2020 0927   ALBUMIN 3.8 06/15/2020 0927   AST 28 06/15/2020 0927   ALT 32 06/15/2020 0927   ALKPHOS 73 06/15/2020 0927   BILITOT 0.4 06/15/2020 0927   GFRNONAA >60 03/01/2021 1119   GFRAA >60 05/03/2020 0940    Radiographic Findings: No results found.   Impression/Plan:    1) Head and Neck Cancer Status:  doing well overall but losing weight. We will obtain CT chest and neck in August to rule out distant metastases; most likely  weight loss is from suboptimal intake and patient will also try to work on this  2) Nutritional Status: as above - PEG removed. Wt Readings from Last 3 Encounters:  03/01/21 112 lb 9.6 oz (51.1 kg)  10/03/20 120 lb 4.8 oz (54.6 kg)  09/17/20 123 lb 0.3 oz (55.8 kg)    3) Risk Factors: The patient has been educated about risk factors including alcohol and tobacco abuse; they understand that avoidance of alcohol and tobacco is important to prevent recurrences as well as other cancers  Still smoking much less than 1/2 ppd. Nicotine patch Rx'd, urged to quit. He states he will try, but no quit date ascertained  4) Swallowing: Continue speech-language pathology exercises  5) Thyroid function: TSH elevated today.  I will start him on 25 mcg levothyroxine and we will recheck labs in 1-2 mo. Lab Results  Component Value Date   TSH 27.696 (H) 03/01/2021   6) I will see him back in Jan 2023 and call him with the CT results in Aug.  On date of service, in total, I spent 35 minutes on this encounter. Patient was seen in person. _____________________________________   Eppie Gibson, MD

## 2021-03-03 NOTE — Addendum Note (Signed)
Encounter addended by: Eppie Gibson, MD on: 03/03/2021 7:34 AM  Actions taken: Medication List reviewed, Problem List reviewed, Allergies reviewed, Clinical Note Signed, Level of Service modified

## 2021-03-04 ENCOUNTER — Telehealth: Payer: Self-pay | Admitting: *Deleted

## 2021-03-04 ENCOUNTER — Other Ambulatory Visit: Payer: Self-pay

## 2021-03-04 DIAGNOSIS — R5381 Other malaise: Secondary | ICD-10-CM

## 2021-03-04 DIAGNOSIS — Z419 Encounter for procedure for purposes other than remedying health state, unspecified: Secondary | ICD-10-CM | POA: Diagnosis not present

## 2021-03-04 DIAGNOSIS — C023 Malignant neoplasm of anterior two-thirds of tongue, part unspecified: Secondary | ICD-10-CM

## 2021-03-04 NOTE — Telephone Encounter (Signed)
CALLED PATIENT TO INFORM OF CT FOR 03-13-21- ARRIVAL TIME- 7:45 AM @ [facility], PATIENT TO HAVE WATER ONLY - 4 HRS. PRIOR TO TEST, LVM FOR A RETURN CALL

## 2021-03-12 ENCOUNTER — Other Ambulatory Visit: Payer: Self-pay | Admitting: Radiation Oncology

## 2021-03-12 DIAGNOSIS — C049 Malignant neoplasm of floor of mouth, unspecified: Secondary | ICD-10-CM

## 2021-03-12 MED ORDER — FLUCONAZOLE 100 MG PO TABS: 100.0000 mg | ORAL_TABLET | Freq: Every day | ORAL | 0 refills | Status: DC

## 2021-03-13 ENCOUNTER — Other Ambulatory Visit: Payer: Self-pay

## 2021-03-13 ENCOUNTER — Ambulatory Visit (HOSPITAL_COMMUNITY): Payer: Medicaid Other

## 2021-03-13 ENCOUNTER — Ambulatory Visit (HOSPITAL_COMMUNITY)
Admission: RE | Admit: 2021-03-13 | Discharge: 2021-03-13 | Disposition: A | Discharge status: Home / Self Care | Payer: Medicaid Other | Source: Ambulatory Visit | Attending: Radiation Oncology | Admitting: Radiation Oncology

## 2021-03-13 ENCOUNTER — Encounter (HOSPITAL_COMMUNITY): Payer: Self-pay

## 2021-03-13 DIAGNOSIS — J439 Emphysema, unspecified: Secondary | ICD-10-CM | POA: Diagnosis not present

## 2021-03-13 DIAGNOSIS — J392 Other diseases of pharynx: Secondary | ICD-10-CM | POA: Diagnosis not present

## 2021-03-13 DIAGNOSIS — R911 Solitary pulmonary nodule: Secondary | ICD-10-CM | POA: Diagnosis not present

## 2021-03-13 DIAGNOSIS — M47812 Spondylosis without myelopathy or radiculopathy, cervical region: Secondary | ICD-10-CM | POA: Diagnosis not present

## 2021-03-13 DIAGNOSIS — C023 Malignant neoplasm of anterior two-thirds of tongue, part unspecified: Secondary | ICD-10-CM | POA: Insufficient documentation

## 2021-03-13 DIAGNOSIS — J384 Edema of larynx: Secondary | ICD-10-CM | POA: Diagnosis not present

## 2021-03-13 DIAGNOSIS — R918 Other nonspecific abnormal finding of lung field: Secondary | ICD-10-CM | POA: Diagnosis not present

## 2021-03-13 DIAGNOSIS — C760 Malignant neoplasm of head, face and neck: Secondary | ICD-10-CM | POA: Diagnosis not present

## 2021-03-13 DIAGNOSIS — I700 Atherosclerosis of aorta: Secondary | ICD-10-CM | POA: Diagnosis not present

## 2021-03-13 MED ORDER — IOHEXOL 350 MG/ML SOLN
100.0000 mL | Freq: Once | INTRAVENOUS | Status: AC | PRN
  Administered 2021-03-13: 60 mL via INTRAVENOUS

## 2021-03-15 ENCOUNTER — Other Ambulatory Visit: Payer: Self-pay

## 2021-03-15 DIAGNOSIS — C049 Malignant neoplasm of floor of mouth, unspecified: Secondary | ICD-10-CM

## 2021-03-15 NOTE — Progress Notes (Signed)
Oncology Nurse Navigator Documentation   At Dr. Pearlie Oyster request I contacted Casey [name] to discuss his recent CT scans. [name] felt like they were reassuring but she would like to follow with another CT neck/chest in mid November to follow the non-specific findings. I informed Casey [name] of the above and he voiced his understanding. He knows that he will see [name] for results of his scans the next day. He knows to call me if he has any questions or concerns.   [name] RN, BSN, OCN Head & Neck Oncology Nurse [facility] Phone # [phone]  Fax # [phone]

## 2021-03-18 ENCOUNTER — Telehealth: Payer: Self-pay | Admitting: Hematology and Oncology

## 2021-03-18 NOTE — Telephone Encounter (Signed)
R/s appt per 8/15 sch msg. Pt aware.

## 2021-03-22 ENCOUNTER — Encounter: Payer: Self-pay | Admitting: Hematology and Oncology

## 2021-03-22 ENCOUNTER — Other Ambulatory Visit (HOSPITAL_COMMUNITY): Payer: Self-pay

## 2021-03-22 MED FILL — Lidocaine HCl Viscous Soln 2%: OROMUCOSAL | 7 days supply | Qty: 200 | Fill #1 | Status: AC

## 2021-04-04 DIAGNOSIS — Z419 Encounter for procedure for purposes other than remedying health state, unspecified: Secondary | ICD-10-CM | POA: Diagnosis not present

## 2021-04-24 DIAGNOSIS — H5213 Myopia, bilateral: Secondary | ICD-10-CM | POA: Diagnosis not present

## 2021-05-04 DIAGNOSIS — Z419 Encounter for procedure for purposes other than remedying health state, unspecified: Secondary | ICD-10-CM | POA: Diagnosis not present

## 2021-05-08 ENCOUNTER — Ambulatory Visit: Payer: Medicaid Other | Admitting: Hematology and Oncology

## 2021-05-08 ENCOUNTER — Ambulatory Visit: Payer: Medicaid Other

## 2021-05-31 ENCOUNTER — Other Ambulatory Visit: Payer: Self-pay | Admitting: Radiation Oncology

## 2021-05-31 DIAGNOSIS — C049 Malignant neoplasm of floor of mouth, unspecified: Secondary | ICD-10-CM

## 2021-05-31 MED ORDER — NICOTINE 14 MG/24HR TD PT24: 14.0000 mg | MEDICATED_PATCH | Freq: Every day | TRANSDERMAL | 1 refills | Status: DC

## 2021-06-04 DIAGNOSIS — Z419 Encounter for procedure for purposes other than remedying health state, unspecified: Secondary | ICD-10-CM | POA: Diagnosis not present

## 2021-06-17 ENCOUNTER — Ambulatory Visit
Admission: RE | Admit: 2021-06-17 | Discharge: 2021-06-17 | Disposition: A | Discharge status: Home / Self Care | Payer: Medicaid Other | Source: Ambulatory Visit | Attending: Radiation Oncology | Admitting: Radiation Oncology

## 2021-06-17 ENCOUNTER — Other Ambulatory Visit: Payer: Self-pay

## 2021-06-17 ENCOUNTER — Ambulatory Visit (HOSPITAL_COMMUNITY)
Admission: RE | Admit: 2021-06-17 | Discharge: 2021-06-17 | Disposition: A | Discharge status: Home / Self Care | Payer: Medicaid Other | Source: Ambulatory Visit | Attending: Radiation Oncology | Admitting: Radiation Oncology

## 2021-06-17 ENCOUNTER — Encounter (HOSPITAL_COMMUNITY): Payer: Self-pay

## 2021-06-17 DIAGNOSIS — I700 Atherosclerosis of aorta: Secondary | ICD-10-CM | POA: Diagnosis not present

## 2021-06-17 DIAGNOSIS — R5381 Other malaise: Secondary | ICD-10-CM | POA: Insufficient documentation

## 2021-06-17 DIAGNOSIS — R5383 Other fatigue: Secondary | ICD-10-CM | POA: Diagnosis not present

## 2021-06-17 DIAGNOSIS — C049 Malignant neoplasm of floor of mouth, unspecified: Secondary | ICD-10-CM

## 2021-06-17 DIAGNOSIS — I2510 Atherosclerotic heart disease of native coronary artery without angina pectoris: Secondary | ICD-10-CM | POA: Insufficient documentation

## 2021-06-17 DIAGNOSIS — M47812 Spondylosis without myelopathy or radiculopathy, cervical region: Secondary | ICD-10-CM | POA: Diagnosis not present

## 2021-06-17 DIAGNOSIS — J439 Emphysema, unspecified: Secondary | ICD-10-CM | POA: Insufficient documentation

## 2021-06-17 DIAGNOSIS — R911 Solitary pulmonary nodule: Secondary | ICD-10-CM | POA: Diagnosis not present

## 2021-06-17 DIAGNOSIS — R918 Other nonspecific abnormal finding of lung field: Secondary | ICD-10-CM | POA: Diagnosis not present

## 2021-06-17 DIAGNOSIS — C023 Malignant neoplasm of anterior two-thirds of tongue, part unspecified: Secondary | ICD-10-CM

## 2021-06-17 DIAGNOSIS — K110 Atrophy of salivary gland: Secondary | ICD-10-CM | POA: Diagnosis not present

## 2021-06-17 DIAGNOSIS — I6523 Occlusion and stenosis of bilateral carotid arteries: Secondary | ICD-10-CM | POA: Diagnosis not present

## 2021-06-17 LAB — BASIC METABOLIC PANEL - CANCER CENTER ONLY
Anion gap: 11 (ref 5–15)
BUN: 6 mg/dL (ref 6–20)
CO2: 27 mmol/L (ref 22–32)
Calcium: 9.5 mg/dL (ref 8.9–10.3)
Chloride: 96 mmol/L — ABNORMAL LOW (ref 98–111)
Creatinine: 0.82 mg/dL (ref 0.61–1.24)
GFR, Estimated: >60 mL/min (ref >60)
Glucose, Bld: 78 mg/dL (ref 70–99)
Potassium: 4.1 mmol/L (ref 3.5–5.1)
Sodium: 134 mmol/L — ABNORMAL LOW (ref 135–145)

## 2021-06-17 LAB — TSH: TSH: 33.497 u[IU]/mL — ABNORMAL HIGH (ref 0.320–4.118)

## 2021-06-17 MED ORDER — IOHEXOL 350 MG/ML SOLN
75.0000 mL | Freq: Once | INTRAVENOUS | Status: AC | PRN
  Administered 2021-06-17: 75 mL via INTRAVENOUS

## 2021-06-18 ENCOUNTER — Encounter: Payer: Self-pay | Admitting: Radiation Oncology

## 2021-06-18 ENCOUNTER — Telehealth: Payer: Self-pay | Admitting: *Deleted

## 2021-06-18 ENCOUNTER — Ambulatory Visit
Admission: RE | Admit: 2021-06-18 | Discharge: 2021-06-18 | Disposition: A | Discharge status: Home / Self Care | Payer: Medicaid Other | Source: Ambulatory Visit | Attending: Radiation Oncology | Admitting: Radiation Oncology

## 2021-06-18 VITALS — BP 147/86 | HR 77 | Temp 97.6°F | Resp 20 | Ht 63.0 in | Wt 122.0 lb

## 2021-06-18 DIAGNOSIS — R5381 Other malaise: Secondary | ICD-10-CM | POA: Diagnosis not present

## 2021-06-18 DIAGNOSIS — R5383 Other fatigue: Secondary | ICD-10-CM | POA: Diagnosis not present

## 2021-06-18 DIAGNOSIS — C023 Malignant neoplasm of anterior two-thirds of tongue, part unspecified: Secondary | ICD-10-CM

## 2021-06-18 DIAGNOSIS — C049 Malignant neoplasm of floor of mouth, unspecified: Secondary | ICD-10-CM

## 2021-06-18 NOTE — Telephone Encounter (Signed)
CALLED PATIENT TO REMIND OF LAB PRIOR TO FU APPT. TODAY @ 2 PM, LVM ON SISTER'S VM (ANNE), LVM FOR A RETURN CALL

## 2021-06-18 NOTE — Progress Notes (Signed)
Mr. Milne presents today for follow-up after completing radiation to his tongue/floor of mouth on 05/23/2020, and to review CT scan results from 06/17/2021  Pain issues, if any: Reports mouth and throat pain have greatly improved. Uses lidocaine very rarely now Using a feeding tube?: N/A Weight changes, if any: Reports a healthy appetite Wt Readings from Last 3 Encounters:  06/18/21 122 lb (55.3 kg)  03/01/21 112 lb 9.6 oz (51.1 kg)  10/03/20 120 lb 4.8 oz (54.6 kg)   Swallowing issues, if any: Unable to tolerate acidic foods or beverages, but otherwise reports he can eat and drink wide variety Smoking or chewing tobacco? Continues smoke occasionally, but is hoping to quit when he moves back to [place] in December Using fluoride trays daily? N/A Last ENT visit was on: Not since he saw Dr. [name] on 10/18/2020 Other notable issues, if any: Reports lymphedema has improved greatly and he only has to use the Flextouch vest a few times a week. Continues deal with random/intermitent pain behind his right ear (reports it will come on sudenly without warning, and then resolve on it's own). Continued to deal with dry mouth and occasional thick saliva. Reports he is only taking the dexamethasone occasionally if he wakes up and his mouth/upper throat feels swollen

## 2021-06-18 NOTE — Progress Notes (Signed)
Radiation Oncology         [phone] ________________________________  Name: Casey [name] MRN: [id number]  Date: 06/18/2021  DOB: Oct 18, 1965  Follow-Up Visit Note  CC: Pcp, No  Nicholas Lose, MD  Diagnosis and Prior Radiotherapy:    C02.3   ICD-10-CM   1. 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 ([place]) Staging form: Oral Cavity, AJCC 8th Edition - Clinical: Stage IVA (cT4a, cN2b, cM0) - Signed by Nicholas Lose, MD on 03/07/2020 Histologic grade (G): G3 Histologic grading system: 3 grade system   CHIEF COMPLAINT:  Here for follow-up and surveillance of mouth cancer  Narrative:   Casey [name] presents today for follow-up after completing radiation to his tongue/floor of mouth on 05/23/2020  Casey [name] presents today for follow-up after completing radiation to his tongue/floor of mouth on 05/23/2020, and to review CT scan results from 06/17/2021  Pain issues, if any: Reports mouth and throat pain have greatly improved. Uses lidocaine very rarely now Using a feeding tube?: N/A Weight changes, if any: Reports a healthy appetite Wt Readings from Last 3 Encounters:  06/18/21 122 lb (55.3 kg)  03/01/21 112 lb 9.6 oz (51.1 kg)  10/03/20 120 lb 4.8 oz (54.6 kg)   Swallowing issues, if any: Unable to tolerate acidic foods or beverages, but otherwise reports he can eat and drink wide variety Smoking or chewing tobacco? Continues smoke occasionally, but is hoping to quit when he moves back to [place] in December Using fluoride trays daily? N/A Last ENT visit was on: Not since he saw Dr. [name] on 10/18/2020 Other notable issues, if any: Reports lymphedema has improved greatly and he only has to use the Flextouch vest a few times a week. Continues deal with random/intermitent pain behind his right ear (reports it will come on  sudenly without warning, and then resolve on it's own). Continued to deal with dry mouth and occasional thick saliva. Reports he is only taking the dexamethasone occasionally if he wakes up and his mouth/upper throat feels swollen         ALLERGIES:  has No Known Allergies.  Meds: Current Outpatient Medications  Medication Sig Dispense Refill   dexamethasone (DECADRON) 1 MG tablet Take no more than 1 tablet every other day, PRN mouth swelling. 30 tablet 1   docusate (COLACE) 50 MG/5ML liquid Take by mouth every other day.     fluconazole (DIFLUCAN) 100 MG tablet Take 1 tablet (100 mg total) by mouth daily. 14 tablet 0   ibuprofen (ADVIL) 200 MG tablet Take 800 mg by mouth every 6 (six) hours as needed for moderate pain.     levothyroxine (SYNTHROID) 25 MCG tablet Take 1 tablet every morning on an empty stomach, 1 hour before coffee, other meds, or food. (Patient not taking: Reported on 06/18/2021) 90 tablet 1   lidocaine (XYLOCAINE) 2 % solution MIX 1 PART 2% VISCOUS LIDOCAINE WITH 1 PART WATER. SWISH/SPIT 10ML OF DILUTED MIXTURE TO NUMB MOUTH, UP TO 6 TIMES DAILY AS NEEDED 200 mL 4   Multiple Vitamin (MULTIVITAMIN) tablet Take 1 tablet by mouth daily. (Patient not taking: No sig reported)     nicotine (NICODERM CQ) 14 mg/24hr patch Place 1 patch (14 mg total) onto the skin daily. 28 patch 1   zolpidem (AMBIEN) 5 MG tablet TAKE 1  TABLET BY MOUTH AT BEDTIME AS NEEDED FOR SLEEP. (Patient not taking: Reported on 10/03/2020) 30 tablet 1   No current facility-administered medications for this encounter.    Physical Findings: The patient is in no acute distress. Patient is alert and oriented. Wt Readings from Last 3 Encounters:  06/18/21 122 lb (55.3 kg)  03/01/21 112 lb 9.6 oz (51.1 kg)  10/03/20 120 lb 4.8 oz (54.6 kg)    height is 5\' 3"  (1.6 m) and weight is 122 lb (55.3 kg). His temperature is 97.6 F (36.4 C). His blood pressure is 147/86 (abnormal) and his pulse is 77. His respiration is  20 and oxygen saturation is 100%. .  General: Alert and oriented, in no acute distress HEENT: Head is normocephalic. Extraocular movements are intact. Oropharynx is notable for no lesions in the upper throat.    Tongue remains swollen and firm.  Floor of mouth notable for necrotic tissue versus collection of debris Skin: Skin in treatment fields shows satisfactory healing  - intact. Neck:  Lymphedema of anterior neck present.  1 cm right submandibular mass appreciated consistent with calcified node on imaging CHEST CTAB HEART RRR PSYCH pleasant affect, alert and oriented Neuro: non focal  Lab Findings: Lab Results  Component Value Date   WBC 6.7 03/01/2021   HGB 14.2 03/01/2021   HCT 41.1 03/01/2021   MCV 99.3 03/01/2021   PLT 213 03/01/2021    Lab Results  Component Value Date   TSH 33.497 (H) 06/17/2021   CMP     Component Value Date/Time   NA 134 (L) 06/17/2021 1142   K 4.1 06/17/2021 1142   CL 96 (L) 06/17/2021 1142   CO2 27 06/17/2021 1142   GLUCOSE 78 06/17/2021 1142   BUN 6 06/17/2021 1142   CREATININE 0.82 06/17/2021 1142   CALCIUM 9.5 06/17/2021 1142   PROT 7.3 06/15/2020 0927   ALBUMIN 3.8 06/15/2020 0927   AST 28 06/15/2020 0927   ALT 32 06/15/2020 0927   ALKPHOS 73 06/15/2020 0927   BILITOT 0.4 06/15/2020 0927   GFRNONAA >60 06/17/2021 1142   GFRAA >60 05/03/2020 0940    Radiographic Findings: CT Soft Tissue Neck W Contrast  Result Date: 06/18/2021 CLINICAL DATA:  55 year old male with history of treated tongue/floor of mouth carcinoma - diagnosed in May 2021 with chemotherapy and radiation complete. Restaging. EXAM: CT NECK WITH CONTRAST TECHNIQUE: Multidetector CT imaging of the neck was performed using the standard protocol following the bolus administration of intravenous contrast. CONTRAST:  65mL OMNIPAQUE IOHEXOL 350 MG/ML SOLN COMPARISON:  Chest CT the same day is reported separately. Prior Neck CT 03/13/2021. Outside face CT without contrast  12/22/2019 FINDINGS: Pharynx and larynx: Mild generalized pharyngeal mucosal space soft tissue thickening compatible with XRT. Associated increased lower parapharyngeal and retropharyngeal space effusions since August. Stable soft tissue deficiency at the anterior floor of mouth in the midline with small volume gas and retained secretions (sagittal image 59). Generalized progressed size of the oral tongue since August. But no discrete tumor or masslike enhancement. Salivary glands: Stable sublingual space. Post XRT changes to the submandibular glands (partially atrophied) and parotid glands (now hyperenhancing greater on the left). Thyroid: Negative. Lymph nodes: None enlarged right level 1 lymph node in 2021 has decreased in size and is partially rim calcified now (series 2, image 57), stable since August 8-9 mm short axis. No other abnormal lymph node identified. Diminutive level 2 nodes are 4-5 mm short axis or smaller (series 2, image 53  on the left). Vascular: Major vascular structures in the neck and at the skull base are enhancing and patent. Right greater than left proximal ICA atherosclerosis. Limited intracranial: Negative. Visualized orbits: Negative. Mastoids and visualized paranasal sinuses: Visualized paranasal sinuses and mastoids are stable and well aerated. Skeleton: Absent dentition. Cervical spine degeneration. No acute or suspicious osseous lesion. Upper chest: Detailed separately. But there is an enlarging right apical lung nodule, 8 mm now on series 4, image 101 versus 4-5 mm in August. IMPRESSION: 1. Satisfactory post treatment appearance of the Neck. NI-Rads category 1. 2. But enlarging right apical lung nodule since August, 8 mm now. See CT Chest reported separately. Electronically Signed   By: Genevie Ann M.D.   On: 06/18/2021 08:36   CT Chest W Contrast  Result Date: 06/18/2021 CLINICAL DATA:  Tongue cancer status post chemotherapy and XRT EXAM: CT CHEST WITH CONTRAST TECHNIQUE:  Multidetector CT imaging of the chest was performed during intravenous contrast administration. CONTRAST:  60mL OMNIPAQUE IOHEXOL 350 MG/ML SOLN COMPARISON:  CT chest, 03/13/2021, PET-CT, 08/28/2020 FINDINGS: Cardiovascular: Scattered aortic atherosclerosis. Normal heart size. Three-vessel coronary artery calcifications and. No pericardial effusion. Mediastinum/Nodes: No enlarged mediastinal, hilar, or axillary lymph nodes. Thyroid gland, trachea, and esophagus demonstrate no significant findings. Lungs/Pleura: Mild centrilobular and paraseptal emphysema and diffuse bilateral bronchial wall thickening. Interval enlargement and slight cavitation of a nodule of the posterior right pulmonary apex 0.9 x 0.7 cm, previously 0.4 x 0.3 cm (series 6, image 32). Interval enlargement and subtle cavitation of a nodule of the left pulmonary apex measuring 0.4 cm previously no greater than 0.1 cm (series 6, image 35). Interval enlargement of an irregular subpleural nodule of the anterior left upper lobe, measuring 1.2 x 0.7 cm previously 0.8 x 0.6 cm (series 6, image 90). Interval enlargement of a nodule of the subpleural anterior right upper lobe, measuring 0.3 cm, previously no greater than 0.1 cm (series 6, image 61). Interval enlargement of a subpleural nodule of the peripheral right lower lobe, measuring 1.1 x 0.6 cm, previously difficult to discretely measure although no greater than 0.4 cm (series 6, image 117). New 0.4 cm subpleural nodule of the left lower lobe (series 6, image 1) no pleural effusion or pneumothorax. Upper Abdomen: No acute abnormality. Musculoskeletal: No chest wall mass or suspicious bone lesions identified. IMPRESSION: 1. Multiple new and enlarged small pulmonary nodules, consistent with pulmonary metastatic disease. Although the majority of these remain too small to reliably for metabolic activity by PET-CT, the largest nodules, particularly in the right apex, are likely amenable. 2. Emphysema. 3.  Coronary artery disease. These results will be called to the ordering clinician or representative by the Radiologist Assistant, and communication documented in the PACS or [employer]. Aortic Atherosclerosis (ICD10-I70.0) and Emphysema (ICD10-J43.9). Electronically Signed   By: Delanna Ahmadi M.D.   On: 06/18/2021 09:12     Impression/Plan:    1) Head and Neck Cancer Status:   I personally reviewed his imaging with him.  He has lung nodules that are concerning and I have contacted interventional radiology to see if any of these are amenable to CT-guided biopsy.  He will also be discussed tomorrow at our tumor board.  He knows we will get in touch with him upon the recommendations.  He has been lost to follow-up with otolaryngology and we will refer him back given the necrotic tissue versus copious debris in the floor of mouth.  I recommended at his last appointment that he use a  irrigation device but he has not done this yet.  2) Nutritional Status: Weight has stabilized and he no longer has a PEG tube Wt Readings from Last 3 Encounters:  06/18/21 122 lb (55.3 kg)  03/01/21 112 lb 9.6 oz (51.1 kg)  10/03/20 120 lb 4.8 oz (54.6 kg)    3) Risk Factors: The patient has been educated about risk factors including alcohol and tobacco abuse; they understand that avoidance of alcohol and tobacco is important to prevent recurrences as well as other cancers  Still smoking AGAINST MEDICAL ADVICE  4) Swallowing: Continue speech-language pathology exercises  5) Thyroid function: TSH elevated and he reports that he has not taken his levothyroxine because it caused upset stomach.  He does not have a primary doctor so we will make a referral to internal medicine for further management Lab Results  Component Value Date   TSH 33.497 (H) 06/17/2021   6) Follow-up in radiation oncology pending work-up of lung nodules - he knows how to contact us if he has any questions in the future.  We also discussed  the importance of continued follow-up in medical oncology.  On date of service, in total, I spent 40 minutes on this encounter. Patient was seen in person. _____________________________________   Eppie Gibson, MD

## 2021-06-19 ENCOUNTER — Other Ambulatory Visit: Payer: Self-pay

## 2021-06-19 DIAGNOSIS — C049 Malignant neoplasm of floor of mouth, unspecified: Secondary | ICD-10-CM

## 2021-06-19 NOTE — Progress Notes (Signed)
Oncology Nurse Navigator Documentation   Per patient's 06/18/21 post-treatment follow-up with [name], sent fax to [facility] ENT Scheduling with request Mr. Reason be contacted and scheduled for routine post-RT follow-up with Dr. [name] in 2 months.  Notification of successful fax transmission received.   [name] RN, BSN, OCN Head & Neck Oncology Nurse [place] at [facility] Phone # [phone]  Fax # [phone]

## 2021-06-19 NOTE — Progress Notes (Signed)
Oncology Nurse Navigator Documentation   I met with Casey [name] during his follow up with [name] yesterday to receive results of his recent CT scans. [name] had contacted an Interventional Radiologist to see if the noted lung nodules would be amendable to CT biopsy but it was determined that a PET scan would be more appropriate. Casey [name] has been made aware and knows that I will notify him of the date/time of the PET scan. He knows to contact me for any needs.  [name] RN, BSN, OCN Head & Neck Oncology Nurse [place] at [facility] Phone # [phone]  Fax # [phone]

## 2021-06-24 ENCOUNTER — Telehealth: Payer: Self-pay | Admitting: *Deleted

## 2021-06-24 NOTE — Telephone Encounter (Signed)
Called patient to inform of Pet Scan for 07-16-21- arrival time- 10:30 am , patient to have water only - 6 hrs. prior to test, patient to receive results from [name] on 07-19-21 @ 3:20 pm, spoke with patient and he verified understanding these appts.

## 2021-07-04 DIAGNOSIS — Z419 Encounter for procedure for purposes other than remedying health state, unspecified: Secondary | ICD-10-CM | POA: Diagnosis not present

## 2021-07-16 ENCOUNTER — Ambulatory Visit (HOSPITAL_COMMUNITY)
Admission: RE | Admit: 2021-07-16 | Discharge: 2021-07-16 | Disposition: A | Discharge status: Home / Self Care | Payer: Medicaid Other | Source: Ambulatory Visit | Attending: Radiation Oncology | Admitting: Radiation Oncology

## 2021-07-16 ENCOUNTER — Other Ambulatory Visit: Payer: Self-pay

## 2021-07-16 DIAGNOSIS — I2510 Atherosclerotic heart disease of native coronary artery without angina pectoris: Secondary | ICD-10-CM | POA: Diagnosis not present

## 2021-07-16 DIAGNOSIS — J439 Emphysema, unspecified: Secondary | ICD-10-CM | POA: Diagnosis not present

## 2021-07-16 DIAGNOSIS — C760 Malignant neoplasm of head, face and neck: Secondary | ICD-10-CM | POA: Diagnosis not present

## 2021-07-16 DIAGNOSIS — C049 Malignant neoplasm of floor of mouth, unspecified: Secondary | ICD-10-CM

## 2021-07-16 DIAGNOSIS — I7091 Generalized atherosclerosis: Secondary | ICD-10-CM | POA: Diagnosis not present

## 2021-07-16 LAB — GLUCOSE, CAPILLARY: Glucose-Capillary: 95 mg/dL (ref 70–99)

## 2021-07-16 MED ORDER — FLUDEOXYGLUCOSE F - 18 (FDG) INJECTION: 6.0400 | Freq: Once | INTRAVENOUS | Status: DC | PRN

## 2021-07-18 ENCOUNTER — Telehealth: Payer: Self-pay | Admitting: *Deleted

## 2021-07-18 ENCOUNTER — Other Ambulatory Visit: Payer: Self-pay

## 2021-07-18 DIAGNOSIS — E038 Other specified hypothyroidism: Secondary | ICD-10-CM

## 2021-07-18 NOTE — Telephone Encounter (Signed)
CALLED PATIENT TO ASK ABOUT COMING FOR LABS TOMORROW PRIOR TO FU, PATIENT AGREED TO COME ON 07-19-21 @ 3 PM

## 2021-07-19 ENCOUNTER — Ambulatory Visit
Admission: RE | Admit: 2021-07-19 | Discharge: 2021-07-19 | Disposition: A | Discharge status: Home / Self Care | Payer: Medicaid Other | Source: Ambulatory Visit | Attending: Radiation Oncology | Admitting: Radiation Oncology

## 2021-07-19 ENCOUNTER — Encounter: Payer: Self-pay | Admitting: Radiation Oncology

## 2021-07-19 ENCOUNTER — Other Ambulatory Visit: Payer: Self-pay

## 2021-07-19 VITALS — BP 129/73 | HR 73 | Temp 97.0°F | Resp 18 | Ht 63.0 in | Wt 119.0 lb

## 2021-07-19 DIAGNOSIS — C049 Malignant neoplasm of floor of mouth, unspecified: Secondary | ICD-10-CM | POA: Insufficient documentation

## 2021-07-19 DIAGNOSIS — E038 Other specified hypothyroidism: Secondary | ICD-10-CM | POA: Diagnosis not present

## 2021-07-19 NOTE — Progress Notes (Signed)
Casey [name] presents today for follow-up after completing radiation to his tongue/floor of mouth on 05/23/2020, and to review PET scan results from 07/16/2021  Pain issues, if any: No Using a feeding tube?: No Weight changes, if any: No Wt Readings from Last 3 Encounters:  07/19/21 119 lb (54 kg)  06/18/21 122 lb (55.3 kg)  03/01/21 112 lb 9.6 oz (51.1 kg)   Swallowing issues, if any: No Unable to tolerate acidic foods or beverages, but otherwise reports he can eat and drink wide variety Smoking or chewing tobacco? Yes- Continues smoke occasionally, but is hoping to quit when he moves back to [place] in December Using fluoride trays daily? N/A Last ENT visit was on: July 2022-per patient. Not since he saw Dr. [name] on 10/18/2020 Other notable issues, if any: Continues deal with random/intermitent pain behind his right ear (reports it will come on sudenly without warning, and then resolve on it's own). Continued to deal with dry mouth and occasional thick saliva. Reports he is only taking the dexamethasone occasionally if he wakes up and his mouth/upper throat feels swollen

## 2021-07-19 NOTE — Progress Notes (Signed)
Radiation Oncology         [phone] ________________________________  Name: Casey [name] MRN: [id number]  Date: 07/19/2021  DOB: 12-May-1966  Follow-Up Visit Note  CC: Pcp, No  Nicholas Lose, MD  Diagnosis and Prior Radiotherapy:    C02.3   ICD-10-CM   1. 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 ([place]) Staging form: Oral Cavity, AJCC 8th Edition - Clinical: Stage IVA (cT4a, cN2b, cM0) - Signed by Nicholas Lose, MD on 03/07/2020 Histologic grade (G): G3 Histologic grading system: 3 grade system   CHIEF COMPLAINT:  Here for follow-up and PET/CT results  Narrative:   Casey [name] presents today for follow-up after completing radiation to his tongue/floor of mouth on 05/23/2020, and to review PET scan results from 07/16/2021  Pain issues, if any: No Using a feeding tube?: No Weight changes, if any: No Wt Readings from Last 3 Encounters:  07/19/21 119 lb (54 kg)  06/18/21 122 lb (55.3 kg)  03/01/21 112 lb 9.6 oz (51.1 kg)   Swallowing issues, if any: No Unable to tolerate acidic foods or beverages, but otherwise reports he can eat and drink wide variety Smoking or chewing tobacco? Yes- Continues smoke occasionally, but is hoping to quit when he moves back to [place] in December Using fluoride trays daily? N/A Last ENT visit was on: July 2022-per patient. Not since he saw Dr. [name] on 10/18/2020 Other notable issues, if any: Continues deal with random/intermitent pain behind his right ear (reports it will come on sudenly without warning, and then resolve on it's own). Continued to deal with dry mouth and occasional thick saliva. Reports he is only taking the dexamethasone occasionally if he wakes up and his mouth/upper throat feels swollen        ALLERGIES:  has No Known Allergies.  Meds: Current Outpatient Medications  Medication Sig Dispense Refill   dexamethasone (DECADRON) 1 MG  tablet Take no more than 1 tablet every other day, PRN mouth swelling. 30 tablet 1   docusate (COLACE) 50 MG/5ML liquid Take by mouth every other day.     fluconazole (DIFLUCAN) 100 MG tablet Take 1 tablet (100 mg total) by mouth daily. 14 tablet 0   ibuprofen (ADVIL) 200 MG tablet Take 800 mg by mouth every 6 (six) hours as needed for moderate pain.     levothyroxine (SYNTHROID) 25 MCG tablet Take 1 tablet every morning on an empty stomach, 1 hour before coffee, other meds, or food. (Patient not taking: Reported on 06/18/2021) 90 tablet 1   lidocaine (XYLOCAINE) 2 % solution MIX 1 PART 2% VISCOUS LIDOCAINE WITH 1 PART WATER. SWISH/SPIT 10ML OF DILUTED MIXTURE TO NUMB MOUTH, UP TO 6 TIMES DAILY AS NEEDED 200 mL 4   Multiple Vitamin (MULTIVITAMIN) tablet Take 1 tablet by mouth daily. (Patient not taking: No sig reported)     nicotine (NICODERM CQ) 14 mg/24hr patch Place 1 patch (14 mg total) onto the skin daily. 28 patch 1   zolpidem (AMBIEN) 5 MG tablet TAKE 1 TABLET BY MOUTH AT BEDTIME AS NEEDED FOR SLEEP. (Patient not taking: Reported on 10/03/2020) 30 tablet 1   No current facility-administered medications for this encounter.   Facility-Administered Medications Ordered in Other Encounters  Medication Dose Route Frequency Provider Last Rate Last Admin   fludeoxyglucose F - 18 (FDG) injection 1.19 millicurie  4.17 millicurie Intravenous Once PRN Felipa Emory,  MD        Physical Findings: The patient is in no acute distress. Patient is alert and oriented. Wt Readings from Last 3 Encounters:  07/19/21 119 lb (54 kg)  06/18/21 122 lb (55.3 kg)  03/01/21 112 lb 9.6 oz (51.1 kg)    height is 5\' 3"  (1.6 m) and weight is 119 lb (54 kg). His temporal temperature is 97 F (36.1 C) (abnormal). His blood pressure is 129/73 and his pulse is 73. His respiration is 18 and oxygen saturation is 99%. .  General: Alert and oriented, in no acute distress HEENT: Head is normocephalic. Extraocular movements  are intact. Oropharynx is notable for no lesions in the upper throat.   No thrush.  Floor of mouth notable for necrotic tissue versus collection of debris Skin: Skin in treatment fields shows satisfactory healing  - intact. Neck:  Lymphedema of anterior neck present.  1 cm right submandibular mass appreciated consistent with calcified node on imaging CHEST CTAB HEART RRR PSYCH pleasant affect, alert and oriented Neuro: non focal  Lab Findings: Lab Results  Component Value Date   WBC 6.7 03/01/2021   HGB 14.2 03/01/2021   HCT 41.1 03/01/2021   MCV 99.3 03/01/2021   PLT 213 03/01/2021    Lab Results  Component Value Date   TSH 33.497 (H) 06/17/2021   CMP     Component Value Date/Time   NA 134 (L) 06/17/2021 1142   K 4.1 06/17/2021 1142   CL 96 (L) 06/17/2021 1142   CO2 27 06/17/2021 1142   GLUCOSE 78 06/17/2021 1142   BUN 6 06/17/2021 1142   CREATININE 0.82 06/17/2021 1142   CALCIUM 9.5 06/17/2021 1142   PROT 7.3 06/15/2020 0927   ALBUMIN 3.8 06/15/2020 0927   AST 28 06/15/2020 0927   ALT 32 06/15/2020 0927   ALKPHOS 73 06/15/2020 0927   BILITOT 0.4 06/15/2020 0927   GFRNONAA >60 06/17/2021 1142   GFRAA >60 05/03/2020 0940    Radiographic Findings: NM PET Image Restag (PS) Skull Base To Thigh  Result Date: 07/17/2021 CLINICAL DATA:  Subsequent treatment strategy for head neck cancer in a 55 year old male. EXAM: NUCLEAR MEDICINE PET SKULL BASE TO THIGH TECHNIQUE: 6.04 mCi F-18 FDG was injected intravenously. Full-ring PET imaging was performed from the skull base to thigh after the radiotracer. CT data was obtained and used for attenuation correction and anatomic localization. Fasting blood glucose: 95 mg/dl COMPARISON:  PET exam August 28, 2020. FINDINGS: Mediastinal blood pool activity: SUV max 2.57 Liver activity: SUV max NA NECK: Area of increased metabolic activity along the tongue near the posterior/base of tongue with markedly decreased size of the relative area of  metabolic activity and diminished uptake of FDG, maximum SUV 7.0 as compared to 15.2. Area measuring approximately 2.1 cm greatest axial dimension as determined by PET imaging without visible discrete lesion, this area measured approximately 5.6 cm when measured in a similar fashion on the prior study. There is some moderate activity that tracks along the RIGHT floor the mouth and base of tongue from this area also diminished. Post treatment changes about the neck with peripherally calcified level I lymph node without increased metabolic activity. No hypermetabolic lymph nodes in the neck Incidental CT findings: Stranding in the soft tissues in blurring of fascial planes compatible with post treatment changes. CHEST: RIGHT apical pulmonary nodule (image 65/4) 8 mm reported is increased on previous imaging with a maximum SUV of 3.3 and subtle central cavitation. Small LEFT apical  nodule also with subtle cavitation (image 66/4) maximum SUV 1.2. Subtle ground-glass in the anterior LEFT upper lobe. Signs of prior granulomatous disease with calcified nodule in the LEFT anterior chest. Pleural based nodule (image 50/8) RIGHT lower lobe 9 mm without substantial increased metabolic activity in hindsight there was a very subtle 1-2 mm regularity along the pleural surface in the RIGHT lower lobe on the prior study, now clearly larger. In the area of this nodule there is very slight increased metabolic activity, visually different from other areas in the chest Incidental CT findings: Calcified coronary artery disease. Normal heart size. Normal caliber central pulmonary vasculature. Scattered generalized atherosclerosis. Limited assessment of cardiovascular structures given lack of intravenous contrast. ABDOMEN/PELVIS: No abnormal hypermetabolic activity within the liver, pancreas, adrenal glands, or spleen. No hypermetabolic lymph nodes in the abdomen or pelvis. Incidental CT findings: No acute findings in the abdomen. Aortic  atherosclerosis without aneurysmal dilation of the abdominal aorta. SKELETON: No focal hypermetabolic activity to suggest skeletal metastasis. Incidental CT findings: Spinal degenerative changes. IMPRESSION: Marked decreased size of and metabolism associated with the tongue. Findings could reflect a combination of residual disease and or post treatment change but given more focal appearance would consider the possibility of residual. Suggest close attention on follow-up. No discrete mass is visible. No FDG avid lymph nodes in the neck. FDG avid pulmonary nodules in the upper lobes with enlarging pleural base nodule in the RIGHT lung base. Findings are suspicious for metastatic disease. Pulmonary emphysema as before. Aortic atherosclerosis and coronary artery disease. Aortic Atherosclerosis (ICD10-I70.0) and Emphysema (ICD10-J43.9). Electronically Signed   By: Zetta Bills M.D.   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 I offered empiric SBRT vs biopsy vs referral for systemic therapy. We discussed pros and cons of each option.  He is electing to observe the small lung nodules that appear suspicious on PET. May be a good candidate for SBRT in future. Understands that if widespread mets develop, systemic therapy may be needed.   Anderson Malta, RN will arrange CT chest (no contrast) on March 13th so we can review at tumor board. I asked her to move Dr Rob Hickman Feb  appt to follow the CT chest in March.  At tumor board we can decide if he should see me too (for SBRT) or see me in 3 mo / June (for cont'd imaging).  He still isn't scheduled w/ [name] to his knowledge. I asked Anderson Malta to get him on his schedule in 20mo.    2) Nutritional Status: Weight has stabilized and he no longer has a PEG tube Wt Readings from Last 3 Encounters:  07/19/21 119 lb (54 kg)  06/18/21 122 lb (55.3 kg)  03/01/21 112 lb 9.6 oz (51.1 kg)    3) Risk  Factors: The patient has been educated about risk factors including alcohol and tobacco abuse; they understand that avoidance of alcohol and tobacco is important to prevent recurrences as well as other cancers  Still smoking AGAINST MEDICAL ADVICE  4) Swallowing: Continue speech-language pathology exercises  5) Thyroid function: TSH pending today. He is still not taking levothyroxine regularly.  Will try it at night, as I hope that will improve follow through. Dr Peggye Form w/ request to check his TSH in January. Pt knows to ask for this as well. Lab Results  Component Value Date   TSH 33.497 (H) 06/17/2021  On date of service, in total, I spent 40 minutes on this encounter. Patient was seen in person. _____________________________________   Eppie Gibson, MD

## 2021-07-22 ENCOUNTER — Other Ambulatory Visit: Payer: Self-pay

## 2021-07-22 ENCOUNTER — Other Ambulatory Visit: Payer: Self-pay | Admitting: Critical Care Medicine

## 2021-07-22 DIAGNOSIS — C049 Malignant neoplasm of floor of mouth, unspecified: Secondary | ICD-10-CM

## 2021-07-22 DIAGNOSIS — Z1329 Encounter for screening for other suspected endocrine disorder: Secondary | ICD-10-CM

## 2021-07-22 LAB — TSH: TSH: 27.432 u[IU]/mL — ABNORMAL HIGH (ref 0.320–4.118)

## 2021-07-22 NOTE — Progress Notes (Signed)
TSH order for JAN OV

## 2021-07-23 ENCOUNTER — Telehealth: Payer: Self-pay | Admitting: Hematology and Oncology

## 2021-07-23 NOTE — Telephone Encounter (Signed)
Scheduled per sch msg. Called, not able to leave msg. Mailed printout

## 2021-08-04 DIAGNOSIS — Z419 Encounter for procedure for purposes other than remedying health state, unspecified: Secondary | ICD-10-CM | POA: Diagnosis not present

## 2021-08-07 ENCOUNTER — Other Ambulatory Visit: Payer: Self-pay | Admitting: Radiation Oncology

## 2021-08-07 DIAGNOSIS — C049 Malignant neoplasm of floor of mouth, unspecified: Secondary | ICD-10-CM

## 2021-08-07 MED ORDER — LIDOCAINE VISCOUS HCL 2 % MT SOLN
OROMUCOSAL | 4 refills | Status: DC
Start: 2021-08-07 — End: 2022-05-27
  Filled 2021-08-07: qty 200, 7d supply, fill #0

## 2021-08-08 ENCOUNTER — Other Ambulatory Visit: Payer: Self-pay

## 2021-08-08 ENCOUNTER — Other Ambulatory Visit (HOSPITAL_COMMUNITY): Payer: Self-pay

## 2021-08-08 MED ORDER — NICOTINE 7 MG/24HR TD PT24
7.0000 mg | MEDICATED_PATCH | Freq: Every day | TRANSDERMAL | 0 refills | Status: DC
  Filled 2021-08-08: qty 14, 14d supply, fill #0

## 2021-08-08 MED ORDER — NICOTINE 14 MG/24HR TD PT24
14.0000 mg | MEDICATED_PATCH | Freq: Every day | TRANSDERMAL | 0 refills | Status: DC
  Filled 2021-08-08: qty 28, 28d supply, fill #0

## 2021-08-12 ENCOUNTER — Other Ambulatory Visit (HOSPITAL_COMMUNITY): Payer: Self-pay

## 2021-08-13 ENCOUNTER — Encounter: Payer: Self-pay | Admitting: Critical Care Medicine

## 2021-08-13 ENCOUNTER — Other Ambulatory Visit (HOSPITAL_COMMUNITY): Payer: Self-pay

## 2021-08-13 ENCOUNTER — Ambulatory Visit: Payer: Medicaid Other | Attending: Critical Care Medicine | Admitting: Critical Care Medicine

## 2021-08-13 ENCOUNTER — Telehealth: Payer: Self-pay

## 2021-08-13 DIAGNOSIS — C023 Malignant neoplasm of anterior two-thirds of tongue, part unspecified: Secondary | ICD-10-CM

## 2021-08-13 DIAGNOSIS — C049 Malignant neoplasm of floor of mouth, unspecified: Secondary | ICD-10-CM | POA: Insufficient documentation

## 2021-08-13 DIAGNOSIS — Z139 Encounter for screening, unspecified: Secondary | ICD-10-CM

## 2021-08-13 DIAGNOSIS — R918 Other nonspecific abnormal finding of lung field: Secondary | ICD-10-CM | POA: Insufficient documentation

## 2021-08-13 DIAGNOSIS — F17210 Nicotine dependence, cigarettes, uncomplicated: Secondary | ICD-10-CM | POA: Diagnosis not present

## 2021-08-13 MED ORDER — BUPROPION HCL ER (XL) 150 MG PO TB24
150.0000 mg | ORAL_TABLET | Freq: Every day | ORAL | 1 refills | Status: DC
  Filled 2021-08-13: qty 30, 30d supply, fill #0
  Filled 2021-09-05: qty 30, 30d supply, fill #1

## 2021-08-13 MED ORDER — NICOTINE 14 MG/24HR TD PT24
14.0000 mg | MEDICATED_PATCH | Freq: Every day | TRANSDERMAL | 0 refills | Status: DC
  Filled 2021-08-13 – 2021-09-05 (×2): qty 28, 28d supply, fill #0

## 2021-08-13 NOTE — Telephone Encounter (Signed)
Pt was called and informed of upcoming appointment.

## 2021-08-13 NOTE — Progress Notes (Signed)
New Patient Office Visit  Subjective:  Patient ID: Casey [name], male    DOB: 1965-10-21  Age: 55 y.o. MRN: [id number] Virtual Visit via Telephone Note  I connected with Casey [name] on 08/14/21 at  2:00 PM EST by telephone and verified that I am speaking with the correct person using two identifiers.   Consent:  I discussed the limitations, risks, security and privacy concerns of performing an evaluation and management service by telephone and the availability of in person appointments. I also discussed with the patient that there may be a patient responsible charge related to this service. The patient expressed understanding and agreed to proceed.  Location of patient: Patient is at home  Location of provider: I am in my office  Persons participating in the televisit with the patient.   No one else on the call  The patient was not able to connect to video      HPI Casey [name] presents for primary care to establish.  This patient originally was living in [facility] but then moved to be closer to his sister in June 2020.  Prior to this in the fall 2019 in [place] he developed sores in the base of his tongue with hoarseness and swallowing difficulty.  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  He was being recommended reconstructive surgery he did not wish to have this we moved to [place] to be with his sister in June 2020 in the [place] area.  It was that time he connected with otolaryngology Dr. [name].  He also connected with medical oncology and radiation oncology at [facility] health.  He has had extensive radiotherapy and chemotherapy treatments to the base of his tongue.  Multiple notes are in our [facility] health system which I have reviewed in detail.  See the [facility] health epic system for these notes.  Currently the patient's not undergoing treatment he has had ulcerations  in the mouth it is slowly healed multiple biopsies taken of these were negative for recurrent cancer.  He did have a recent PET scan which shows nodules in the lung which are new and a pleural-based lesion as well.  These are being observed for now.  Note the patient now is edentulous and is looking for Medicaid excepting dentist for dentures.  Patient is still smoking 4 to 5 cigarettes daily and does use a 7 mg nicotine patch.  Is able to eat some meals at this time.  He has no other complaints.  He is not in need of refills   Past Medical History:  Diagnosis Date   Cancer [facility])    Depression     Past Surgical History:  Procedure Laterality Date   EXCISION OF TONGUE LESION Left 09/17/2020   Procedure: Biopsy of Anterior Tongue;  Surgeon: [name], MD;  Location: [place];  Service: ENT;  Laterality: Left;   IR GASTROSTOMY TUBE MOD SED  04/03/2020   IR GASTROSTOMY TUBE REMOVAL  10/03/2020   IR IMAGING GUIDED PORT INSERTION  04/03/2020   IR REMOVAL TUN ACCESS W/ PORT W/O FL MOD SED  10/03/2020    History reviewed. No pertinent family history.  Social History   Socioeconomic History   Marital status: Single    Spouse name: Not on file   Number of children: Not on file   Years of education: Not on file   Highest  education level: Not on file  Occupational History   Not on file  Tobacco Use   Smoking status: Every Day    Packs/day: 0.50    Types: Cigarettes   Smokeless tobacco: Never  Vaping Use   Vaping Use: Never used  Substance and Sexual Activity   Alcohol use: Yes    Alcohol/week: 3.0 standard drinks    Types: 3 Cans of beer per week    Comment: several beers "numbs my tongue"   Drug use: Never   Sexual activity: Not Currently  Other Topics Concern   Not on file  Social History Narrative   Not on file   Social Determinants of Health   Financial Resource Strain: Not on file  Food Insecurity: Not on file  Transportation Needs: Not on file   Physical Activity: Not on file  Stress: Not on file  Social Connections: Not on file  Intimate Partner Violence: Not on file    ROS Review of Systems  Constitutional:  Negative for chills, diaphoresis and fever.  HENT:  Positive for dental problem, mouth sores, trouble swallowing and voice change. Negative for congestion, hearing loss, nosebleeds, sore throat and tinnitus.   Eyes:  Negative for photophobia and redness.  Respiratory:  Negative for cough, shortness of breath, wheezing and stridor.   Cardiovascular:  Negative for chest pain, palpitations and leg swelling.  Gastrointestinal:  Negative for abdominal pain, blood in stool, constipation, diarrhea, nausea and vomiting.  Endocrine: Negative for polydipsia.  Genitourinary:  Negative for dysuria, flank pain, frequency, hematuria and urgency.  Musculoskeletal:  Negative for back pain, myalgias and neck pain.  Skin:  Negative for rash.  Allergic/Immunologic: Negative for environmental allergies.  Neurological:  Negative for dizziness, tremors, seizures, weakness and headaches.  Hematological:  Does not bruise/bleed easily.  Psychiatric/Behavioral:  Negative for suicidal ideas. The patient is not nervous/anxious.    Objective:   Today's Vitals: There were no vitals taken for this visit.  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 ([place])    Under care medical and radiation oncology along with otolaryngology for follow-up  Patient is asking for access to Medicaid excepting dentist for dentures  We will bring the patient into the office for direct exam short-term and attempt to provide access to a dentist that can provide care the patient needs      Malignant neoplasm of anterior two-thirds of tongue ([place])    As per carcinoma of the mouth assessment  Major issue here is the patient still smoking 5 to  6 cigarettes daily and is using a low-dose nicotine patch  Plan to bring the patient to the office for direct exam and we will get him connected to our clinical behavioral therapist for counseling on smoking cessation        Other   Tobacco dependence due to cigarettes       Current smoking consumption amount: 5 to 6 cigarettes a day  Dicsussion on advise to quit smoking and smoking impacts: Impacts on recurrence of malignancy  Patient's willingness to quit: Interested in quitting but has struggled  Methods to quit smoking discussed: Behavioral modification, nicotine replacement, alternative medications  Medication management of smoking session drugs discussed: Use of bupropion 150 mg daily patient agreed prescription written, increased dose of nicotine replacement to 14 mg daily by patch  Setting quit date not established  Follow-up arranged 3 to 4 weeks   Time spent counseling the patient: 5 minutes       Relevant Medications   nicotine (NICODERM CQ - DOSED IN MG/24 HOURS) 14 mg/24hr patch   Multiple lung nodules on CT    New development of multiple pulmonary nodules and a pleural-based mass seen on CT scan and PET scan  Follow-up PET scan scheduled short-term  Probability of malignant spread of squamous cell carcinoma of the floor the mouth to the lungs is high        Encounter for health-related screening    We need to bring this patient to the office for direct exam and further health screenings and primary care gap closures  We need to provide for the patient flu vaccine pneumococcal vaccine assess if he has received a tetanus vaccine offer patient for colon cancer screening and screening for other chronic health conditions  Appointment will be scheduled       Outpatient Encounter Medications as of 08/13/2021  Medication Sig   buPROPion (WELLBUTRIN XL) 150 MG 24 hr tablet Take 1 tablet by mouth daily.   docusate (COLACE) 50 MG/5ML liquid Take by mouth every  other day.   lidocaine (XYLOCAINE) 2 % solution MIX 1 PART 2% VISCOUS LIDOCAINE WITH 1 PART WATER. SWISH AND SPIT 10ML OF DILUTED MIXTURE TO NUMB MOUTH, UP TO 6 TIMES DAILY AS NEEDED   Multiple Vitamin (MULTIVITAMIN) tablet Take 1 tablet by mouth daily.   [DISCONTINUED] fluconazole (DIFLUCAN) 100 MG tablet Take 1 tablet (100 mg total) by mouth daily.   [DISCONTINUED] ibuprofen (ADVIL) 200 MG tablet Take 800 mg by mouth every 6 (six) hours as needed for moderate pain.   [DISCONTINUED] levothyroxine (SYNTHROID) 25 MCG tablet Take 1 tablet every morning on an empty stomach, 1 hour before coffee, other meds, or food.   [DISCONTINUED] nicotine (NICODERM CQ - DOSED IN MG/24 HOURS) 14 mg/24hr patch Place 1 patch (14 mg total) onto the skin daily.   [DISCONTINUED] nicotine (NICODERM CQ - DOSED IN MG/24 HR) 7 mg/24hr patch Place 1 patch (7 mg total) onto the skin daily for 14 days.   [DISCONTINUED] nicotine (NICODERM CQ) 14 mg/24hr patch Place 1 patch (14 mg total) onto the skin daily.   dexamethasone (DECADRON) 1 MG tablet Take no more than 1 tablet every other day, PRN mouth swelling. (Patient not taking: Reported on 08/13/2021)   nicotine (NICODERM CQ - DOSED IN MG/24 HOURS) 14 mg/24hr patch Place 1 patch (14 mg total) onto the skin daily.   [DISCONTINUED] zolpidem (AMBIEN) 5 MG tablet TAKE 1 TABLET BY MOUTH AT BEDTIME AS NEEDED FOR SLEEP. (Patient not taking: Reported on 10/03/2020)   No facility-administered encounter medications on file as of 08/13/2021.   Follow Up Instructions: Patient knows a direct exam will occur in the next 4 weeks   I discussed the assessment and treatment plan with the patient. The patient was provided an opportunity to ask questions and all were answered. The patient agreed with the plan and demonstrated an understanding of the instructions.   The patient was advised to call back or seek an in-person evaluation if the symptoms worsen or if the condition fails to improve as  anticipated.  I provided 30 minutes of non-face-to-face time during this encounter  including  median intraservice time , review of notes, labs, imaging, medications  and explaining diagnosis and management to the patient .    Asencion Noble, MD

## 2021-08-13 NOTE — Telephone Encounter (Signed)
-----   Message from Elsie Stain, MD sent at 08/13/2021  2:26 PM EST ----- Needs face to face first two weeks february

## 2021-08-14 DIAGNOSIS — R918 Other nonspecific abnormal finding of lung field: Secondary | ICD-10-CM | POA: Insufficient documentation

## 2021-08-14 DIAGNOSIS — Z139 Encounter for screening, unspecified: Secondary | ICD-10-CM | POA: Insufficient documentation

## 2021-08-14 DIAGNOSIS — Z87891 Personal history of nicotine dependence: Secondary | ICD-10-CM | POA: Insufficient documentation

## 2021-08-14 DIAGNOSIS — F17210 Nicotine dependence, cigarettes, uncomplicated: Secondary | ICD-10-CM | POA: Insufficient documentation

## 2021-08-14 NOTE — Assessment & Plan Note (Signed)
Under care medical and radiation oncology along with otolaryngology for follow-up  Patient is asking for access to Medicaid excepting dentist for dentures  We will bring the patient into the office for direct exam short-term and attempt to provide access to a dentist that can provide care the patient needs

## 2021-08-14 NOTE — Assessment & Plan Note (Signed)
° ° °•   Current smoking consumption amount: 5 to 6 cigarettes a day   Dicsussion on advise to quit smoking and smoking impacts: Impacts on recurrence of malignancy   Patient's willingness to quit: Interested in quitting but has struggled   Methods to quit smoking discussed: Behavioral modification, nicotine replacement, alternative medications   Medication management of smoking session drugs discussed: Use of bupropion 150 mg daily patient agreed prescription written, increased dose of nicotine replacement to 14 mg daily by patch   Setting quit date not established   Follow-up arranged 3 to 4 weeks   Time spent counseling the patient: 5 minutes

## 2021-08-14 NOTE — Assessment & Plan Note (Signed)
As per carcinoma of the mouth assessment  Major issue here is the patient still smoking 5 to 6 cigarettes daily and is using a low-dose nicotine patch  Plan to bring the patient to the office for direct exam and we will get him connected to our clinical behavioral therapist for counseling on smoking cessation

## 2021-08-14 NOTE — Assessment & Plan Note (Signed)
We need to bring this patient to the office for direct exam and further health screenings and primary care gap closures  We need to provide for the patient flu vaccine pneumococcal vaccine assess if he has received a tetanus vaccine offer patient for colon cancer screening and screening for other chronic health conditions  Appointment will be scheduled

## 2021-08-14 NOTE — Assessment & Plan Note (Signed)
New development of multiple pulmonary nodules and a pleural-based mass seen on CT scan and PET scan  Follow-up PET scan scheduled short-term  Probability of malignant spread of squamous cell carcinoma of the floor the mouth to the lungs is high

## 2021-08-15 ENCOUNTER — Telehealth: Payer: Self-pay | Admitting: Hematology and Oncology

## 2021-08-15 ENCOUNTER — Other Ambulatory Visit (HOSPITAL_COMMUNITY): Payer: Self-pay

## 2021-08-15 NOTE — Telephone Encounter (Signed)
Sch per 1/12 inbasket, left msg

## 2021-09-03 ENCOUNTER — Ambulatory Visit: Payer: Self-pay | Admitting: Radiation Oncology

## 2021-09-04 DIAGNOSIS — Z419 Encounter for procedure for purposes other than remedying health state, unspecified: Secondary | ICD-10-CM | POA: Diagnosis not present

## 2021-09-05 ENCOUNTER — Other Ambulatory Visit (HOSPITAL_COMMUNITY): Payer: Self-pay

## 2021-09-17 ENCOUNTER — Ambulatory Visit: Payer: Medicaid Other | Attending: Critical Care Medicine | Admitting: Critical Care Medicine

## 2021-09-17 ENCOUNTER — Other Ambulatory Visit (HOSPITAL_COMMUNITY): Payer: Self-pay

## 2021-09-17 ENCOUNTER — Other Ambulatory Visit: Payer: Self-pay

## 2021-09-17 ENCOUNTER — Encounter: Payer: Self-pay | Admitting: Critical Care Medicine

## 2021-09-17 VITALS — BP 117/76 | HR 65 | Resp 16 | Wt 125.0 lb

## 2021-09-17 DIAGNOSIS — Z23 Encounter for immunization: Secondary | ICD-10-CM

## 2021-09-17 DIAGNOSIS — R7989 Other specified abnormal findings of blood chemistry: Secondary | ICD-10-CM

## 2021-09-17 DIAGNOSIS — K130 Diseases of lips: Secondary | ICD-10-CM

## 2021-09-17 DIAGNOSIS — C049 Malignant neoplasm of floor of mouth, unspecified: Secondary | ICD-10-CM

## 2021-09-17 DIAGNOSIS — Z139 Encounter for screening, unspecified: Secondary | ICD-10-CM

## 2021-09-17 DIAGNOSIS — C023 Malignant neoplasm of anterior two-thirds of tongue, part unspecified: Secondary | ICD-10-CM

## 2021-09-17 DIAGNOSIS — Z1211 Encounter for screening for malignant neoplasm of colon: Secondary | ICD-10-CM | POA: Insufficient documentation

## 2021-09-17 DIAGNOSIS — Z87891 Personal history of nicotine dependence: Secondary | ICD-10-CM

## 2021-09-17 DIAGNOSIS — R109 Unspecified abdominal pain: Secondary | ICD-10-CM

## 2021-09-17 DIAGNOSIS — E785 Hyperlipidemia, unspecified: Secondary | ICD-10-CM | POA: Diagnosis not present

## 2021-09-17 DIAGNOSIS — R918 Other nonspecific abnormal finding of lung field: Secondary | ICD-10-CM

## 2021-09-17 DIAGNOSIS — E039 Hypothyroidism, unspecified: Secondary | ICD-10-CM | POA: Diagnosis not present

## 2021-09-17 MED ORDER — BUPROPION HCL ER (XL) 150 MG PO TB24
150.0000 mg | ORAL_TABLET | Freq: Every day | ORAL | 1 refills | Status: DC
  Filled 2021-09-17: qty 60, 60d supply, fill #0
  Filled 2021-10-02: qty 30, 30d supply, fill #0
  Filled 2021-10-27 – 2021-10-28 (×2): qty 30, 30d supply, fill #1
  Filled 2021-12-02 (×2): qty 30, 30d supply, fill #2
  Filled 2021-12-26: qty 30, 30d supply, fill #3

## 2021-09-17 MED ORDER — DICYCLOMINE HCL 10 MG PO CAPS
10.0000 mg | ORAL_CAPSULE | Freq: Three times a day (TID) | ORAL | 1 refills | Status: DC | PRN
  Filled 2021-09-17 – 2021-09-30 (×2): qty 30, 10d supply, fill #0

## 2021-09-17 NOTE — Assessment & Plan Note (Signed)
Recurrent lip lesion which needs to be assessed  Patient is going to have serial x-rays of his chest he has small lung nodules which are not considered significant but could represent metastatic disease eventually

## 2021-09-17 NOTE — Patient Instructions (Signed)
Prevnar vaccine given  Medicaid dental list given  Referral to plastic surgery to biopsy lip lesion was given  Health screen labs obtained   Cologuard kit ordered for colon cancer screen  Refill on buproprion given   Get a mens multi vitamin  Return Dr Joya Gaskins 4 months

## 2021-09-17 NOTE — Assessment & Plan Note (Addendum)
Patient has successfully quit smoking 2 weeks ago he is encouraged to maintain the use of bupropion which has been beneficial to his abstinence

## 2021-09-17 NOTE — Assessment & Plan Note (Signed)
Patient will receive health screening labs at this visit

## 2021-09-17 NOTE — Assessment & Plan Note (Signed)
Evidently no recurrence based on previous assessments status post treatment with radiotherapy and chemotherapy  There is a new lesion on the lip which has been present for about 4 months I feel would be best biopsied I will send the patient to plastic surgery for this

## 2021-09-17 NOTE — Assessment & Plan Note (Signed)
The patient does not have a history of hypothyroidism symptoms however he has had extensive radiation to the head and neck and his TSH has been consistently greater than 25 this past fall I will order a complete thyroid panel he may yet require thyroid supplement

## 2021-09-17 NOTE — Assessment & Plan Note (Signed)
Exam is very unremarkable will give Bentyl as needed

## 2021-09-17 NOTE — Assessment & Plan Note (Signed)
Patient agreeable to Cologuard screening

## 2021-09-17 NOTE — Assessment & Plan Note (Signed)
Plan referral to plastic surgery for biopsy

## 2021-09-17 NOTE — Progress Notes (Signed)
Established Patient Office Visit  Subjective:  Patient ID: Casey [name], male    DOB: 07/22/1966  Age: 56 y.o. MRN: [id number]  CC: Primary care visit to establish  HPI Casey [name] presents for primary care visit to establish face-to-face.  Note last visit was January 10 and was a video visit.   08/13/21 Casey [name] presents for primary care to establish.  This patient originally was living in [facility] but then moved to be closer to his sister in June 2020.  Prior to this in the fall 2019 in [place] he developed sores in the base of his tongue with hoarseness and swallowing difficulty.  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  He was being recommended reconstructive surgery he did not wish to have this we moved to [place] to be with his sister in June 2020 in the [place] area.  It was that time he connected with otolaryngology Dr. [name].  He also connected with medical oncology and radiation oncology at [facility] health.  He has had extensive radiotherapy and chemotherapy treatments to the base of his tongue.  Multiple notes are in our [facility] health system which I have reviewed in detail.  See the [facility] health epic system for these notes.  Currently the patient's not undergoing treatment he has had ulcerations in the mouth it is slowly healed multiple biopsies taken of these were negative for recurrent cancer.  He did have a recent PET scan which shows nodules in the lung which are new and a pleural-based lesion as well.  These are being observed for now.  Note the patient now is edentulous and is looking for Medicaid excepting dentist for dentures.  Patient is still smoking 4 to 5 cigarettes daily and does use a 7 mg nicotine patch.   Is able to eat some meals at this time.  He has no other complaints.  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  He has had extensive treatments of this documented at our last visit.  Patient seen today face-to-face.  He has developed a lesion on his lip which she is concerned about.  Is been there for several months and is not healing.  Patient also request a Medicaid dentist.  Patient also complains of occasional muscle spasticity in the stomach area.  Patient also needs colon cancer screening and is agreeable to Cologuard screening. Patient declined the flu vaccine.  He is agreeable to a pneumonia vaccination. Patient is no longer smoking cigarettes at this time and is using nicotine replacement The patient would like a dental examination to see if he is a candidate for dental implants The patient did successfully quit smoking using bupropion The patient has elevated TSH with previous radiation this needs to be further evaluated Past Medical History:  Diagnosis Date   Cancer [facility])    Depression     Past Surgical History:  Procedure Laterality Date   EXCISION OF TONGUE LESION Left 09/17/2020   Procedure: Biopsy of Anterior Tongue;  Surgeon: [name], MD;  Location: [place];  Service: ENT;  Laterality: Left;   IR GASTROSTOMY TUBE MOD SED  04/03/2020   IR GASTROSTOMY TUBE REMOVAL  10/03/2020   IR IMAGING GUIDED PORT INSERTION  04/03/2020   IR REMOVAL  TUN ACCESS W/ PORT W/O FL MOD SED  10/03/2020    History reviewed. No pertinent family history.  Social History   Socioeconomic History   Marital status: Single    Spouse name: Not on file   Number of children: Not on file   Years of education: Not on file   Highest education level: Not on file  Occupational History   Not on file  Tobacco Use   Smoking status: Every Day    Packs/day: 0.50    Types: Cigarettes   Smokeless tobacco: Never  Vaping Use   Vaping Use: Never used  Substance and Sexual Activity   Alcohol use: Yes    Alcohol/week: 3.0 standard  drinks    Types: 3 Cans of beer per week    Comment: several beers "numbs my tongue"   Drug use: Never   Sexual activity: Not Currently  Other Topics Concern   Not on file  Social History Narrative   Not on file   Social Determinants of Health   Financial Resource Strain: Not on file  Food Insecurity: Not on file  Transportation Needs: Not on file  Physical Activity: Not on file  Stress: Not on file  Social Connections: Not on file  Intimate Partner Violence: Not on file    Outpatient Medications Prior to Visit  Medication Sig Dispense Refill   dexamethasone (DECADRON) 1 MG tablet Take no more than 1 tablet every other day, PRN mouth swelling. 30 tablet 1   lidocaine (XYLOCAINE) 2 % solution MIX 1 PART 2% VISCOUS LIDOCAINE WITH 1 PART WATER. SWISH AND SPIT 10ML OF DILUTED MIXTURE TO NUMB MOUTH, UP TO 6 TIMES DAILY AS NEEDED 200 mL 4   nicotine (NICODERM CQ - DOSED IN MG/24 HOURS) 14 mg/24hr patch Place 1 patch (14 mg total) onto the skin daily. 28 patch 0   buPROPion (WELLBUTRIN XL) 150 MG 24 hr tablet Take 1 tablet by mouth daily. 30 tablet 1   docusate (COLACE) 50 MG/5ML liquid Take by mouth every other day. (Patient not taking: Reported on 09/17/2021)     Multiple Vitamin (MULTIVITAMIN) tablet Take 1 tablet by mouth daily. (Patient not taking: Reported on 09/17/2021)     No facility-administered medications prior to visit.    No Known Allergies  ROS Review of Systems  Constitutional: Negative.  Negative for fever.  HENT:  Positive for trouble swallowing. Negative for ear pain, postnasal drip, rhinorrhea, sinus pressure, sore throat and voice change.        Lip lesion not healing  Eyes: Negative.   Respiratory: Negative.  Negative for apnea, cough, choking, chest tightness, shortness of breath, wheezing and stridor.   Cardiovascular: Negative.  Negative for chest pain, palpitations and leg swelling.  Gastrointestinal: Negative.  Negative for abdominal distention, abdominal  pain, nausea and vomiting.  Genitourinary: Negative.   Musculoskeletal: Negative.  Negative for arthralgias and myalgias.  Skin:  Positive for wound. Negative for rash.       Lip lesion not healing  Allergic/Immunologic: Negative.  Negative for environmental allergies and food allergies.  Neurological: Negative.  Negative for dizziness, syncope, weakness and headaches.  Hematological: Negative.  Negative for adenopathy. Does not bruise/bleed easily.  Psychiatric/Behavioral: Negative.  Negative for agitation, dysphoric mood, sleep disturbance and suicidal ideas. The patient is not nervous/anxious.      Objective:    Physical Exam Vitals reviewed.  Constitutional:      Appearance: Normal appearance. He is well-developed. He is not diaphoretic.  HENT:  Head: Normocephalic and atraumatic.     Nose: Nose normal. No nasal deformity, septal deviation, mucosal edema or rhinorrhea.     Right Sinus: No maxillary sinus tenderness or frontal sinus tenderness.     Left Sinus: No maxillary sinus tenderness or frontal sinus tenderness.     Mouth/Throat:     Pharynx: No oropharyngeal exudate.     Comments: Necrotic appearing tissue at the base of the tongue at the site of previous radiotherapy and chemotherapy.  This site has been biopsied extensively shows no evidence of cancer recurrence the patient is also edentulous Eyes:     General: No scleral icterus.    Conjunctiva/sclera: Conjunctivae normal.     Pupils: Pupils are equal, round, and reactive to light.  Neck:     Thyroid: No thyromegaly.     Vascular: No carotid bruit or JVD.     Trachea: Trachea normal. No tracheal tenderness or tracheal deviation.     Comments: The tissue at the top of the neck under the jaw is firm as evidence from previous radiotherapy Cardiovascular:     Rate and Rhythm: Normal rate and regular rhythm.     Chest Wall: PMI is not displaced.     Pulses: Normal pulses. No decreased pulses.     Heart sounds: Normal  heart sounds, S1 normal and S2 normal. Heart sounds not distant. No murmur heard. No systolic murmur is present.  No diastolic murmur is present.    No friction rub. No gallop. No S3 or S4 sounds.  Pulmonary:     Effort: No tachypnea, accessory muscle usage or respiratory distress.     Breath sounds: No stridor. No decreased breath sounds, wheezing, rhonchi or rales.  Chest:     Chest wall: No tenderness.  Abdominal:     General: Bowel sounds are normal. There is no distension.     Palpations: Abdomen is soft. Abdomen is not rigid.     Tenderness: There is no abdominal tenderness. There is no guarding or rebound.  Musculoskeletal:        General: Normal range of motion.     Cervical back: Normal range of motion. No edema, erythema or rigidity. No muscular tenderness. Normal range of motion.  Lymphadenopathy:     Head:     Right side of head: No submental or submandibular adenopathy.     Left side of head: No submental or submandibular adenopathy.     Cervical: No cervical adenopathy.  Skin:    General: Skin is warm and dry.     Coloration: Skin is not pale.     Findings: No rash.     Nails: There is no clubbing.  Neurological:     Mental Status: He is alert and oriented to person, place, and time.     Sensory: No sensory deficit.  Psychiatric:        Speech: Speech normal.        Behavior: Behavior normal.    BP 117/76    Pulse 65    Resp 16    Wt 125 lb (56.7 kg)    SpO2 97%    BMI 22.14 kg/m  Wt Readings from Last 3 Encounters:  09/17/21 125 lb (56.7 kg)  07/19/21 119 lb (54 kg)  06/18/21 122 lb (55.3 kg)     Health Maintenance Due  Topic Date Due   Zoster Vaccines- Shingrix (1 of 2) Never done   Fecal DNA (Cologuard)  Never done   COVID-19 Vaccine (  4 - Booster for [employer] series) 10/23/2020    There are no preventive care reminders to display for this patient.  Lab Results  Component Value Date   TSH 27.432 (H) 07/19/2021   Lab Results  Component Value Date    WBC 6.7 03/01/2021   HGB 14.2 03/01/2021   HCT 41.1 03/01/2021   MCV 99.3 03/01/2021   PLT 213 03/01/2021   Lab Results  Component Value Date   NA 134 (L) 06/17/2021   K 4.1 06/17/2021   CO2 27 06/17/2021   GLUCOSE 78 06/17/2021   BUN 6 06/17/2021   CREATININE 0.82 06/17/2021   BILITOT 0.4 06/15/2020   ALKPHOS 73 06/15/2020   AST 28 06/15/2020   ALT 32 06/15/2020   PROT 7.3 06/15/2020   ALBUMIN 3.8 06/15/2020   CALCIUM 9.5 06/17/2021   ANIONGAP 11 06/17/2021   No results found for: CHOL No results found for: HDL No results found for: LDLCALC No results found for: TRIG No results found for: CHOLHDL No results found for: HGBA1C    Assessment & Plan:   Problem List Items Addressed This Visit       Digestive   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    Recurrent lip lesion which needs to be assessed  Patient is going to have serial x-rays of his chest he has small lung nodules which are not considered significant but could represent metastatic disease eventually      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)    Evidently no recurrence based on previous assessments status post treatment with radiotherapy and chemotherapy  There is a new lesion on the lip which has been present for about 4 months I feel would be best biopsied I will send the patient to plastic surgery for this        Other   Former tobacco use    Patient has successfully quit smoking 2 weeks ago he is encouraged to maintain the use of bupropion which has been beneficial to his abstinence      Multiple lung nodules on CT    This is being monitored by oncology      Encounter for health-related screening    Patient will receive health screening labs at this visit      Relevant Orders   Comprehensive metabolic panel   Lipid panel   CBC with Differential/Platelet   Thyroid Panel With TSH   Colon cancer screening    Patient agreeable to Cologuard  screening      Relevant Orders   Cologuard   Lip lesion    Plan referral to plastic surgery for biopsy      Relevant Orders   Ambulatory referral to Plastic Surgery   Elevated TSH    The patient does not have a history of hypothyroidism symptoms however he has had extensive radiation to the head and neck and his TSH has been consistently greater than 25 this past fall I will order a complete thyroid panel he may yet require thyroid supplement      Relevant Orders   Thyroid Panel With TSH   Abdominal cramping    Exam is very unremarkable will give Bentyl as needed       Meds ordered this encounter  Medications   buPROPion (WELLBUTRIN XL) 150 MG 24 hr tablet    Sig: Take 1 tablet by mouth daily.    Dispense:  60 tablet    Refill:  1  dicyclomine (BENTYL) 10 MG capsule    Sig: Take 1 capsule (10 mg total) by mouth 3 (three) times daily as needed for spasms.    Dispense:  30 capsule    Refill:  1   38 minutes spent with examining this patient multiple complex decisions made multiple referrals and assessments made Follow-up: Return in about 4 months (around 01/15/2022).    Asencion Noble, MD

## 2021-09-17 NOTE — Assessment & Plan Note (Signed)
This is being monitored by oncology

## 2021-09-18 ENCOUNTER — Other Ambulatory Visit: Payer: Self-pay | Admitting: Critical Care Medicine

## 2021-09-18 ENCOUNTER — Other Ambulatory Visit (HOSPITAL_COMMUNITY): Payer: Self-pay

## 2021-09-18 ENCOUNTER — Encounter: Payer: Self-pay | Admitting: Hematology and Oncology

## 2021-09-18 ENCOUNTER — Ambulatory Visit: Payer: Medicaid Other | Admitting: Hematology and Oncology

## 2021-09-18 LAB — COMPREHENSIVE METABOLIC PANEL
ALT: 21 IU/L (ref 0–44)
AST: 30 IU/L (ref 0–40)
Albumin/Globulin Ratio: 2 (ref 1.2–2.2)
Albumin: 5 g/dL — ABNORMAL HIGH (ref 3.8–4.9)
Alkaline Phosphatase: 84 IU/L (ref 44–121)
BUN/Creatinine Ratio: 12 (ref 9–20)
BUN: 9 mg/dL (ref 6–24)
Bilirubin Total: 0.3 mg/dL (ref 0.0–1.2)
CO2: 26 mmol/L (ref 20–29)
Calcium: 9.5 mg/dL (ref 8.7–10.2)
Chloride: 95 mmol/L — ABNORMAL LOW (ref 96–106)
Creatinine, Ser: 0.78 mg/dL (ref 0.76–1.27)
Globulin, Total: 2.5 g/dL (ref 1.5–4.5)
Glucose: 64 mg/dL — ABNORMAL LOW (ref 70–99)
Potassium: 4.4 mmol/L (ref 3.5–5.2)
Sodium: 136 mmol/L (ref 134–144)
Total Protein: 7.5 g/dL (ref 6.0–8.5)
eGFR: 104 mL/min/{1.73_m2} (ref >59)

## 2021-09-18 LAB — LIPID PANEL
Chol/HDL Ratio: 3.2 ratio (ref 0.0–5.0)
Cholesterol, Total: 255 mg/dL — ABNORMAL HIGH (ref 100–199)
HDL: 79 mg/dL (ref >39)
LDL Chol Calc (NIH): 110 mg/dL — ABNORMAL HIGH (ref 0–99)
Triglycerides: 388 mg/dL — ABNORMAL HIGH (ref 0–149)
VLDL Cholesterol Cal: 66 mg/dL — ABNORMAL HIGH (ref 5–40)

## 2021-09-18 LAB — CBC WITH DIFFERENTIAL/PLATELET
Basophils Absolute: 0.1 10*3/uL (ref 0.0–0.2)
Basos: 1 %
EOS (ABSOLUTE): 0.2 10*3/uL (ref 0.0–0.4)
Eos: 2 %
Hematocrit: 37.2 % — ABNORMAL LOW (ref 37.5–51.0)
Hemoglobin: 13 g/dL (ref 13.0–17.7)
Immature Grans (Abs): 0.1 10*3/uL (ref 0.0–0.1)
Immature Granulocytes: 1 %
Lymphocytes Absolute: 1.3 10*3/uL (ref 0.7–3.1)
Lymphs: 19 %
MCH: 34.9 pg — ABNORMAL HIGH (ref 26.6–33.0)
MCHC: 34.9 g/dL (ref 31.5–35.7)
MCV: 100 fL — ABNORMAL HIGH (ref 79–97)
Monocytes Absolute: 0.8 10*3/uL (ref 0.1–0.9)
Monocytes: 12 %
Neutrophils Absolute: 4.2 10*3/uL (ref 1.4–7.0)
Neutrophils: 65 %
Platelets: 266 10*3/uL (ref 150–450)
RBC: 3.73 x10E6/uL — ABNORMAL LOW (ref 4.14–5.80)
RDW: 13.3 % (ref 11.6–15.4)
WBC: 6.5 10*3/uL (ref 3.4–10.8)

## 2021-09-18 IMAGING — XA IR IMAGING GUIDED PORT INSERTION
1 series · 1 of 1 positions shown · non-contrast
Comparison: none

CLINICAL DATA: Head neck squamous cell carcinoma

[Series 300: ir imaging guided port insertion · 1 of 1 slices shown]
[im 1/1]
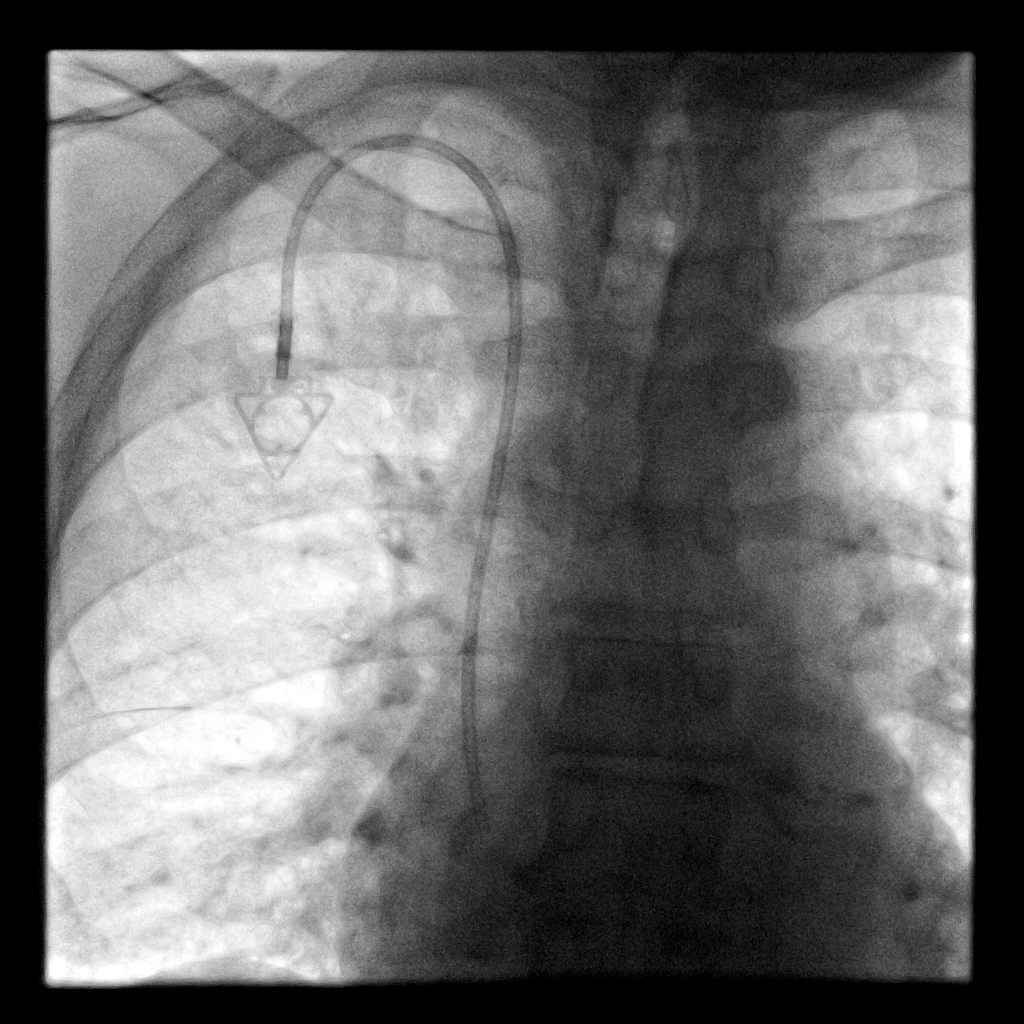

[1 of 1 positions shown; findings below may reference images not displayed]

EXAM:
RIGHT INTERNAL JUGULAR SINGLE LUMEN POWER PORT CATHETER INSERTION

Radiologist:  Hemoxa, Orlandiito

Guidance:  Ultrasound and fluoroscopic

MEDICATIONS:
Ancef 2 g; The antibiotic was administered within an appropriate
time interval prior to skin puncture.

ANESTHESIA/SEDATION:
Versed 2.0 mg IV; Fentanyl 100 mcg IV;

Moderate Sedation Time:  22 minutes

The patient was continuously monitored during the procedure by the
interventional radiology nurse under my direct supervision.

FLUOROSCOPY TIME:  0 minutes, 42 seconds (3 mGy)

COMPLICATIONS:
None immediate.

CONTRAST:  None.

PROCEDURE:
Informed consent was obtained from the patient following explanation
of the procedure, risks, benefits and alternatives. The patient
understands, agrees and consents for the procedure. All questions
were addressed. A time out was performed.

Maximal barrier sterile technique utilized including caps, mask,
sterile gowns, sterile gloves, large sterile drape, hand hygiene,
and 2% chlorhexidine scrub.

Under sterile conditions and local anesthesia, right internal
jugular micropuncture venous access was performed. Access was
performed with ultrasound. Images were obtained for documentation of
the patent right internal jugular vein. A guide wire was inserted
followed by a transitional dilator. This allowed insertion of a
guide wire and catheter into the IVC. Measurements were obtained
from the SVC / RA junction back to the right IJ venotomy site. In
the right infraclavicular chest, a subcutaneous pocket was created
over the second anterior rib. This was done under sterile conditions
and local anesthesia. 1% lidocaine with epinephrine was utilized for
this. A 2.5 cm incision was made in the skin. Blunt dissection was
performed to create a subcutaneous pocket over the right pectoralis
major muscle. The pocket was flushed with saline vigorously. There
was adequate hemostasis. The port catheter was assembled and checked
for leakage. The port catheter was secured in the pocket with two
retention sutures. The tubing was tunneled subcutaneously to the
right venotomy site and inserted into the SVC/RA junction through a
valved peel-away sheath. Position was confirmed with fluoroscopy.
Images were obtained for documentation. The patient tolerated the
procedure well. No immediate complications. Incisions were closed in
a two layer fashion with 4 - 0 Vicryl suture. Dermabond was applied
to the skin. The port catheter was accessed, blood was aspirated
followed by saline and heparin flushes. Needle was removed. A dry
sterile dressing was applied.
IMPRESSION: Ultrasound and fluoroscopically guided right internal jugular single
lumen power port catheter insertion. Tip in the SVC/RA junction.
Catheter ready for use.

## 2021-09-18 IMAGING — XA IR PERC PLACEMENT GASTROSTOMY
1 series · 4 of 4 positions shown · non-contrast
Comparison: none

INDICATION: Head neck squamous cell carcinoma

[Series 300: ir gastrostomy tube mod sed · 4 of 4 slices shown]
[im 1/4]
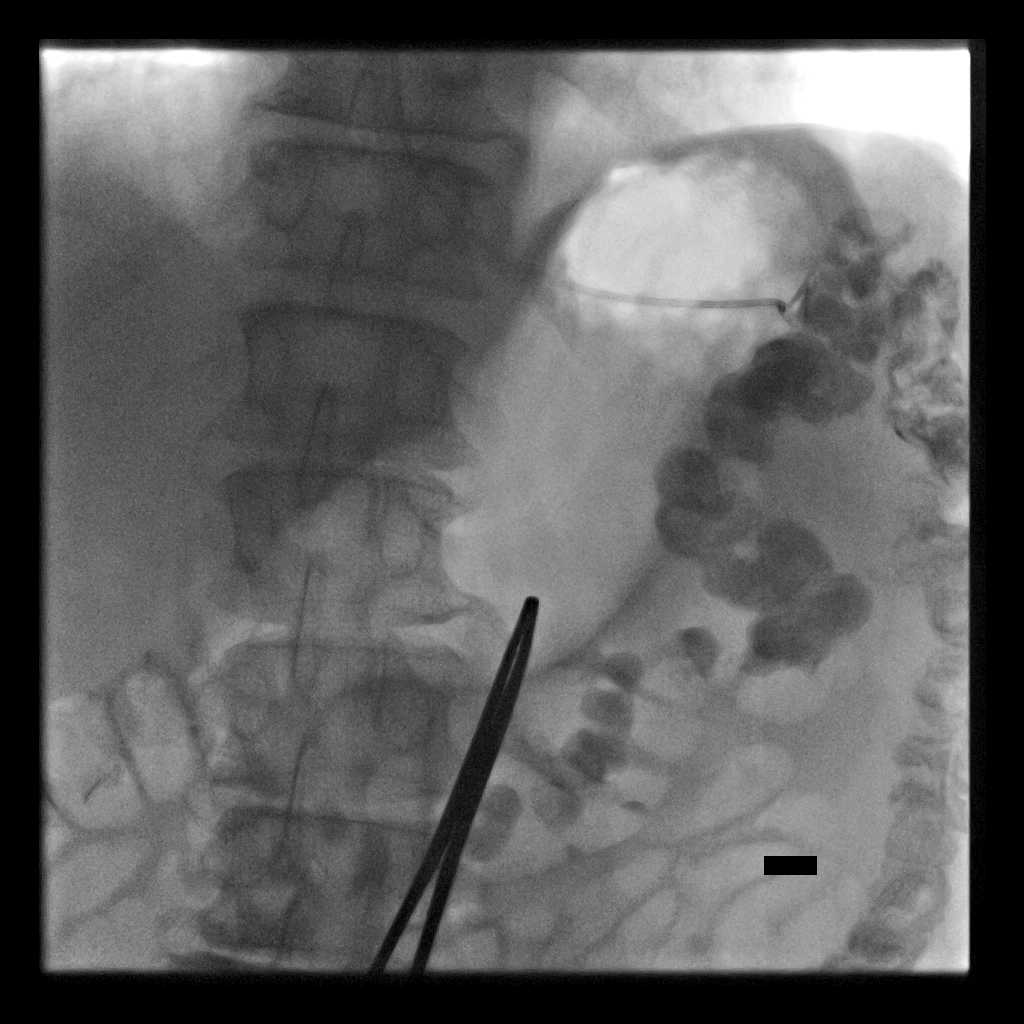
[im 2/4]
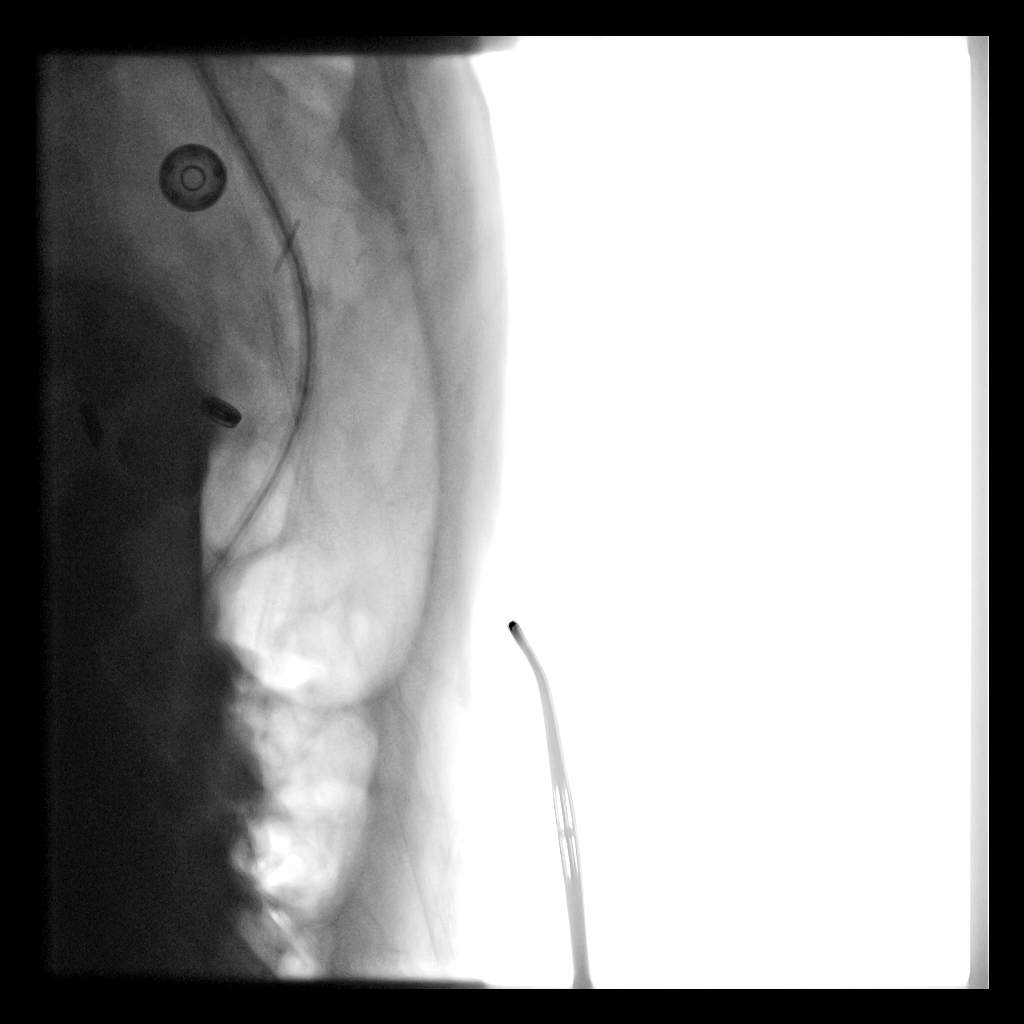
[im 3/4]
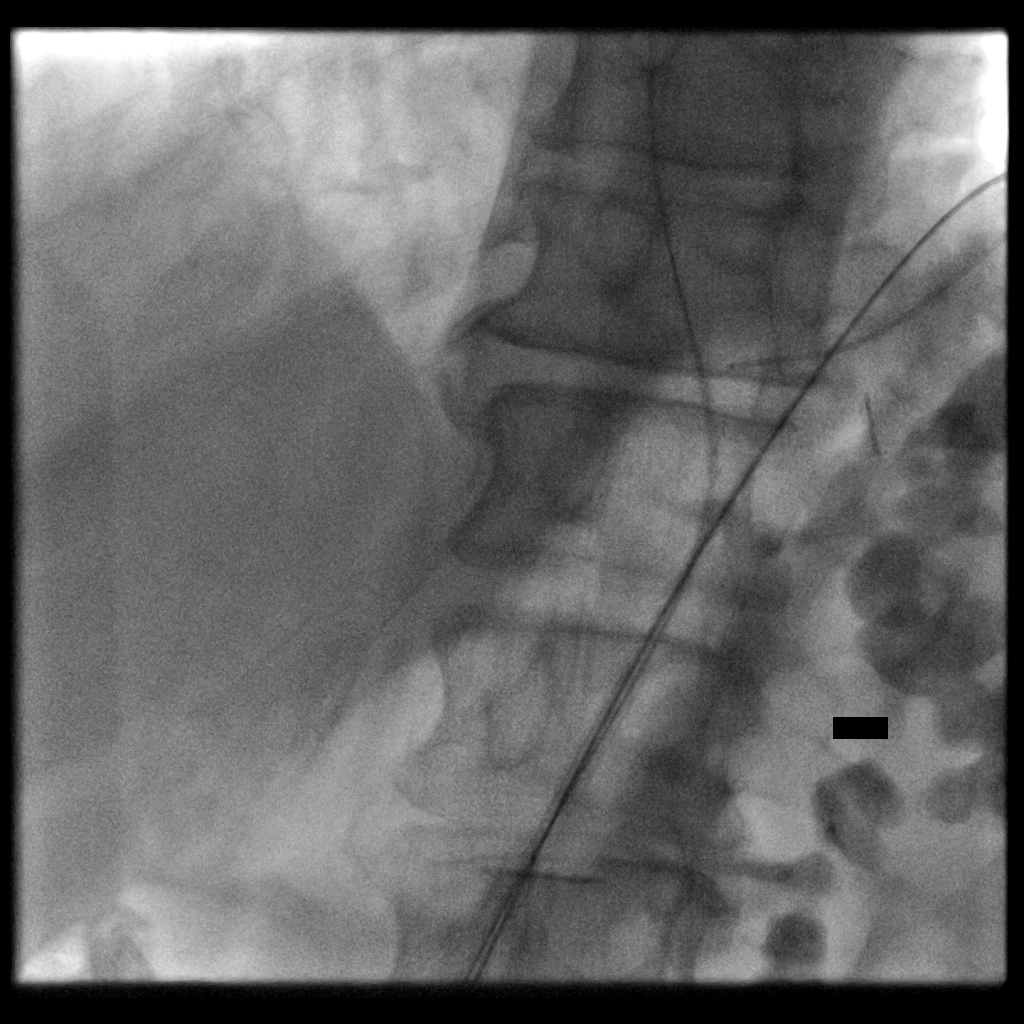
[im 4/4]
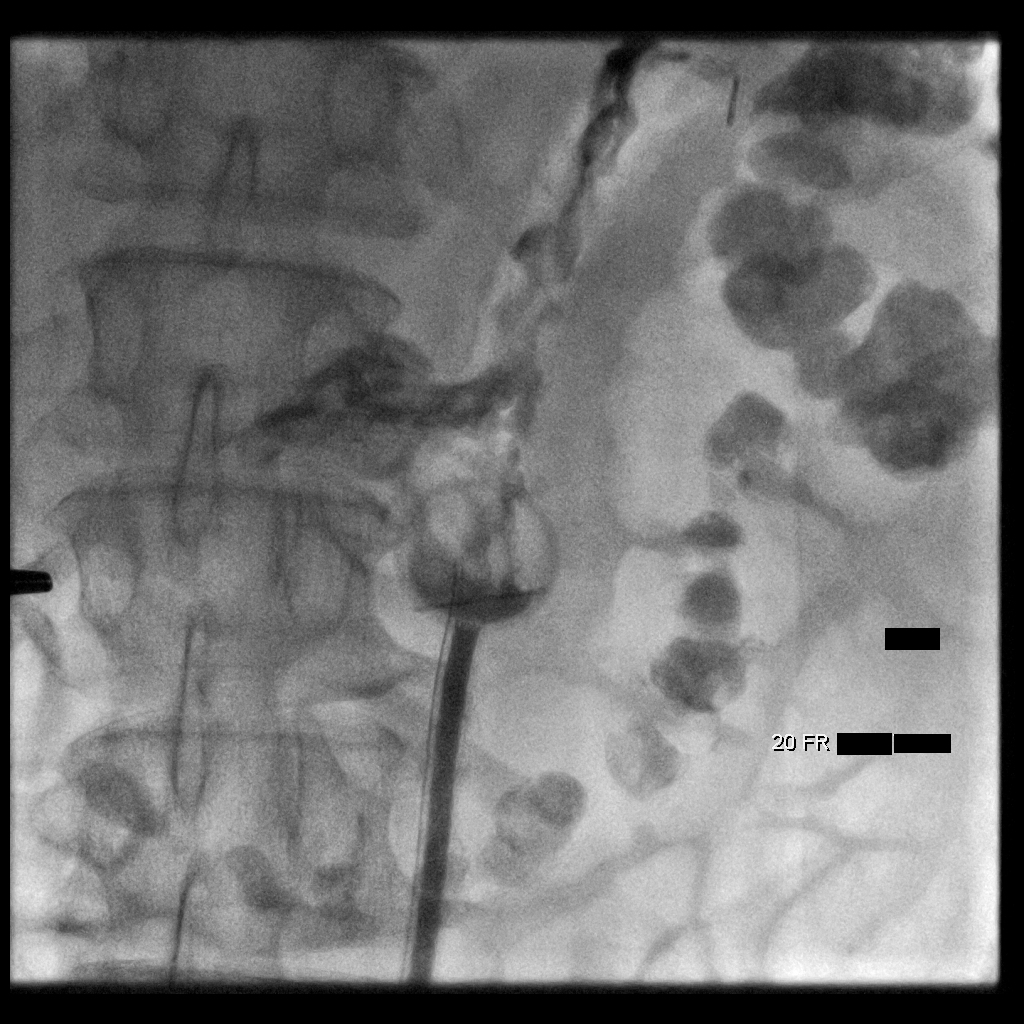

[4 of 4 positions shown; findings below may reference images not displayed]

EXAM:
FLUOROSCOPIC 20 FRENCH PULL-THROUGH GASTROSTOMY

Radiologist:  Tiger, Yoel

Guidance:  Fluoroscopic

MEDICATIONS:
Ancef 2 g; Antibiotics were administered within 1 hour of the
procedure. Because 0.5 mg IV

ANESTHESIA/SEDATION:
Versed 1.0 mg IV; Fentanyl 100 mcg IV

Moderate Sedation Time:  16 minutes

The patient was continuously monitored during the procedure by the
interventional radiology nurse under my direct supervision.

CONTRAST:  15mL OMNIPAQUE IOHEXOL 300 MG/ML SOLN - administered into
the gastric lumen.

FLUOROSCOPY TIME:  Fluoroscopy Time: 4.0 minutes 0 seconds (44 mGy).

COMPLICATIONS:
None immediate.

PROCEDURE:
Informed consent was obtained from the patient following explanation
of the procedure, risks, benefits and alternatives. The patient
understands, agrees and consents for the procedure. All questions
were addressed. A time out was performed.

Maximal barrier sterile technique utilized including caps, mask,
sterile gowns, sterile gloves, large sterile drape, hand hygiene,
and betadine prep.

The left upper quadrant was sterilely prepped and draped. An oral
gastric catheter was inserted into the stomach under fluoroscopy.
The existing nasogastric feeding tube was removed. Air was injected
into the stomach for insufflation and visualization under
fluoroscopy. The air distended stomach was confirmed beneath the
anterior abdominal wall in the frontal and lateral projections.
Under sterile conditions and local anesthesia, a 17 gauge trocar
needle was utilized to access the stomach percutaneously beneath the
left subcostal margin. Needle position was confirmed within the
stomach under biplane fluoroscopy. Contrast injection confirmed
position also. A single T tack was deployed for gastropexy. Over an
Amplatz guide wire, a 9-French sheath was inserted into the stomach.
A snare device was utilized to capture the oral gastric catheter.
The snare device was pulled retrograde from the stomach up the
esophagus and out the oropharynx. The 20-French pull-through
gastrostomy was connected to the snare device and pulled antegrade
through the oropharynx down the esophagus into the stomach and then
through the percutaneous tract external to the patient. The
gastrostomy was assembled externally. Contrast injection confirms
position in the stomach. Images were obtained for documentation. The
patient tolerated procedure well. No immediate complication.
IMPRESSION: Fluoroscopic insertion of a 20-French "pull-through" gastrostomy.

## 2021-09-18 MED ORDER — ATORVASTATIN CALCIUM 10 MG PO TABS
10.0000 mg | ORAL_TABLET | Freq: Every day | ORAL | 3 refills | Status: DC
  Filled 2021-09-18: qty 90, 90d supply, fill #0

## 2021-09-18 MED ORDER — ATORVASTATIN CALCIUM 10 MG PO TABS: 10.0000 mg | ORAL_TABLET | Freq: Every day | ORAL | 3 refills | Status: DC

## 2021-09-19 ENCOUNTER — Encounter: Payer: Self-pay | Admitting: Critical Care Medicine

## 2021-09-19 ENCOUNTER — Other Ambulatory Visit (HOSPITAL_COMMUNITY): Payer: Self-pay

## 2021-09-19 ENCOUNTER — Other Ambulatory Visit: Payer: Self-pay | Admitting: Critical Care Medicine

## 2021-09-19 DIAGNOSIS — E785 Hyperlipidemia, unspecified: Secondary | ICD-10-CM | POA: Insufficient documentation

## 2021-09-19 DIAGNOSIS — E039 Hypothyroidism, unspecified: Secondary | ICD-10-CM | POA: Insufficient documentation

## 2021-09-19 LAB — THYROID PANEL WITH TSH
Free Thyroxine Index: 0.6 — ABNORMAL LOW (ref 1.2–4.9)
T3 Uptake Ratio: 21 % — ABNORMAL LOW (ref 24–39)
T4, Total: 2.8 ug/dL — ABNORMAL LOW (ref 4.5–12.0)
TSH: 48.4 u[IU]/mL — ABNORMAL HIGH (ref 0.450–4.500)

## 2021-09-19 LAB — SPECIMEN STATUS REPORT

## 2021-09-19 MED ORDER — LEVOTHYROXINE SODIUM 75 MCG PO TABS
75.0000 ug | ORAL_TABLET | Freq: Every day | ORAL | 3 refills | Status: DC
  Filled 2021-09-19: qty 90, 90d supply, fill #0

## 2021-09-20 ENCOUNTER — Other Ambulatory Visit (HOSPITAL_COMMUNITY): Payer: Self-pay

## 2021-09-25 DIAGNOSIS — Z1211 Encounter for screening for malignant neoplasm of colon: Secondary | ICD-10-CM | POA: Diagnosis not present

## 2021-09-27 ENCOUNTER — Other Ambulatory Visit (HOSPITAL_COMMUNITY): Payer: Self-pay

## 2021-09-30 ENCOUNTER — Telehealth: Payer: Self-pay | Admitting: Critical Care Medicine

## 2021-09-30 ENCOUNTER — Other Ambulatory Visit: Payer: Self-pay | Admitting: Critical Care Medicine

## 2021-09-30 ENCOUNTER — Other Ambulatory Visit (HOSPITAL_COMMUNITY): Payer: Self-pay

## 2021-09-30 NOTE — Telephone Encounter (Signed)
Talked with pharmacy and pt, pt is aware of medication

## 2021-09-30 NOTE — Telephone Encounter (Signed)
Copied from [place] [id number]. Topic: General - Other >> Sep 30, 2021  9:21 AM Valere Dross wrote: Reason for CRM: Pt called in stating when contacted the pharmacy they told him it was to early to refill, but pt states he has never taken this medication before, and needs it refilled, please advise.   dicyclomine (BENTYL) 10 MG capsule

## 2021-09-30 NOTE — Telephone Encounter (Signed)
Medication Refill - Medication:  nicotine (NICODERM CQ - DOSED IN MG/24 HOURS) 14 mg/24hr patch   Has the patient contacted their pharmacy? Yes.   Contact PCP  Preferred Pharmacy (with phone number or street name):  [facility] [address], [place] [address]  Phone:  [phone]  Fax:  [phone]   Has the patient been seen for an appointment in the last year OR does the patient have an upcoming appointment? Yes.    Agent: Please be advised that RX refills may take up to 3 business days. We ask that you follow-up with your pharmacy.

## 2021-10-01 ENCOUNTER — Other Ambulatory Visit (HOSPITAL_COMMUNITY): Payer: Self-pay

## 2021-10-01 MED ORDER — NICOTINE 14 MG/24HR TD PT24
14.0000 mg | MEDICATED_PATCH | Freq: Every day | TRANSDERMAL | 2 refills | Status: AC
  Filled 2021-10-01: qty 28, 28d supply, fill #0
  Filled 2021-10-25: qty 28, 28d supply, fill #1

## 2021-10-01 NOTE — Telephone Encounter (Signed)
Requested Prescriptions  Pending Prescriptions Disp Refills   nicotine (NICODERM CQ - DOSED IN MG/24 HOURS) 14 mg/24hr patch 28 patch 0    Sig: Place 1 patch (14 mg total) onto the skin daily.     Psychiatry:  Drug Dependence Therapy Passed - 09/30/2021  1:52 PM      Passed - Valid encounter within last 12 months    Recent Outpatient Visits          2 weeks ago 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 [facility])   [place] Elsie Stain, MD   1 month ago 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 [facility])   [place], MD      Future Appointments            In 3 months Joya Gaskins Burnett Harry, MD [place]

## 2021-10-02 ENCOUNTER — Other Ambulatory Visit (HOSPITAL_COMMUNITY): Payer: Self-pay

## 2021-10-02 DIAGNOSIS — Z419 Encounter for procedure for purposes other than remedying health state, unspecified: Secondary | ICD-10-CM | POA: Diagnosis not present

## 2021-10-03 ENCOUNTER — Telehealth: Payer: Self-pay

## 2021-10-03 LAB — COLOGUARD: COLOGUARD: NEGATIVE

## 2021-10-03 NOTE — Progress Notes (Signed)
Let patient know cologuard is NEG. No colon cancer. Recheck 67yrs

## 2021-10-03 NOTE — Telephone Encounter (Signed)
Pt was called and is aware of results, DOB was confirmed.  ?

## 2021-10-03 NOTE — Telephone Encounter (Signed)
-----   Message from Casey Stain, MD sent at 10/03/2021 11:53 AM EST ----- ?Let patient know cologuard is NEG. No colon cancer. Recheck 39yrs ?

## 2021-10-14 ENCOUNTER — Inpatient Hospital Stay: Payer: Medicaid Other | Attending: Hematology and Oncology

## 2021-10-14 ENCOUNTER — Ambulatory Visit (HOSPITAL_COMMUNITY)
Admission: RE | Admit: 2021-10-14 | Discharge: 2021-10-14 | Disposition: A | Discharge status: Home / Self Care | Payer: Medicaid Other | Source: Ambulatory Visit | Attending: Radiation Oncology | Admitting: Radiation Oncology

## 2021-10-14 ENCOUNTER — Other Ambulatory Visit: Payer: Self-pay

## 2021-10-14 ENCOUNTER — Encounter (HOSPITAL_COMMUNITY): Payer: Self-pay

## 2021-10-14 DIAGNOSIS — Z79899 Other long term (current) drug therapy: Secondary | ICD-10-CM | POA: Insufficient documentation

## 2021-10-14 DIAGNOSIS — C7801 Secondary malignant neoplasm of right lung: Secondary | ICD-10-CM | POA: Insufficient documentation

## 2021-10-14 DIAGNOSIS — R911 Solitary pulmonary nodule: Secondary | ICD-10-CM | POA: Diagnosis not present

## 2021-10-14 DIAGNOSIS — C049 Malignant neoplasm of floor of mouth, unspecified: Secondary | ICD-10-CM | POA: Insufficient documentation

## 2021-10-14 DIAGNOSIS — R918 Other nonspecific abnormal finding of lung field: Secondary | ICD-10-CM | POA: Diagnosis not present

## 2021-10-14 DIAGNOSIS — C7802 Secondary malignant neoplasm of left lung: Secondary | ICD-10-CM | POA: Insufficient documentation

## 2021-10-14 DIAGNOSIS — C023 Malignant neoplasm of anterior two-thirds of tongue, part unspecified: Secondary | ICD-10-CM | POA: Insufficient documentation

## 2021-10-14 DIAGNOSIS — Z7952 Long term (current) use of systemic steroids: Secondary | ICD-10-CM | POA: Insufficient documentation

## 2021-10-16 ENCOUNTER — Inpatient Hospital Stay (HOSPITAL_BASED_OUTPATIENT_CLINIC_OR_DEPARTMENT_OTHER): Payer: Medicaid Other | Admitting: Hematology and Oncology

## 2021-10-16 ENCOUNTER — Encounter: Payer: Self-pay | Admitting: Hematology and Oncology

## 2021-10-16 ENCOUNTER — Ambulatory Visit: Payer: Medicaid Other | Admitting: Hematology and Oncology

## 2021-10-16 ENCOUNTER — Encounter: Payer: Self-pay | Admitting: Radiation Oncology

## 2021-10-16 ENCOUNTER — Inpatient Hospital Stay: Payer: Medicaid Other

## 2021-10-16 ENCOUNTER — Other Ambulatory Visit: Payer: Self-pay

## 2021-10-16 ENCOUNTER — Inpatient Hospital Stay: Payer: Medicaid Other | Admitting: Dietician

## 2021-10-16 ENCOUNTER — Ambulatory Visit
Admission: RE | Admit: 2021-10-16 | Discharge: 2021-10-16 | Disposition: A | Discharge status: Home / Self Care | Payer: Medicaid Other | Source: Ambulatory Visit | Attending: Radiation Oncology | Admitting: Radiation Oncology

## 2021-10-16 VITALS — BP 135/76 | HR 70 | Temp 97.2°F | Resp 18 | Ht 63.0 in | Wt 124.1 lb

## 2021-10-16 VITALS — BP 125/79 | HR 68 | Temp 98.8°F | Resp 18 | Ht 63.0 in | Wt 127.4 lb

## 2021-10-16 DIAGNOSIS — I2510 Atherosclerotic heart disease of native coronary artery without angina pectoris: Secondary | ICD-10-CM | POA: Insufficient documentation

## 2021-10-16 DIAGNOSIS — Z79899 Other long term (current) drug therapy: Secondary | ICD-10-CM | POA: Diagnosis not present

## 2021-10-16 DIAGNOSIS — C7801 Secondary malignant neoplasm of right lung: Secondary | ICD-10-CM

## 2021-10-16 DIAGNOSIS — C028 Malignant neoplasm of overlapping sites of tongue: Secondary | ICD-10-CM | POA: Insufficient documentation

## 2021-10-16 DIAGNOSIS — L989 Disorder of the skin and subcutaneous tissue, unspecified: Secondary | ICD-10-CM | POA: Insufficient documentation

## 2021-10-16 DIAGNOSIS — Z7952 Long term (current) use of systemic steroids: Secondary | ICD-10-CM | POA: Diagnosis not present

## 2021-10-16 DIAGNOSIS — Z79890 Hormone replacement therapy (postmenopausal): Secondary | ICD-10-CM | POA: Insufficient documentation

## 2021-10-16 DIAGNOSIS — Z87891 Personal history of nicotine dependence: Secondary | ICD-10-CM | POA: Insufficient documentation

## 2021-10-16 DIAGNOSIS — C023 Malignant neoplasm of anterior two-thirds of tongue, part unspecified: Secondary | ICD-10-CM

## 2021-10-16 DIAGNOSIS — C049 Malignant neoplasm of floor of mouth, unspecified: Secondary | ICD-10-CM | POA: Diagnosis not present

## 2021-10-16 DIAGNOSIS — C7802 Secondary malignant neoplasm of left lung: Secondary | ICD-10-CM | POA: Diagnosis not present

## 2021-10-16 DIAGNOSIS — H9201 Otalgia, right ear: Secondary | ICD-10-CM | POA: Insufficient documentation

## 2021-10-16 DIAGNOSIS — M47814 Spondylosis without myelopathy or radiculopathy, thoracic region: Secondary | ICD-10-CM | POA: Insufficient documentation

## 2021-10-16 DIAGNOSIS — R918 Other nonspecific abnormal finding of lung field: Secondary | ICD-10-CM | POA: Insufficient documentation

## 2021-10-16 DIAGNOSIS — J432 Centrilobular emphysema: Secondary | ICD-10-CM | POA: Insufficient documentation

## 2021-10-16 DIAGNOSIS — C7800 Secondary malignant neoplasm of unspecified lung: Secondary | ICD-10-CM | POA: Insufficient documentation

## 2021-10-16 NOTE — Progress Notes (Signed)
Casey [name] presents today for follow-up after completing radiation to his tongue/floor of mouth on 05/23/2020, and to review CT scan results from 10/14/2021 ? ?Pain issues, if any: Denies any mouth or throat pain. Does report occasional pain behind right ear (states it comes on abruptly but then resolves on its own quickly) ?Using a feeding tube?: N/A ?Weight changes, if any:  ?Wt Readings from Last 3 Encounters:  ?10/16/21 124 lb 2 oz (56.3 kg)  ?09/17/21 125 lb (56.7 kg)  ?07/19/21 119 lb (54 kg)  ? ?Swallowing issues, if any: Denies any new issues or concerns and reports a stable appetite  ?Smoking or chewing tobacco? None--wearing 7mg  Nicotine patch daily ?Using fluoride trays daily? N/A--working on finding a periodontist that accepts Medicaid  ?Last ENT visit was on: 10/18/2020 Saw [name] ?Other notable issues, if any: Continues to have stomach cramping related to levothyroxine (PCP aware). Continued dry mouth and thick saliva (managing with frequent sips of water). Scheduled for F/U with Dr. Chryl Heck later today ? ? ? ? ?

## 2021-10-16 NOTE — Progress Notes (Signed)
Nutrition Follow-up: ? ?Patient with floor of mouth cancer. He completed chemoradiation treatment with 3 weeks cisplatin and currently on surveillance. PEG removed (March 2022) ? ?Met with patient in radiation clinic. He reports good appetite and eating a variety of soft foods (BBQ pork, red beans, rice, eggs, sausage, fish sticks, soups, ice cream, cake). Patient continues to drink CIB with whole milk for added calories and protein. He denies dry mouth, thick saliva, altered taste, nausea, vomiting, diarrhea, constipation. ? ?Medications: reviewed ? ?Labs: reviewed ? ?Anthropometrics: Weight 127 lb 7 oz today increased ? ?2/14 - 125 lb ?12/16 - 119 lb ?11/15 - 122 lb ? ?NUTRITION DIAGNOSIS: Inadequate oral intake improved ? ? ?INTERVENTION:  ?Continue eating high calorie high protein foods for weight maintenance ?Continue drinking CIB for added calories and protein  ?  ? ?MONITORING, EVALUATION, GOAL: weight trends, intake  ? ? ?NEXT VISIT: No follow-up scheduled. Patient has contact information and will call if needed with nutrition questions/concerns.  ? ? ? ?

## 2021-10-16 NOTE — Progress Notes (Signed)
?Radiation Oncology         [phone] ?________________________________ ? ?Name: Casey [name] MRN: [id number]  ?Date: 10/16/2021  DOB: [birth year] ? ?Follow-Up Visit Note ? ?CC: Casey Stain, MD  Casey Lose, MD ? ?Diagnosis and Prior Radiotherapy:    C02.3 ?  ICD-10-CM   ?1. 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  ?2. Malignant neoplasm metastatic to both lungs (HCC)  C78.01   ? C78.02   ?  ? ? 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 ? ? ?CHIEF COMPLAINT:  Here for follow-up and PET/CT results ? ?Narrative:   Casey [name] presents today for follow-up after completing radiation to his tongue/floor of mouth on 05/23/2020, and to review CT scan results from 10/14/2021 ? ?Pain issues, if any: Denies any mouth or throat pain. Does report occasional pain behind right ear (states it comes on abruptly but then resolves on its own quickly) ?Using a feeding tube?: N/A ?Weight changes, if any:  ?Wt Readings from Last 3 Encounters:  ?10/16/21 124 lb 2 oz (56.3 kg)  ?09/17/21 125 lb (56.7 kg)  ?07/19/21 119 lb (54 kg)  ? ?Swallowing issues, if any: Denies any new issues or concerns and reports a stable appetite  ?Smoking or chewing tobacco? None--wearing 7mg  Nicotine patch daily ?Using fluoride trays daily? N/A--working on finding a periodontist that accepts Medicaid  ?Last ENT visit was on: 10/18/2020 Saw [name] ?Other notable issues, if any: Continues to have stomach cramping related to levothyroxine (PCP aware). Continued dry mouth and thick saliva (managing with frequent sips of water). Scheduled for F/U with Dr. Chryl Heck later today ? ? ?ALLERGIES:  has No Known Allergies. ? ?Meds: ?Current Outpatient Medications  ?Medication Sig Dispense Refill  ?  atorvastatin (LIPITOR) 10 MG tablet Take 1 tablet (10 mg total) by mouth daily. 90 tablet 3  ? buPROPion (WELLBUTRIN XL) 150 MG 24 hr tablet Take 1 tablet by mouth daily. 60 tablet 1  ? dexamethasone (DECADRON) 1 MG tablet Take no more than 1 tablet every other day, PRN mouth swelling. 30 tablet 1  ? dicyclomine (BENTYL) 10 MG capsule Take 1 capsule (10 mg total) by mouth 3 (three) times daily as needed for spasms. 30 capsule 1  ? docusate (COLACE) 50 MG/5ML liquid Take by mouth every other day. (Patient not taking: Reported on 09/17/2021)    ? levothyroxine (SYNTHROID) 75 MCG tablet Take 1 tablet (75 mcg total) by mouth daily. 90 tablet 3  ? lidocaine (XYLOCAINE) 2 % solution MIX 1 PART 2% VISCOUS LIDOCAINE WITH 1 PART WATER. SWISH AND SPIT 10ML OF DILUTED MIXTURE TO NUMB MOUTH, UP TO 6 TIMES DAILY AS NEEDED 200 mL 4  ? Multiple Vitamin (MULTIVITAMIN) tablet Take 1 tablet by mouth daily. (Patient not taking: Reported on 09/17/2021)    ? nicotine (NICODERM CQ - DOSED IN MG/24 HOURS) 14 mg/24hr patch Place 1 patch (14 mg total) onto the skin daily. 28 patch 2  ? ?No current facility-administered medications for this encounter.  ? ? ?Physical Findings: ?The patient is in no acute distress. Patient is alert and oriented. ?Wt Readings from Last 3 Encounters:  ?10/16/21 124 lb 2 oz (56.3 kg)  ?09/17/21 125 lb (56.7 kg)  ?07/19/21 119  lb (54 kg)  ? ? height is 5\' 3"  (1.6 m) and weight is 124 lb 2 oz (56.3 kg). His temporal temperature is 97.2 ?F (36.2 ?C) (abnormal). His blood pressure is 135/76 and his pulse is 70. His respiration is 18 and oxygen saturation is 100%. Marland Kitchen  ?General: Alert and oriented, in no acute distress ?HEENT: Head is normocephalic. Extraocular movements are intact. Oropharynx is notable for no lesions in the upper throat.   No thrush.  Floor of mouth notable for necrotic tissue that is stable. Lower left lip has an erosive lesion, subcentimer. ?Skin: Skin in treatment fields shows satisfactory healing  -   intact. ?Neck:  Lymphedema of anterior neck present.  1 cm right submandibular mass appreciated consistent with calcified node on imaging ?CHEST CTAB ?HEART RRR ?PSYCH pleasant affect, alert and oriented ?Neuro: non focal ? ?Lab Findings: ?Lab Results  ?Component Value Date  ? WBC 6.5 09/17/2021  ? HGB 13.0 09/17/2021  ? HCT 37.2 (L) 09/17/2021  ? MCV 100 (H) 09/17/2021  ? PLT 266 09/17/2021  ? ? ?Lab Results  ?Component Value Date  ? TSH 48.400 (H) 09/17/2021  ? ?CMP  ?   ?Component Value Date/Time  ? NA 136 09/17/2021 1511  ? K 4.4 09/17/2021 1511  ? CL 95 (L) 09/17/2021 1511  ? CO2 26 09/17/2021 1511  ? GLUCOSE 64 (L) 09/17/2021 1511  ? GLUCOSE 78 06/17/2021 1142  ? BUN 9 09/17/2021 1511  ? CREATININE 0.78 09/17/2021 1511  ? CREATININE 0.82 06/17/2021 1142  ? CALCIUM 9.5 09/17/2021 1511  ? PROT 7.5 09/17/2021 1511  ? ALBUMIN 5.0 (H) 09/17/2021 1511  ? AST 30 09/17/2021 1511  ? AST 28 06/15/2020 0927  ? ALT 21 09/17/2021 1511  ? ALT 32 06/15/2020 0927  ? ALKPHOS 84 09/17/2021 1511  ? BILITOT 0.3 09/17/2021 1511  ? BILITOT 0.4 06/15/2020 0927  ? GFRNONAA >60 06/17/2021 1142  ? GFRAA >60 05/03/2020 0940  ? ? ?Radiographic Findings: ?CT Chest Wo Contrast ? ?Result Date: 10/15/2021 ?CLINICAL DATA:  Head and neck cancer staging. EXAM: CT CHEST WITHOUT CONTRAST TECHNIQUE: Multidetector CT imaging of the chest was performed following the standard protocol without IV contrast. RADIATION DOSE REDUCTION: This exam was performed according to the departmental dose-optimization program which includes automated exposure control, adjustment of the mA and/or kV according to patient size and/or use of iterative reconstruction technique. COMPARISON:  CT chest 06/17/2021 and 03/13/2021.  PET-CT 07/16/2021. FINDINGS: Cardiovascular: No acute vascular findings on noncontrast imaging. Extensive coronary artery atherosclerosis again noted with lesser involvement of the aorta and great vessels. The heart size is normal. There is no  pericardial effusion. Mediastinum/Nodes: There are no enlarged mediastinal, hilar or axillary lymph nodes.Hilar assessment is limited by the lack of intravenous contrast, although the hilar contours appear unchanged. The thyroid gland, trachea and esophagus demonstrate no significant findings. Lungs/Pleura: No pleural effusion or pneumothorax. Centrilobular emphysema with chronic central airway thickening. Multiple pulmonary nodules are again noted. Cavitary right upper lobe nodule measures 1.2 x 0.8 cm on image 40/6 compared with 0.9 x 0.7 cm on CT 06/17/2021. Left upper lobe nodule measures 0.9 x 0.7 cm on image 49/6 (previously 0.4 cm). There is a subpleural nodule in the right lower lobe which measures 1.1 x 0.7 cm on image 203/6 (previously 1.1 x 0.6 cm). There is a ground-glass nodule in the lingula which measures 0.9 cm on image 168/6 which has not significantly changed. There is a new 5 mm solid  subpleural nodule in the left lower lobe on image 172/6. There are new patchy ground-glass opacities in the left upper lobe and right lower lobe which appear inflammatory. Scattered calcified granulomas are noted. Upper abdomen: The visualized upper abdomen appears stable without significant findings. Musculoskeletal/Chest wall: There is no chest wall mass or suspicious osseous finding. Mild thoracic spondylosis. IMPRESSION: 1. Several enlarging pulmonary nodules are noted in both lungs consistent with metastatic disease. 2. In addition, there are patchy ground-glass opacities in the left upper lobe and right lower lobe which are likely inflammatory. 3. No thoracic adenopathy, pleural or pericardial effusion. 4. Coronary and aortic atherosclerosis (ICD10-I70.0). Emphysema (ICD10-J43.9). Electronically Signed   By: Richardean Sale M.D.   On: 10/15/2021 09:46   ? ? ?Impression/Plan:   ? ?1) Head and Neck Cancer Status:  He has at lest 3 dense, slightly progressive, lung lesions reviewed at tumor board today, suspicious  for metastases.  We discusssed SBRT vs observation vs systemic therapy. We discussed pros and cons of each option. He sees Dr Chryl Heck today and is leaning towards close observation with her.  He understand

## 2021-10-16 NOTE — Progress Notes (Signed)
? ?Patient Care Team: ?Elsie Stain, MD as PCP - General (Pulmonary Disease) ?Malmfelt, Stephani Police, RN as Oncology Nurse Navigator ?Eppie Gibson, MD as Attending Physician (Radiation Oncology) ?Nicholas Lose, MD as Consulting Physician (Hematology and Oncology) ?Sharen Counter, CCC-SLP as Psychologist, clinical (Speech Pathology) ?Wynelle Beckmann, Melodie Bouillon, PT as Physical Therapist (Physical Therapy) ?Karie Mainland, RD as Dietitian (Nutrition) ?Karie Mainland, RD as Dietitian (Nutrition) ?Beverely Pace, LCSW (Inactive) as Education officer, museum Hotel manager) ? ?DIAGNOSIS:  ?  ICD-10-CM   ?1. 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  ? ? 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 In March 2021 he underwent dental etxractions and his dentists referred him for evaluation. 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 CT maxillofacial on 12/22/19 showed postsurgical changes and mandibular invasion, with cervical lymphadenopathy consistent with metastatic nodes. PET scan on 02/16/20 showed the tongue mass with lymph node metastases on the right floor of the mouth and cervical lymph nodes. ?  ?03/07/2020 Cancer Staging  ? Staging form: Oral Cavity, AJCC 8th Edition ?- Clinical: Stage IVA (cT4a, cN2b, cM0) - Signed by Nicholas Lose, MD on 03/07/2020 ? ?  ?04/06/2020 -  Chemotherapy  ? The patient had dexamethasone (DECADRON) 4 MG tablet, 8 mg, Oral, Daily, 1 of 1 cycle, Start date: 04/26/2020, End date: -- ?palonosetron (ALOXI) injection 0.25 mg, 0.25 mg, Intravenous,  Once, 3 of 3 cycles ?Administration: 0.25 mg (04/06/2020), 0.25 mg (04/26/2020), 0.25 mg (05/17/2020) ?CISplatin (PLATINOL) 165 mg in sodium chloride 0.9 % 500 mL chemo infusion, 100 mg/m2 = 165 mg, Intravenous,  Once, 3 of 3 cycles ?Dose  modification: 75 mg/m2 (original dose 100 mg/m2, Cycle 2, Reason: Provider Judgment) ?Administration: 165 mg (04/06/2020), 124 mg (04/26/2020), 124 mg (05/17/2020) ?fosaprepitant (EMEND) 150 mg in sodium chloride 0.9 % 145 mL IVPB, 150 mg, Intravenous,  Once, 3 of 3 cycles ?Administration: 150 mg (04/06/2020), 150 mg (04/26/2020), 150 mg (05/17/2020) ? ? for chemotherapy treatment.  ? ?  ? ? ?CHIEF COMPLIANT: Follow-up of floor of the mouth cancer ? ?INTERVAL HISTORY: Casey [name] is a 55 y.o. with above-mentioned history of floor of the mouth cancer who completed treatment with concurrent chemoradiation treatment with every 3 week cisplatin who is now on surveillance. He most recently had imaging which showed some concern for metastatic disease. ? ?He is eating better, pain is better. He is not keen on biopsy or immediate treatment. ?He was hoping to monitor since they are slow growing. Tongue swelling is better. ?He gained 7 lbs since he last saw [place]. No more mouth pain ?No cough,chest pain, SOB except for baseline cough. No hemoptysis. ?No change in bowel habits or urinary habits ?No headaches, falls, balance issues. ?Rest of the pertinent 10 point ROS reviewed and negative. ? ?ALLERGIES:  has No Known Allergies. ? ?MEDICATIONS:  ?Current Outpatient Medications  ?Medication Sig Dispense Refill  ? atorvastatin (LIPITOR) 10 MG tablet Take 1 tablet (10 mg total) by mouth daily. 90 tablet 3  ? buPROPion (WELLBUTRIN XL) 150 MG 24 hr tablet Take 1 tablet by mouth daily. 60 tablet 1  ? dexamethasone (DECADRON) 1 MG tablet Take no more than 1 tablet every other day, PRN mouth swelling. 30 tablet 1  ? dicyclomine (BENTYL) 10 MG capsule  Take 1 capsule (10 mg total) by mouth 3 (three) times daily as needed for spasms. 30 capsule 1  ? docusate (COLACE) 50 MG/5ML liquid Take by mouth every other day. (Patient not taking: Reported on 09/17/2021)    ? levothyroxine (SYNTHROID) 75 MCG tablet Take 1 tablet (75 mcg total) by  mouth daily. 90 tablet 3  ? lidocaine (XYLOCAINE) 2 % solution MIX 1 PART 2% VISCOUS LIDOCAINE WITH 1 PART WATER. SWISH AND SPIT 10ML OF DILUTED MIXTURE TO NUMB MOUTH, UP TO 6 TIMES DAILY AS NEEDED 200 mL 4  ? Multiple Vitamin (MULTIVITAMIN) tablet Take 1 tablet by mouth daily. (Patient not taking: Reported on 09/17/2021)    ? nicotine (NICODERM CQ - DOSED IN MG/24 HOURS) 14 mg/24hr patch Place 1 patch (14 mg total) onto the skin daily. 28 patch 2  ? ?No current facility-administered medications for this visit.  ? ? ?PHYSICAL EXAMINATION: ?ECOG PERFORMANCE STATUS: 1 - Symptomatic but completely ambulatory ? ?Vitals:  ? 10/16/21 1248  ?BP: 125/79  ?Pulse: 68  ?Resp: 18  ?Temp: 98.8 ?F (37.1 ?C)  ?SpO2: 100%  ? ?Filed Weights  ? 10/16/21 1248  ?Weight: 127 lb 7 oz (57.8 kg)  ? ?Physical Exam ?Constitutional:   ?   General: He is not in acute distress. ?   Appearance: Normal appearance. He is not ill-appearing.  ?HENT:  ?   Head: Normocephalic and atraumatic.  ?   Mouth/Throat:  ?   Mouth: Mucous membranes are moist.  ?   Pharynx: No oropharyngeal exudate or posterior oropharyngeal erythema.  ?   Comments: Tongue swollen but no palpable mass ?Neck:  ?   Comments: Lymphedema noted. ?Cardiovascular:  ?   Rate and Rhythm: Normal rate and regular rhythm.  ?   Pulses: Normal pulses.  ?   Heart sounds: Normal heart sounds.  ?Pulmonary:  ?   Effort: Pulmonary effort is normal.  ?   Breath sounds: Normal breath sounds.  ?Abdominal:  ?   General: Abdomen is flat. Bowel sounds are normal.  ?Musculoskeletal:     ?   General: No swelling or tenderness. Normal range of motion.  ?Lymphadenopathy:  ?   Cervical: No cervical adenopathy.  ?Skin: ?   General: Skin is warm and dry.  ?Neurological:  ?   General: No focal deficit present.  ?   Mental Status: He is alert and oriented to person, place, and time.  ?Psychiatric:     ?   Mood and Affect: Mood normal.     ?   Behavior: Behavior normal.  ? ?LABORATORY DATA:  ?I have reviewed the  data as listed ?CMP Latest Ref Rng & Units 09/17/2021 06/17/2021 03/01/2021  ?Glucose 70 - 99 mg/dL 64(L) 78 -  ?BUN 6 - 24 mg/dL $Remove'9 6 9  'sjwevne$ ?Creatinine 0.76 - 1.27 mg/dL 0.78 0.82 0.86  ?Sodium 134 - 144 mmol/L 136 134(L) -  ?Potassium 3.5 - 5.2 mmol/L 4.4 4.1 -  ?Chloride 96 - 106 mmol/L 95(L) 96(L) -  ?CO2 20 - 29 mmol/L 26 27 -  ?Calcium 8.7 - 10.2 mg/dL 9.5 9.5 -  ?Total Protein 6.0 - 8.5 g/dL 7.5 - -  ?Total Bilirubin 0.0 - 1.2 mg/dL 0.3 - -  ?Alkaline Phos 44 - 121 IU/L 84 - -  ?AST 0 - 40 IU/L 30 - -  ?ALT 0 - 44 IU/L 21 - -  ? ? ?Lab Results  ?Component Value Date  ? WBC 6.5 09/17/2021  ? HGB  13.0 09/17/2021  ? HCT 37.2 (L) 09/17/2021  ? MCV 100 (H) 09/17/2021  ? PLT 266 09/17/2021  ? NEUTROABS 4.2 09/17/2021  ? ? ? ? ?ASSESSMENT & PLAN:  ?No problem-specific Assessment & Plan notes found for this encounter. ? ?1. SCC of floor of mouth,  ?T4AN2B M0 stage IVa clinical stage ?  ?PET scan on 02/16/20 showed the tongue mass with lymph node metastases on the right floor of the mouth and cervical lymph nodes. ?CK7 and CD 117 are negative, p63 and CK 5/6 are positive, p16 is negative ?Completed 3 Cycles of cisplatin (started 04/06/2020) with XRT ? ?PET CT scan 08/28/2020: Increased tongue hypermetabolism SUV 15.2 more diffuse including posteriorly suggestive of progressive local disease.  Resolution of thoracic nodal hypermetabolism.  No evidence of extrathoracic metastases. ?Repeat biopsy showed  Focal necroinflammatory debris.  No carcinoma identified.  No  ?squamous mucosa present for evaluation.  ? ?He had CT chest in November 2022 which showed multiple new small pulmonary nodules consistent with pulmonary metastatic disease.  CT soft tissue neck showed satisfactory posttreatment appearance of the neck. ?PET/CT done in December 2022 showed FDG avid pulmonary nodules in the upper lobes with enlarging base nodule in the right lung base suspicious for metastatic disease.  [name] met him in December 2022 to discuss the  imaging findings discussed about offering SBRT versus biopsy versus referral for systemic therapy.  He elected to monitor.  He most recently had imaging which showed several enlarging pulmonary nodules consi

## 2021-10-17 NOTE — Progress Notes (Signed)
Oncology Nurse Navigator Documentation  ? ?I met with Mr. Dorow after his follow up with Dr. Squire today. He is doing well and denies any concerns at this time. He knows to call or email me with any needs. ? ?Jennifer Malmfelt RN, BSN, OCN ?Head & Neck Oncology Nurse Navigator ?[facility] ?Phone # [phone]  ?Fax # [phone]  ?

## 2021-10-25 ENCOUNTER — Other Ambulatory Visit (HOSPITAL_COMMUNITY): Payer: Self-pay

## 2021-10-27 ENCOUNTER — Other Ambulatory Visit (HOSPITAL_COMMUNITY): Payer: Self-pay

## 2021-10-28 ENCOUNTER — Other Ambulatory Visit (HOSPITAL_COMMUNITY): Payer: Self-pay

## 2021-10-29 ENCOUNTER — Other Ambulatory Visit (HOSPITAL_COMMUNITY): Payer: Self-pay

## 2021-10-31 DIAGNOSIS — C049 Malignant neoplasm of floor of mouth, unspecified: Secondary | ICD-10-CM | POA: Diagnosis not present

## 2021-11-02 DIAGNOSIS — Z419 Encounter for procedure for purposes other than remedying health state, unspecified: Secondary | ICD-10-CM | POA: Diagnosis not present

## 2021-11-05 ENCOUNTER — Institutional Professional Consult (permissible substitution): Payer: Medicaid Other | Admitting: Plastic Surgery

## 2021-11-27 ENCOUNTER — Telehealth: Payer: Self-pay | Admitting: Critical Care Medicine

## 2021-11-27 DIAGNOSIS — E039 Hypothyroidism, unspecified: Secondary | ICD-10-CM

## 2021-11-27 NOTE — Telephone Encounter (Signed)
Copied from [place] [id number]. Topic: General - Inquiry ?>> Nov 27, 2021  1:55 PM Loma [place] wrote: ?Reason for CRM: Pt was kind of curious re his thyroid #'s. It is ok but are they just going to be cked at FU visit in June? They were chked in Jan. Pt states that not have any issues really but just curious if labs before FU were needed. Pls FU to advise at [phone] ?

## 2021-11-28 NOTE — Addendum Note (Signed)
Addended by: Asencion Noble E on: 11/28/2021 03:55 PM ? ? Modules accepted: Orders ? ?

## 2021-11-28 NOTE — Telephone Encounter (Signed)
Pt had low thyroid at visit in February, thyroid med was called in to take daily ? ?Next appt 4 months in June, I am ok ordering thyroid test now he can come in for lab only appt, order placed ?

## 2021-11-28 NOTE — Telephone Encounter (Addendum)
Called patient back to advise to speak with Dr. Joya Gaskins at appt in June.  ? ?He was taken by the reply writer stated, "that he had to wait until June to get the test".  He was informed he could make a sooner appt. He cut off the Probation officer,  ?No, no, no, no, no, no,no! He did not wish to continue the conversation. Wished writer a good day and disconnected the call.  ? ?Please advise otherwise ?

## 2021-11-29 NOTE — Telephone Encounter (Signed)
Called pt and appt made 

## 2021-12-02 ENCOUNTER — Other Ambulatory Visit (HOSPITAL_COMMUNITY): Payer: Self-pay

## 2021-12-02 DIAGNOSIS — Z419 Encounter for procedure for purposes other than remedying health state, unspecified: Secondary | ICD-10-CM | POA: Diagnosis not present

## 2021-12-03 ENCOUNTER — Other Ambulatory Visit (HOSPITAL_COMMUNITY): Payer: Self-pay

## 2021-12-05 ENCOUNTER — Other Ambulatory Visit: Payer: Medicaid Other

## 2021-12-10 ENCOUNTER — Ambulatory Visit: Payer: Medicaid Other | Attending: Critical Care Medicine

## 2021-12-10 DIAGNOSIS — E039 Hypothyroidism, unspecified: Secondary | ICD-10-CM | POA: Diagnosis not present

## 2021-12-11 ENCOUNTER — Other Ambulatory Visit (HOSPITAL_COMMUNITY): Payer: Self-pay

## 2021-12-11 ENCOUNTER — Other Ambulatory Visit: Payer: Self-pay | Admitting: Critical Care Medicine

## 2021-12-11 LAB — THYROID PANEL WITH TSH
Free Thyroxine Index: 1 — ABNORMAL LOW (ref 1.2–4.9)
T3 Uptake Ratio: 25 % (ref 24–39)
T4, Total: 3.8 ug/dL — ABNORMAL LOW (ref 4.5–12.0)
TSH: 12.4 u[IU]/mL — ABNORMAL HIGH (ref 0.450–4.500)

## 2021-12-11 MED ORDER — LEVOTHYROXINE SODIUM 100 MCG PO TABS
100.0000 ug | ORAL_TABLET | Freq: Every day | ORAL | 2 refills | Status: DC
  Filled 2021-12-11: qty 90, 90d supply, fill #0

## 2021-12-13 ENCOUNTER — Other Ambulatory Visit (HOSPITAL_COMMUNITY): Payer: Self-pay

## 2021-12-16 ENCOUNTER — Telehealth: Payer: Self-pay | Admitting: Licensed Clinical Social Worker
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
## 2021-12-27 ENCOUNTER — Other Ambulatory Visit (HOSPITAL_COMMUNITY): Payer: Self-pay

## 2022-01-02 DIAGNOSIS — Z419 Encounter for procedure for purposes other than remedying health state, unspecified: Secondary | ICD-10-CM | POA: Diagnosis not present

## 2022-01-07 ENCOUNTER — Institutional Professional Consult (permissible substitution): Payer: Medicaid Other | Admitting: Plastic Surgery

## 2022-01-14 ENCOUNTER — Ambulatory Visit: Payer: Medicaid Other | Admitting: Critical Care Medicine

## 2022-01-14 ENCOUNTER — Ambulatory Visit: Payer: Self-pay | Admitting: *Deleted

## 2022-01-14 NOTE — Telephone Encounter (Signed)
Summary: diarrhea and nausea   Pt called in to cancel appointment stated experiencing diarrhea and nausea pt stated this started today.   Pt seeking clinical advice.      Chief Complaint: pt missed appt due to awakening with diarrhea this morning Symptoms: are now gone, just a little nauseated Frequency: off and on, resting Pertinent Negatives: Patient denies fever or vomiting Disposition: [] ED /[] Urgent Care (no appt availability in office) / [] Appointment(In office/virtual)/ []  [facility]/ [] Home Care/ [] Refused Recommended Disposition /[] [facility]/ [x]  Follow-up with PCP Additional Notes: Pt missed his appt today, unable to reschedule, however, he has appt in Aug. He awakened with diarrhea which stopped earlier today. He states he is a little nauseated off and on. Is planning on going out later to get OTC meds.  Reason for Disposition  MILD-MODERATE diarrhea (e.g., 1-6 times / day more than normal)  Answer Assessment - Initial Assessment Questions 1. DIARRHEA SEVERITY: "How bad is the diarrhea?" "How many more stools have you had in the past 24 hours than normal?" Just a few this morning   - NO DIARRHEA (SCALE 0)   - MILD (SCALE 1-3): Few loose or mushy BMs; increase of 1-3 stools over normal daily number of stools; mild increase in ostomy output.   -  MODERATE (SCALE 4-7): Increase of 4-6 stools daily over normal; moderate increase in ostomy output. * SEVERE (SCALE 8-10; OR 'WORST POSSIBLE'): Increase of 7 or more stools daily over normal; moderate increase in ostomy output; incontinence.     Moderate but now it is gone 2. ONSET: "When did the diarrhea begin?"      This morning 3. BM CONSISTENCY: "How loose or watery is the diarrhea?"      Not watery 4. VOMITING: "Are you also vomiting?" If Yes, ask: "How many times in the past 24 hours?"      No, just nauseated 5. ABDOMINAL PAIN: "Are you having any abdominal pain?" If Yes, ask: "What does it feel like?"  (e.g., crampy, dull, intermittent, constant)      no 6. ABDOMINAL PAIN SEVERITY: If present, ask: "How bad is the pain?"  (e.g., Scale 1-10; mild, moderate, or severe)   - MILD (1-3): doesn't interfere with normal activities, abdomen soft and not tender to touch    - MODERATE (4-7): interferes with normal activities or awakens from sleep, abdomen tender to touch    - SEVERE (8-10): excruciating pain, doubled over, unable to do any normal activities       na 7. ORAL INTAKE: If vomiting, "Have you been able to drink liquids?" "How  11. OTHER SYMPTOMS: "Do you have any other symptoms?" (e.g., fever, blood in stool)       no 12. PREGNANCY: "Is there any chance you are pregnant?" "When was your last menstrual period?"       na  Protocols used: [facility]

## 2022-01-14 NOTE — Progress Notes (Incomplete)
Established Patient Office Visit  Subjective:  Patient ID: Casey [name], male    DOB: Sep 15, 1965  Age: 56 y.o. MRN: [id number]  CC: Primary care visit to establish  HPI Casey [name] presents for primary care visit to establish face-to-face.  Note last visit was January 10 and was a video visit.   08/13/21 Casey [name] presents for primary care to establish.  This patient originally was living in [facility] but then moved to be closer to his sister in June 2020.  Prior to this in the fall 2019 in [place] he developed sores in the base of his tongue with hoarseness and swallowing difficulty.  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  He was being recommended reconstructive surgery he did not wish to have this we moved to [place] to be with his sister in June 2020 in the [place] area.  It was that time he connected with otolaryngology Dr. [name].  He also connected with medical oncology and radiation oncology at [facility].  He has had extensive radiotherapy and chemotherapy treatments to the base of his tongue.  Multiple notes are in our [facility] health system which I have reviewed in detail.  See the [facility] health epic system for these notes.  Currently the patient's not undergoing treatment he has had ulcerations in the mouth it is slowly healed multiple biopsies taken of these were negative for recurrent cancer.  He did have a recent PET scan which shows nodules in the lung which are new and a pleural-based lesion as well.  These are being observed for now.  Note the patient now is edentulous and is looking for Medicaid excepting dentist for dentures.  Patient is still smoking 4 to 5 cigarettes daily and does use a 7 mg nicotine patch.   Is able to eat some meals at this time.  He has no other complaints.  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  He has had extensive treatments of this documented at our last visit.  Patient seen today face-to-face.  He has developed a lesion on his lip which she is concerned about.  Is been there for several months and is not healing.  Patient also request a Medicaid dentist.  Patient also complains of occasional muscle spasticity in the stomach area.  Patient also needs colon cancer screening and is agreeable to Cologuard screening. Patient declined the flu vaccine.  He is agreeable to a pneumonia vaccination. 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 ([place]) - Primary    Recurrent lip lesion which needs to be assessed  Patient is going to have serial x-rays of his chest he has small lung nodules which are not considered significant but could represent metastatic disease eventually      Malignant neoplasm of anterior two-thirds of tongue ([place])    Evidently no recurrence based on previous assessments status post treatment with radiotherapy and  chemotherapy  There is a new lesion on the lip which has been present for about 4 months I feel would be best biopsied I will send the patient to plastic surgery for this        Other   Former tobacco use    Patient has successfully quit smoking 2 weeks ago he is encouraged to maintain the use of bupropion which has been beneficial to his abstinence      Multiple lung nodules on CT    This is being monitored by oncology      Encounter for health-related screening    Patient will receive health screening labs at this visit      Relevant  Orders   Comprehensive metabolic panel   Lipid panel   CBC with Differential/Platelet   Thyroid Panel With TSH   Colon cancer screening    Patient agreeable to Cologuard screening      Relevant Orders   Cologuard   Lip lesion    Plan referral to plastic surgery for biopsy      Relevant Orders   Ambulatory referral to Plastic Surgery   Elevated TSH    The patient does not have a history of hypothyroidism symptoms however he has had extensive radiation to the head and neck and his TSH has been consistently greater than 25 this past fall I will order a complete thyroid panel he may yet require thyroid supplement      Relevant Orders   Thyroid Panel With TSH   Abdominal cramping    Exam is very unremarkable will give Bentyl as needed      Past Medical History:  Diagnosis Date  . Depression   . squamous cell ca of floor of mouth 12/2018    Past Surgical History:  Procedure Laterality Date  . EXCISION OF TONGUE LESION Left 09/17/2020   Procedure: Biopsy of Anterior Tongue;  Surgeon: [name], MD;  Location: [place];  Service: ENT;  Laterality: Left;  . IR GASTROSTOMY TUBE MOD SED  04/03/2020  . IR GASTROSTOMY TUBE REMOVAL  10/03/2020  . IR IMAGING GUIDED PORT INSERTION  04/03/2020  . IR REMOVAL TUN ACCESS W/ PORT W/O FL MOD SED  10/03/2020    No family history on file.  Social History   Socioeconomic History  . Marital status: Single    Spouse name: Not on file  . Number of children: Not on file  . Years of education: Not on file  . Highest education level: Not on file  Occupational History  . Not on file  Tobacco Use  . Smoking status: Every Day    Packs/day: 0.50    Types: Cigarettes  . Smokeless tobacco: Never  Vaping Use  . Vaping Use: Never used  Substance and Sexual Activity  . Alcohol use: Yes    Alcohol/week: 3.0 standard drinks of alcohol    Types: 3 Cans of beer per week    Comment: several beers "numbs my tongue"  . Drug use:  Never  . Sexual activity: Not Currently  Other Topics Concern  . Not on file  Social History Narrative  . Not on file   Social Determinants of Health   Financial Resource Strain: Not on file  Food Insecurity: Not on file  Transportation Needs: Not on file  Physical Activity: Not on file  Stress: Not on file  Social Connections: Not on file  Intimate Partner Violence: Not on file    Outpatient Medications Prior to  Visit  Medication Sig Dispense Refill  . atorvastatin (LIPITOR) 10 MG tablet Take 1 tablet (10 mg total) by mouth daily. 90 tablet 3  . buPROPion (WELLBUTRIN XL) 150 MG 24 hr tablet Take 1 tablet by mouth daily. 60 tablet 1  . dexamethasone (DECADRON) 1 MG tablet Take no more than 1 tablet every other day, PRN mouth swelling. 30 tablet 1  . dicyclomine (BENTYL) 10 MG capsule Take 1 capsule (10 mg total) by mouth 3 (three) times daily as needed for spasms. 30 capsule 1  . levothyroxine (SYNTHROID) 100 MCG tablet Take 1 tablet (100 mcg total) by mouth daily. 90 tablet 2  . lidocaine (XYLOCAINE) 2 % solution MIX 1 PART 2% VISCOUS LIDOCAINE WITH 1 PART WATER. SWISH AND SPIT 10ML OF DILUTED MIXTURE TO NUMB MOUTH, UP TO 6 TIMES DAILY AS NEEDED 200 mL 4  . Multiple Vitamin (MULTIVITAMIN) tablet Take 1 tablet by mouth daily. (Patient not taking: Reported on 09/17/2021)     No facility-administered medications prior to visit.    No Known Allergies  ROS Review of Systems  Constitutional: Negative.  Negative for fever.  HENT:  Positive for trouble swallowing. Negative for ear pain, postnasal drip, rhinorrhea, sinus pressure, sore throat and voice change.        Lip lesion not healing  Eyes: Negative.   Respiratory: Negative.  Negative for apnea, cough, choking, chest tightness, shortness of breath, wheezing and stridor.   Cardiovascular: Negative.  Negative for chest pain, palpitations and leg swelling.  Gastrointestinal: Negative.  Negative for abdominal distention, abdominal  pain, nausea and vomiting.  Genitourinary: Negative.   Musculoskeletal: Negative.  Negative for arthralgias and myalgias.  Skin:  Positive for wound. Negative for rash.       Lip lesion not healing  Allergic/Immunologic: Negative.  Negative for environmental allergies and food allergies.  Neurological: Negative.  Negative for dizziness, syncope, weakness and headaches.  Hematological: Negative.  Negative for adenopathy. Does not bruise/bleed easily.  Psychiatric/Behavioral: Negative.  Negative for agitation, dysphoric mood, sleep disturbance and suicidal ideas. The patient is not nervous/anxious.       Objective:    Physical Exam Vitals reviewed.  Constitutional:      Appearance: Normal appearance. He is well-developed. He is not diaphoretic.  HENT:     Head: Normocephalic and atraumatic.     Nose: Nose normal. No nasal deformity, septal deviation, mucosal edema or rhinorrhea.     Right Sinus: No maxillary sinus tenderness or frontal sinus tenderness.     Left Sinus: No maxillary sinus tenderness or frontal sinus tenderness.     Mouth/Throat:     Pharynx: No oropharyngeal exudate.     Comments: Necrotic appearing tissue at the base of the tongue at the site of previous radiotherapy and chemotherapy.  This site has been biopsied extensively shows no evidence of cancer recurrence the patient is also edentulous Eyes:     General: No scleral icterus.    Conjunctiva/sclera: Conjunctivae normal.     Pupils: Pupils are equal, round, and reactive to light.  Neck:     Thyroid: No thyromegaly.     Vascular: No carotid bruit or JVD.     Trachea: Trachea normal. No tracheal tenderness or tracheal deviation.     Comments: The tissue at the top of the neck under the jaw is firm as evidence from previous radiotherapy Cardiovascular:     Rate and Rhythm: Normal rate and regular rhythm.     Chest Wall: PMI is  not displaced.     Pulses: Normal pulses. No decreased pulses.     Heart sounds:  Normal heart sounds, S1 normal and S2 normal. Heart sounds not distant. No murmur heard.    No systolic murmur is present.     No diastolic murmur is present.     No friction rub. No gallop. No S3 or S4 sounds.  Pulmonary:     Effort: No tachypnea, accessory muscle usage or respiratory distress.     Breath sounds: No stridor. No decreased breath sounds, wheezing, rhonchi or rales.  Chest:     Chest wall: No tenderness.  Abdominal:     General: Bowel sounds are normal. There is no distension.     Palpations: Abdomen is soft. Abdomen is not rigid.     Tenderness: There is no abdominal tenderness. There is no guarding or rebound.  Musculoskeletal:        General: Normal range of motion.     Cervical back: Normal range of motion. No edema, erythema or rigidity. No muscular tenderness. Normal range of motion.  Lymphadenopathy:     Head:     Right side of head: No submental or submandibular adenopathy.     Left side of head: No submental or submandibular adenopathy.     Cervical: No cervical adenopathy.  Skin:    General: Skin is warm and dry.     Coloration: Skin is not pale.     Findings: No rash.     Nails: There is no clubbing.  Neurological:     Mental Status: He is alert and oriented to person, place, and time.     Sensory: No sensory deficit.  Psychiatric:        Speech: Speech normal.        Behavior: Behavior normal.    There were no vitals taken for this visit. Wt Readings from Last 3 Encounters:  10/16/21 124 lb 2 oz (56.3 kg)  10/16/21 127 lb 7 oz (57.8 kg)  09/17/21 125 lb (56.7 kg)     Health Maintenance Due  Topic Date Due  . Zoster Vaccines- Shingrix (1 of 2) Never done  . COVID-19 Vaccine (4 - Booster for Pfizer series) 10/23/2020    There are no preventive care reminders to display for this patient.  Lab Results  Component Value Date   TSH 12.400 (H) 12/10/2021   Lab Results  Component Value Date   WBC 6.5 09/17/2021   HGB 13.0 09/17/2021   HCT  37.2 (L) 09/17/2021   MCV 100 (H) 09/17/2021   PLT 266 09/17/2021   Lab Results  Component Value Date   NA 136 09/17/2021   K 4.4 09/17/2021   CO2 26 09/17/2021   GLUCOSE 64 (L) 09/17/2021   BUN 9 09/17/2021   CREATININE 0.78 09/17/2021   BILITOT 0.3 09/17/2021   ALKPHOS 84 09/17/2021   AST 30 09/17/2021   ALT 21 09/17/2021   PROT 7.5 09/17/2021   ALBUMIN 5.0 (H) 09/17/2021   CALCIUM 9.5 09/17/2021   ANIONGAP 11 06/17/2021   EGFR 104 09/17/2021   Lab Results  Component Value Date   CHOL 255 (H) 09/17/2021   Lab Results  Component Value Date   HDL 79 09/17/2021   Lab Results  Component Value Date   LDLCALC 110 (H) 09/17/2021   Lab Results  Component Value Date   TRIG 388 (H) 09/17/2021   Lab Results  Component Value Date   CHOLHDL 3.2 09/17/2021   No  results found for: "HGBA1C"    Assessment & Plan:   Problem List Items Addressed This Visit   None  No orders of the defined types were placed in this encounter.  38 minutes spent with examining this patient multiple complex decisions made multiple referrals and assessments made Follow-up: No follow-ups on file.    Asencion Noble, MD

## 2022-01-15 ENCOUNTER — Encounter: Payer: Self-pay | Admitting: *Deleted

## 2022-01-15 NOTE — Telephone Encounter (Signed)
Noted will share with provider as too the reason

## 2022-01-15 NOTE — Telephone Encounter (Signed)
I contacted the patient and he is improving he will keep his August appointment  He will start vitamin B12  He apparently ate some leftover pizza that may have been tainted

## 2022-01-24 ENCOUNTER — Ambulatory Visit: Payer: Medicaid Other | Admitting: Hematology and Oncology

## 2022-01-25 ENCOUNTER — Other Ambulatory Visit: Payer: Self-pay | Admitting: Nurse Practitioner

## 2022-01-27 ENCOUNTER — Ambulatory Visit: Payer: Self-pay

## 2022-01-27 NOTE — Telephone Encounter (Signed)
Called pt and made appt for Monday

## 2022-01-27 NOTE — Telephone Encounter (Signed)
Ok to double book on Monday next week.  Or can send to mobile medicine Wednesday at [facility]  or go to urgent care

## 2022-01-27 NOTE — Telephone Encounter (Signed)
Routing to pcp for review 

## 2022-01-28 ENCOUNTER — Telehealth: Payer: Self-pay | Admitting: Critical Care Medicine

## 2022-01-28 NOTE — Telephone Encounter (Signed)
I called the patient this morning his nausea vomiting and diarrhea have all resolved since this past weekend.  He said it was a 4-day event.  He is now eating now and doing better.  I asked him to keep his appointment with me upcoming next week.  I do not think he needs to be seen sooner at this point.  He will call if his symptoms recur

## 2022-01-28 NOTE — Telephone Encounter (Signed)
Noted  

## 2022-01-30 ENCOUNTER — Other Ambulatory Visit: Payer: Self-pay

## 2022-01-30 DIAGNOSIS — C023 Malignant neoplasm of anterior two-thirds of tongue, part unspecified: Secondary | ICD-10-CM

## 2022-01-31 ENCOUNTER — Other Ambulatory Visit: Payer: Self-pay | Admitting: *Deleted

## 2022-01-31 ENCOUNTER — Inpatient Hospital Stay: Payer: Medicaid Other

## 2022-01-31 ENCOUNTER — Other Ambulatory Visit: Payer: Self-pay

## 2022-01-31 ENCOUNTER — Inpatient Hospital Stay: Payer: Medicaid Other | Attending: Hematology and Oncology | Admitting: Hematology and Oncology

## 2022-01-31 ENCOUNTER — Encounter: Payer: Self-pay | Admitting: Hematology and Oncology

## 2022-01-31 VITALS — BP 160/90 | HR 78 | Temp 99.7°F | Resp 18 | Ht 63.0 in | Wt 114.2 lb

## 2022-01-31 DIAGNOSIS — C023 Malignant neoplasm of anterior two-thirds of tongue, part unspecified: Secondary | ICD-10-CM | POA: Diagnosis not present

## 2022-01-31 DIAGNOSIS — C7802 Secondary malignant neoplasm of left lung: Secondary | ICD-10-CM | POA: Insufficient documentation

## 2022-01-31 DIAGNOSIS — C7801 Secondary malignant neoplasm of right lung: Secondary | ICD-10-CM | POA: Diagnosis not present

## 2022-01-31 LAB — CBC WITH DIFFERENTIAL (CANCER CENTER ONLY)
Abs Immature Granulocytes: 0.01 10*3/uL (ref 0.00–0.07)
Basophils Absolute: 0 10*3/uL (ref 0.0–0.1)
Basophils Relative: 1 %
Eosinophils Absolute: 0.1 10*3/uL (ref 0.0–0.5)
Eosinophils Relative: 1 %
HCT: 39.8 % (ref 39.0–52.0)
Hemoglobin: 13.9 g/dL (ref 13.0–17.0)
Immature Granulocytes: 0 %
Lymphocytes Relative: 15 %
Lymphs Abs: 0.9 10*3/uL (ref 0.7–4.0)
MCH: 33.5 pg (ref 26.0–34.0)
MCHC: 34.9 g/dL (ref 30.0–36.0)
MCV: 95.9 fL (ref 80.0–100.0)
Monocytes Absolute: 0.7 10*3/uL (ref 0.1–1.0)
Monocytes Relative: 11 %
Neutro Abs: 4.2 10*3/uL (ref 1.7–7.7)
Neutrophils Relative %: 72 %
Platelet Count: 193 10*3/uL (ref 150–400)
RBC: 4.15 MIL/uL — ABNORMAL LOW (ref 4.22–5.81)
RDW: 14.8 % (ref 11.5–15.5)
WBC Count: 5.8 10*3/uL (ref 4.0–10.5)
nRBC: 0 % (ref 0.0–0.2)

## 2022-01-31 LAB — CMP (CANCER CENTER ONLY)
ALT: 40 U/L (ref 0–44)
AST: 60 U/L — ABNORMAL HIGH (ref 15–41)
Albumin: 4.4 g/dL (ref 3.5–5.0)
Alkaline Phosphatase: 70 U/L (ref 38–126)
Anion gap: 7 (ref 5–15)
BUN: 8 mg/dL (ref 6–20)
CO2: 28 mmol/L (ref 22–32)
Calcium: 9.7 mg/dL (ref 8.9–10.3)
Chloride: 100 mmol/L (ref 98–111)
Creatinine: 0.91 mg/dL (ref 0.61–1.24)
GFR, Estimated: >60 mL/min (ref >60)
Glucose, Bld: 103 mg/dL — ABNORMAL HIGH (ref 70–99)
Potassium: 4.2 mmol/L (ref 3.5–5.1)
Sodium: 135 mmol/L (ref 135–145)
Total Bilirubin: 0.8 mg/dL (ref 0.3–1.2)
Total Protein: 7.5 g/dL (ref 6.5–8.1)

## 2022-01-31 NOTE — Progress Notes (Signed)
Patient Care Team: Elsie Stain, MD as PCP - General (Pulmonary Disease) Malmfelt, Stephani Police, RN as Oncology Nurse Navigator Eppie Gibson, MD as Attending Physician (Radiation Oncology) Nicholas Lose, MD as Consulting Physician (Hematology and Oncology) Sharen Counter, CCC-SLP as Speech Language Pathologist (Speech Pathology) Wynelle Beckmann, Melodie Bouillon, PT as Physical Therapist (Physical Therapy) Karie Mainland, RD as Dietitian (Nutrition) Karie Mainland, RD as Dietitian (Nutrition) Beverely Pace, LCSW (Inactive) as Social Worker (General Practice)  DIAGNOSIS:  No diagnosis found.   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 CT maxillofacial on 12/22/19 showed postsurgical changes and mandibular invasion, with cervical lymphadenopathy consistent with metastatic nodes. PET scan on 02/16/20 showed the tongue mass with lymph node metastases on the right floor of the mouth and cervical lymph nodes.   03/07/2020 Cancer Staging   Staging form: Oral Cavity, AJCC 8th Edition - Clinical: Stage IVA (cT4a, cN2b, cM0) - Signed by Nicholas Lose, MD on 03/07/2020   04/06/2020 - 05/17/2020 Chemotherapy   Patient is on Treatment Plan : HEAD/NECK Cisplatin + XRT q21d       CHIEF COMPLIANT: Follow-up of floor of the mouth cancer  INTERVAL HISTORY: Casey [name] is a 56 y.o. with above-mentioned history of floor of the mouth cancer who completed treatment with concurrent chemoradiation treatment with every 3 week cisplatin who is now on surveillance. He most recently had imaging which showed some concern for metastatic disease.  He is here for a 77-monthfollow-up.  He was supposed to repeat  imaging and return to clinic for follow-up.  He mentioned that no one called him to schedule however it appears that the scan was scheduled but he canceled it because he was not feeling well and did not reschedule. He complains of ongoing nausea and ongoing weight loss, he says the nausea is from the levothyroxine, he did not take it for the past 2 days and he feels remarkably well.  He lost weight because of the nausea.  He denies any worsening cough, chest pain or shortness of breath.  His last visit with Dr. RConstance Holsterwas back in March. Rest of the pertinent 10 point ROS reviewed and negative  ALLERGIES:  has No Known Allergies.  MEDICATIONS:  Current Outpatient Medications  Medication Sig Dispense Refill   atorvastatin (LIPITOR) 10 MG tablet Take 1 tablet (10 mg total) by mouth daily. 90 tablet 3   buPROPion (WELLBUTRIN XL) 150 MG 24 hr tablet Take 1 tablet by mouth daily. 60 tablet 1   dexamethasone (DECADRON) 1 MG tablet Take no more than 1 tablet every other day, PRN mouth swelling. 30 tablet 1   dicyclomine (BENTYL) 10 MG capsule Take 1 capsule (10 mg total) by mouth 3 (three) times daily as needed for spasms. 30 capsule 1   levothyroxine (SYNTHROID) 100 MCG tablet Take 1 tablet (100 mcg total) by mouth daily. 90 tablet 2   lidocaine (XYLOCAINE) 2 % solution MIX 1 PART 2% VISCOUS LIDOCAINE WITH 1 PART WATER. SWISH AND SPIT 10ML OF DILUTED MIXTURE TO NUMB MOUTH, UP TO 6 TIMES DAILY AS NEEDED 200 mL 4   Multiple Vitamin (MULTIVITAMIN) tablet Take 1 tablet by mouth daily.     No current  facility-administered medications for this visit.    PHYSICAL EXAMINATION: ECOG PERFORMANCE STATUS: 1 - Symptomatic but completely ambulatory  Vitals:   01/31/22 1313  BP: (!) 160/90  Pulse: 78  Resp: 18  Temp: 99.7 F (37.6 C)  SpO2: 100%   Filed Weights   01/31/22 1313  Weight: 114 lb 3.2 oz (51.8 kg)   Physical Exam Constitutional:      General: He is not in acute distress.    Appearance:  Normal appearance. He is not ill-appearing.  HENT:     Head: Normocephalic and atraumatic.     Mouth/Throat:     Mouth: Mucous membranes are moist.     Pharynx: No oropharyngeal exudate or posterior oropharyngeal erythema.     Comments: No mass in oral cavity noted Neck:     Comments: No palpable cervical masses, post treatment changes Cardiovascular:     Rate and Rhythm: Normal rate and regular rhythm.     Pulses: Normal pulses.     Heart sounds: Normal heart sounds.  Pulmonary:     Effort: Pulmonary effort is normal.     Breath sounds: Normal breath sounds.  Abdominal:     General: Abdomen is flat. Bowel sounds are normal.  Musculoskeletal:        General: No swelling or tenderness. Normal range of motion.  Lymphadenopathy:     Cervical: No cervical adenopathy.  Skin:    General: Skin is warm and dry.  Neurological:     General: No focal deficit present.     Mental Status: He is alert and oriented to person, place, and time.  Psychiatric:        Mood and Affect: Mood normal.        Behavior: Behavior normal.    LABORATORY DATA:  I have reviewed the data as listed    Latest Ref Rng & Units 01/31/2022   12:38 PM 09/17/2021    3:11 PM 06/17/2021   11:42 AM  CMP  Glucose 70 - 99 mg/dL 103  64  78   BUN 6 - 20 mg/dL 8  9  6   Creatinine 0.61 - 1.24 mg/dL 0.91  0.78  0.82   Sodium 135 - 145 mmol/L 135  136  134   Potassium 3.5 - 5.1 mmol/L 4.2  4.4  4.1   Chloride 98 - 111 mmol/L 100  95  96   CO2 22 - 32 mmol/L 28  26  27   Calcium 8.9 - 10.3 mg/dL 9.7  9.5  9.5   Total Protein 6.5 - 8.1 g/dL 7.5  7.5    Total Bilirubin 0.3 - 1.2 mg/dL 0.8  0.3    Alkaline Phos 38 - 126 U/L 70  84    AST 15 - 41 U/L 60  30    ALT 0 - 44 U/L 40  21      Lab Results  Component Value Date   WBC 5.8 01/31/2022   HGB 13.9 01/31/2022   HCT 39.8 01/31/2022   MCV 95.9 01/31/2022   PLT 193 01/31/2022   NEUTROABS 4.2 01/31/2022      ASSESSMENT & PLAN:  No problem-specific  Assessment & Plan notes found for this encounter.  1. SCC of floor of mouth,  [id number] M0 stage IVa clinical stage   PET scan on 02/16/20 showed the tongue mass with lymph node metastases on the right floor of the mouth and cervical lymph nodes. CK7 and CD 117 are negative, p63 and   CK 5/6 are positive, p16 is negative Completed 3 Cycles of cisplatin (started 04/06/2020) with XRT  PET CT scan 08/28/2020: Increased tongue hypermetabolism SUV 15.2 more diffuse including posteriorly suggestive of progressive local disease.  Resolution of thoracic nodal hypermetabolism.  No evidence of extrathoracic metastases. Repeat biopsy showed  Focal necroinflammatory debris.  No carcinoma identified.  No  squamous mucosa present for evaluation.   He had CT chest in November 2022 which showed multiple new small pulmonary nodules consistent with pulmonary metastatic disease.  CT soft tissue neck showed satisfactory posttreatment appearance of the neck.  PET/CT done in December 2022 showed FDG avid pulmonary nodules in the upper lobes with enlarging base nodule in the right lung base suspicious for metastatic disease.  [name] met him in December 2022 to discuss the imaging findings discussed about offering SBRT versus biopsy versus referral for systemic therapy.  He elected to monitor.    He then had imaging in March 2023 which showed several enlarging pulmonary nodules consistent with metastatic disease, patchy groundglass opacities in the left upper lobe and right lower lobe which are likely inflammatory.  He said he was not ready for a biopsy, he does not want to proceed with any treatment at this time but would rather monitor.  He understands that this could progress to widespread metastatic disease and may become symptomatic and hard to treat at a later date.    He was supposed to come back in 3 months with repeat scans.  He however had the scan scheduled but canceled since he was not feeling well.  He did not  reschedule this.  Have strongly encouraged him to get the scan scheduled as soon as possible.  I do not believe nausea and vomiting are related to his head and neck cancer.  He should talk to his PCP regarding the use of levothyroxine, we have discussed that this is a very important medication for his acquired hypothyroidism. We once again discussed if he however to have progressive disease, he should consider biopsy and treatment.  He will definitely think about this.  Return to clinic in 2 weeks, televisit to review imaging results and to discuss additional recommendations.  We have clearly discussed that lung nodules if biopsy proven needed that this is a stage IV head and neck cancer and it is not curable but only treatable with chemotherapy.   Total time spent: 30 mins including face to face time and time spent for planning, charting and coordination of care. I have independently reviewed most recent PET imaging.  Benay Pike, MD 01/31/2022

## 2022-02-01 DIAGNOSIS — Z419 Encounter for procedure for purposes other than remedying health state, unspecified: Secondary | ICD-10-CM | POA: Diagnosis not present

## 2022-02-02 NOTE — Progress Notes (Signed)
Established Patient Office Visit  Subjective:  Patient ID: Casey [name], male    DOB: Feb 05, 1966  Age: 56 y.o. MRN: [id number]  CC: Primary care visit to establish  HPI Casey [name] presents for primary care visit to establish face-to-face.  Note last visit was January 10 and was a video visit.   08/13/21 Casey [name] presents for primary care to establish.  This patient originally was living in [facility] but then moved to be closer to his sister in June 2020.  Prior to this in the fall 2019 in [place] he developed sores in the base of his tongue with hoarseness and swallowing difficulty.  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  He was being recommended reconstructive surgery he did not wish to have this we moved to [place] to be with his sister in June 2020 in the [place] area.  It was that time he connected with otolaryngology Dr. [name].  He also connected with medical oncology and radiation oncology at [facility] health.  He has had extensive radiotherapy and chemotherapy treatments to the base of his tongue.  Multiple notes are in our [facility] health system which I have reviewed in detail.  See the [facility] health epic system for these notes.  Currently the patient's not undergoing treatment he has had ulcerations in the mouth it is slowly healed multiple biopsies taken of these were negative for recurrent cancer.  He did have a recent PET scan which shows nodules in the lung which are new and a pleural-based lesion as well.  These are being observed for now.  Note the patient now is edentulous and is looking for Medicaid excepting dentist for dentures.  Patient is still smoking 4 to 5 cigarettes daily and does use a 7 mg nicotine patch.   Is able to eat some meals at this time.  He has no other complaints.  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  He has had extensive treatments of this documented at our last visit.  Patient seen today face-to-face.  He has developed a lesion on his lip which she is concerned about.  Is been there for several months and is not healing.  Patient also request a Medicaid dentist.  Patient also complains of occasional muscle spasticity in the stomach area.  Patient also needs colon cancer screening and is agreeable to Cologuard screening. Patient declined the flu vaccine.  He is agreeable to a pneumonia vaccination. Patient is no longer smoking cigarettes at this time and is using nicotine replacement The patient would like a dental examination to see if he is a candidate for dental implants The patient did successfully quit smoking using bupropion The patient has elevated TSH with previous radiation this needs to be further evaluated  02/03/22 The patient is experiencing increased nausea vomiting and episodes of diarrhea.  This seemed to occur when his dosage of levothyroxine was increased from 75 mcg to 100 mcg daily in May.  He still had elevated TSH levels and slightly low thyroid levels at that time.  However the patient simply not able to tolerate 100 mcg Synthroid dosing.  Symptoms worsened over the past week.  Patient was seen recently by oncology he has enlarging lung nodules they are recommending biopsy he wishes  to hold off and observe for now.  Concern is that he has metastatic cancer to the lung.  Patient is accompanied with his sister who also is advocating for the patient to hold off on biopsy of the lung nodules.  Patient's not taking any nutritional supplements is not eating well. Past Medical History:  Diagnosis Date   Depression    squamous cell ca of floor of mouth 12/2018    Past Surgical History:  Procedure Laterality Date   EXCISION OF TONGUE LESION Left 09/17/2020   Procedure: Biopsy of Anterior Tongue;   Surgeon: [name], MD;  Location: [place];  Service: ENT;  Laterality: Left;   IR GASTROSTOMY TUBE MOD SED  04/03/2020   IR GASTROSTOMY TUBE REMOVAL  10/03/2020   IR IMAGING GUIDED PORT INSERTION  04/03/2020   IR REMOVAL TUN ACCESS W/ PORT W/O FL MOD SED  10/03/2020    History reviewed. No pertinent family history.  Social History   Socioeconomic History   Marital status: Single    Spouse name: Not on file   Number of children: Not on file   Years of education: Not on file   Highest education level: Not on file  Occupational History   Not on file  Tobacco Use   Smoking status: Every Day    Packs/day: 0.50    Types: Cigarettes   Smokeless tobacco: Never  Vaping Use   Vaping Use: Never used  Substance and Sexual Activity   Alcohol use: Yes    Alcohol/week: 3.0 standard drinks of alcohol    Types: 3 Cans of beer per week    Comment: several beers "numbs my tongue"   Drug use: Never   Sexual activity: Not Currently  Other Topics Concern   Not on file  Social History Narrative   Not on file   Social Determinants of Health   Financial Resource Strain: Not on file  Food Insecurity: Not on file  Transportation Needs: Unmet Transportation Needs (01/29/2022)   PRAPARE - Hydrologist (Medical): Not on file    Lack of Transportation (Non-Medical): Yes  Physical Activity: Not on file  Stress: Not on file  Social Connections: Not on file  Intimate Partner Violence: Not on file    Outpatient Medications Prior to Visit  Medication Sig Dispense Refill   levothyroxine (SYNTHROID) 100 MCG tablet Take 1 tablet (100 mcg total) by mouth daily. 90 tablet 2   atorvastatin (LIPITOR) 10 MG tablet Take 1 tablet (10 mg total) by mouth daily. 90 tablet 3   buPROPion (WELLBUTRIN XL) 150 MG 24 hr tablet Take 1 tablet by mouth daily. 60 tablet 1   dexamethasone (DECADRON) 1 MG tablet Take no more than 1 tablet every other day, PRN mouth swelling. 30  tablet 1   dicyclomine (BENTYL) 10 MG capsule Take 1 capsule (10 mg total) by mouth 3 (three) times daily as needed for spasms. 30 capsule 1   lidocaine (XYLOCAINE) 2 % solution MIX 1 PART 2% VISCOUS LIDOCAINE WITH 1 PART WATER. SWISH AND SPIT 10ML OF DILUTED MIXTURE TO NUMB MOUTH, UP TO 6 TIMES DAILY AS NEEDED 200 mL 4   Multiple Vitamin (MULTIVITAMIN) tablet Take 1 tablet by mouth daily.     No facility-administered medications prior to visit.    No Known Allergies  ROS Review of Systems  Constitutional: Negative.  Negative for fever.  HENT:  Positive for trouble swallowing. Negative for ear pain, postnasal drip, rhinorrhea,  sinus pressure, sore throat and voice change.   Eyes: Negative.   Respiratory: Negative.  Negative for apnea, cough, choking, chest tightness, shortness of breath, wheezing and stridor.   Cardiovascular: Negative.  Negative for chest pain, palpitations and leg swelling.  Gastrointestinal:  Positive for diarrhea, nausea and vomiting. Negative for abdominal distention and abdominal pain.  Genitourinary: Negative.   Musculoskeletal: Negative.  Negative for arthralgias and myalgias.  Skin:  Negative for rash and wound.  Allergic/Immunologic: Negative.  Negative for environmental allergies and food allergies.  Neurological: Negative.  Negative for dizziness, syncope, weakness and headaches.  Hematological: Negative.  Negative for adenopathy. Does not bruise/bleed easily.  Psychiatric/Behavioral: Negative.  Negative for agitation, dysphoric mood, sleep disturbance and suicidal ideas. The patient is not nervous/anxious.       Objective:    Physical Exam Vitals reviewed.  Constitutional:      Appearance: He is well-developed. He is not diaphoretic.     Comments: Evidence of more weight loss he is down to 116 pounds, patient was 127 pounds in March  HENT:     Head: Normocephalic and atraumatic.     Nose: Nose normal. No nasal deformity, septal deviation, mucosal  edema or rhinorrhea.     Right Sinus: No maxillary sinus tenderness or frontal sinus tenderness.     Left Sinus: No maxillary sinus tenderness or frontal sinus tenderness.     Mouth/Throat:     Pharynx: No oropharyngeal exudate.     Comments: Necrotic appearing tissue at the base of the tongue at the site of previous radiotherapy and chemotherapy.  This site has been biopsied extensively shows no evidence of cancer recurrence the patient is also edentulous Eyes:     General: No scleral icterus.    Conjunctiva/sclera: Conjunctivae normal.     Pupils: Pupils are equal, round, and reactive to light.  Neck:     Thyroid: No thyromegaly.     Vascular: No carotid bruit or JVD.     Trachea: Trachea normal. No tracheal tenderness or tracheal deviation.     Comments: The tissue at the top of the neck under the jaw is firm as evidence from previous radiotherapy Cardiovascular:     Rate and Rhythm: Normal rate and regular rhythm.     Chest Wall: PMI is not displaced.     Pulses: Normal pulses. No decreased pulses.     Heart sounds: Normal heart sounds, S1 normal and S2 normal. Heart sounds not distant. No murmur heard.    No systolic murmur is present.     No diastolic murmur is present.     No friction rub. No gallop. No S3 or S4 sounds.  Pulmonary:     Effort: No tachypnea, accessory muscle usage or respiratory distress.     Breath sounds: No stridor. No decreased breath sounds, wheezing, rhonchi or rales.  Chest:     Chest wall: No tenderness.  Abdominal:     General: Bowel sounds are normal. There is no distension.     Palpations: Abdomen is soft. Abdomen is not rigid.     Tenderness: There is no abdominal tenderness. There is no guarding or rebound.  Musculoskeletal:        General: Normal range of motion.     Cervical back: Normal range of motion. No edema, erythema or rigidity. No muscular tenderness. Normal range of motion.  Lymphadenopathy:     Head:     Right side of head: No  submental or submandibular adenopathy.  Left side of head: No submental or submandibular adenopathy.     Cervical: No cervical adenopathy.  Skin:    General: Skin is warm and dry.     Coloration: Skin is not pale.     Findings: No rash.     Nails: There is no clubbing.  Neurological:     Mental Status: He is alert and oriented to person, place, and time.     Sensory: No sensory deficit.  Psychiatric:        Speech: Speech normal.        Behavior: Behavior normal.     BP 127/81   Pulse 76   Wt 116 lb (52.6 kg)   SpO2 96%   BMI 20.55 kg/m  Wt Readings from Last 3 Encounters:  02/03/22 116 lb (52.6 kg)  01/31/22 114 lb 3.2 oz (51.8 kg)  10/16/21 124 lb 2 oz (56.3 kg)     Health Maintenance Due  Topic Date Due   Zoster Vaccines- Shingrix (1 of 2) Never done   COVID-19 Vaccine (4 - Booster for Pfizer series) 10/23/2020    There are no preventive care reminders to display for this patient.  Lab Results  Component Value Date   TSH 12.400 (H) 12/10/2021   Lab Results  Component Value Date   WBC 5.8 01/31/2022   HGB 13.9 01/31/2022   HCT 39.8 01/31/2022   MCV 95.9 01/31/2022   PLT 193 01/31/2022   Lab Results  Component Value Date   NA 135 01/31/2022   K 4.2 01/31/2022   CO2 28 01/31/2022   GLUCOSE 103 (H) 01/31/2022   BUN 8 01/31/2022   CREATININE 0.91 01/31/2022   BILITOT 0.8 01/31/2022   ALKPHOS 70 01/31/2022   AST 60 (H) 01/31/2022   ALT 40 01/31/2022   PROT 7.5 01/31/2022   ALBUMIN 4.4 01/31/2022   CALCIUM 9.7 01/31/2022   ANIONGAP 7 01/31/2022   EGFR 104 09/17/2021   Lab Results  Component Value Date   CHOL 255 (H) 09/17/2021   Lab Results  Component Value Date   HDL 79 09/17/2021   Lab Results  Component Value Date   LDLCALC 110 (H) 09/17/2021   Lab Results  Component Value Date   TRIG 388 (H) 09/17/2021   Lab Results  Component Value Date   CHOLHDL 3.2 09/17/2021   No results found for: "HGBA1C"    Assessment & Plan:    Problem List Items Addressed This Visit       Respiratory   Malignant neoplasm metastatic to lung [facility])    Head neck cancer likely metastasized to the lung he wishes to hold off on biopsy at this time  Per oncology      Relevant Medications   ondansetron (ZOFRAN) 4 MG tablet     Digestive   RESOLVED: Lip lesion    Lesion on lip has resolved        Endocrine   Hypothyroidism (acquired)s/p XRT to mouth/neck    I think the most likely cause of the patient's hyperemesis and diarrhea nausea is over replacement from levothyroxine  We will reduce dosage to 75 mcg daily give him 25 mcg tablets and if he continues have nausea and diarrhea on the reduced dose he will reduce further to 50 mcg daily      Relevant Medications   levothyroxine (SYNTHROID) 25 MCG tablet     Other   Drug reaction - Primary    Adverse reaction to higher doses of levothyroxine will  reduce dose as per hypothyroidism assessment      RESOLVED: Abdominal cramping    Not currently having abdominal cramping      Meds ordered this encounter  Medications   levothyroxine (SYNTHROID) 25 MCG tablet    Sig: Take 3 tablets (75 mcg total) by mouth daily before breakfast. If nausea persists then reduce to 50 mcg 2 tablets daily    Dispense:  90 tablet    Refill:  2    Mail to pt , new dose,   ondansetron (ZOFRAN) 4 MG tablet    Sig: Take 1 tablet (4 mg total) by mouth every 8 (eight) hours as needed for nausea or vomiting.    Dispense:  40 tablet    Refill:  0    Mail to patient   Follow-up: Return in about 4 months (around 06/06/2022).    Asencion Noble, MD

## 2022-02-03 ENCOUNTER — Other Ambulatory Visit (HOSPITAL_COMMUNITY): Payer: Self-pay

## 2022-02-03 ENCOUNTER — Encounter: Payer: Self-pay | Admitting: Critical Care Medicine

## 2022-02-03 ENCOUNTER — Ambulatory Visit: Payer: Medicaid Other | Attending: Critical Care Medicine | Admitting: Critical Care Medicine

## 2022-02-03 VITALS — BP 127/81 | HR 76 | Wt 116.0 lb

## 2022-02-03 DIAGNOSIS — E039 Hypothyroidism, unspecified: Secondary | ICD-10-CM | POA: Diagnosis not present

## 2022-02-03 DIAGNOSIS — R109 Unspecified abdominal pain: Secondary | ICD-10-CM

## 2022-02-03 DIAGNOSIS — C7802 Secondary malignant neoplasm of left lung: Secondary | ICD-10-CM

## 2022-02-03 DIAGNOSIS — T50905A Adverse effect of unspecified drugs, medicaments and biological substances, initial encounter: Secondary | ICD-10-CM | POA: Insufficient documentation

## 2022-02-03 DIAGNOSIS — C7801 Secondary malignant neoplasm of right lung: Secondary | ICD-10-CM

## 2022-02-03 DIAGNOSIS — K130 Diseases of lips: Secondary | ICD-10-CM | POA: Diagnosis not present

## 2022-02-03 MED ORDER — LEVOTHYROXINE SODIUM 25 MCG PO TABS
75.0000 ug | ORAL_TABLET | Freq: Every day | ORAL | 2 refills | Status: DC
  Filled 2022-02-03: qty 90, 30d supply, fill #0
  Filled 2022-03-12: qty 90, 30d supply, fill #1
  Filled 2022-05-22: qty 90, 30d supply, fill #2

## 2022-02-03 MED ORDER — ONDANSETRON HCL 4 MG PO TABS
4.0000 mg | ORAL_TABLET | Freq: Three times a day (TID) | ORAL | 0 refills | Status: DC | PRN
  Filled 2022-02-03: qty 40, 14d supply, fill #0

## 2022-02-03 NOTE — Assessment & Plan Note (Signed)
I think the most likely cause of the patient's hyperemesis and diarrhea nausea is over replacement from levothyroxine  We will reduce dosage to 75 mcg daily give him 25 mcg tablets and if he continues have nausea and diarrhea on the reduced dose he will reduce further to 50 mcg daily

## 2022-02-03 NOTE — Assessment & Plan Note (Signed)
Lesion on lip has resolved

## 2022-02-03 NOTE — Assessment & Plan Note (Signed)
Adverse reaction to higher doses of levothyroxine will reduce dose as per hypothyroidism assessment

## 2022-02-03 NOTE — Patient Instructions (Signed)
Thyroid pill to 75 mcg daily, new prescription sent to [facility] will be mailed to you you will take 3 tablets of the 25 mcg daily if nausea persists then reduce to 50 mcg daily which would be 2 tablets  Zofran nausea medication will be also sent to take as needed  Use nutritional supplements as directed  If nausea persist message Dr. Joya Gaskins and we will get you to gastroenterology  Return for primary care follow-up 4 months

## 2022-02-03 NOTE — Assessment & Plan Note (Signed)
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

## 2022-02-03 NOTE — Assessment & Plan Note (Signed)
Not currently having abdominal cramping

## 2022-02-05 ENCOUNTER — Ambulatory Visit (HOSPITAL_COMMUNITY)
Admission: RE | Admit: 2022-02-05 | Discharge: 2022-02-05 | Disposition: A | Discharge status: Home / Self Care | Payer: Medicaid Other | Source: Ambulatory Visit | Attending: Hematology and Oncology | Admitting: Hematology and Oncology

## 2022-02-05 DIAGNOSIS — R918 Other nonspecific abnormal finding of lung field: Secondary | ICD-10-CM | POA: Diagnosis not present

## 2022-02-05 DIAGNOSIS — C7800 Secondary malignant neoplasm of unspecified lung: Secondary | ICD-10-CM | POA: Diagnosis not present

## 2022-02-05 DIAGNOSIS — Z7689 Persons encountering health services in other specified circumstances: Secondary | ICD-10-CM | POA: Diagnosis not present

## 2022-02-05 DIAGNOSIS — J432 Centrilobular emphysema: Secondary | ICD-10-CM | POA: Diagnosis not present

## 2022-02-05 DIAGNOSIS — C049 Malignant neoplasm of floor of mouth, unspecified: Secondary | ICD-10-CM | POA: Diagnosis not present

## 2022-02-05 MED ORDER — IOHEXOL 300 MG/ML  SOLN
100.0000 mL | Freq: Once | INTRAMUSCULAR | Status: AC | PRN
  Administered 2022-02-05: 100 mL via INTRAVENOUS

## 2022-02-07 ENCOUNTER — Other Ambulatory Visit (HOSPITAL_COMMUNITY): Payer: Self-pay

## 2022-02-12 IMAGING — CT NM PET TUM IMG RESTAG (PS) SKULL BASE T - THIGH
7 series · 25 of 25 positions shown · non-contrast
Comparison: Comparison outside PET of 12/14/2019

CLINICAL DATA: Subsequent treatment strategy for restaging of
squamous cell carcinoma of the floor of mouth. Second COVID vaccine
in left arm in [REDACTED].

EXAM:
NUCLEAR MEDICINE PET SKULL BASE TO THIGH
TECHNIQUE: 6.3 mCi F-18 FDG was injected intravenously. Full-ring PET imaging
was performed from the skull base to thigh after the radiotracer. CT
data was obtained and used for attenuation correction and anatomic
localization.
Fasting blood glucose: 89 mg/dl

[Series 3: pet hn_sk_thigh ac · axial · 5.0mm · 4.07mm/px · z∈[-792,+128]mm · 6 of 231 slices shown]
[im 1/231]
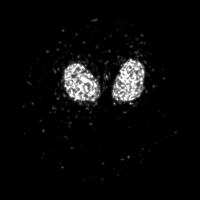
[im 47/231]
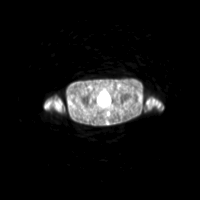
[im 93/231]
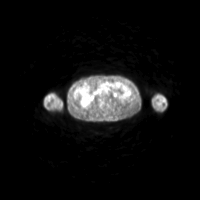
[im 139/231]
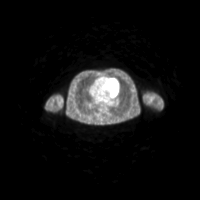
[im 185/231]
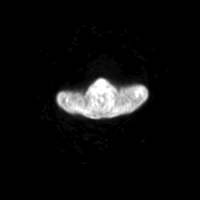
[im 231/231]
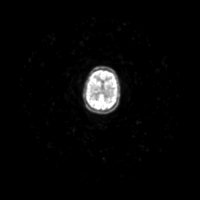

[Series 4: ct hn_sk_th 5.0 bf37 · axial · 5.0mm · 0.98mm/px · z∈[-792,+128]mm · 5 of 231 slices shown]
[im 1/231]
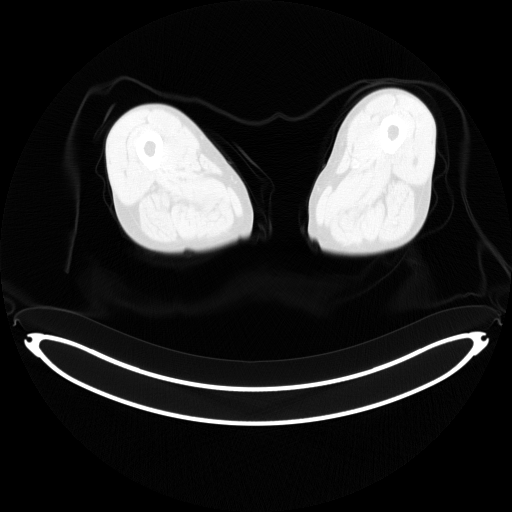
[im 58/231]
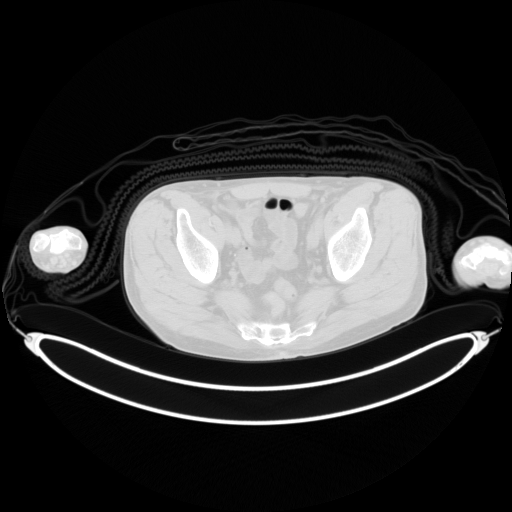
[im 116/231]
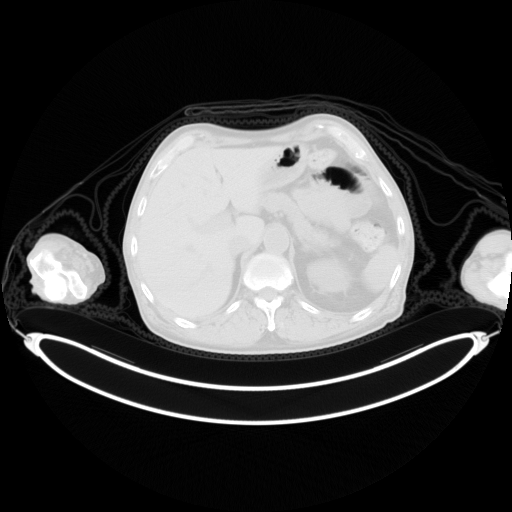
[im 173/231  brain]
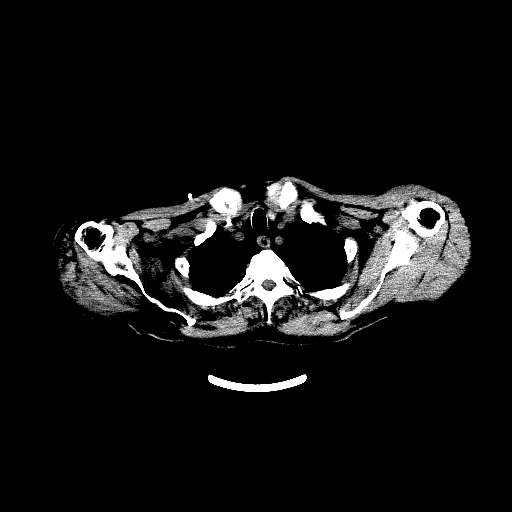
[im 231/231  brain]
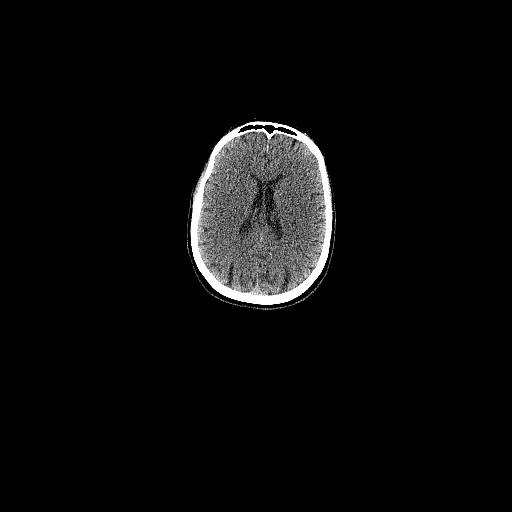

[Series 5: pet hn_sk_thigh nac · axial · 5.0mm · 4.07mm/px · z∈[-792,+128]mm · 5 of 231 slices shown]
[im 1/231]
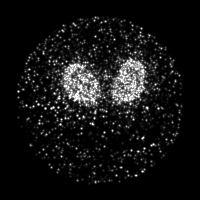
[im 58/231]
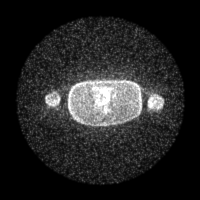
[im 116/231]
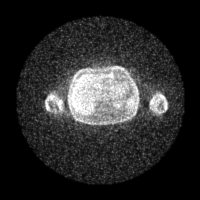
[im 173/231]
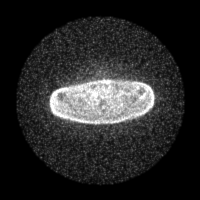
[im 231/231]
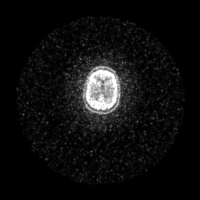

[Series 8: ct hn_sk_th 5.0 br59 lung_bone · axial · 5.0mm · 0.58mm/px · z∈[-340,-60]mm · 2 of 71 slices shown]
[im 1/71]
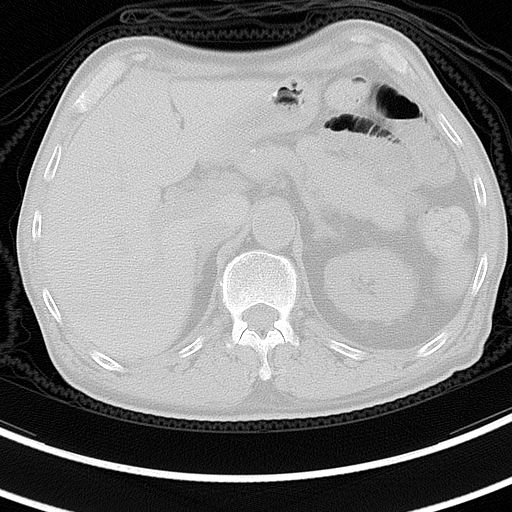
[im 71/71]
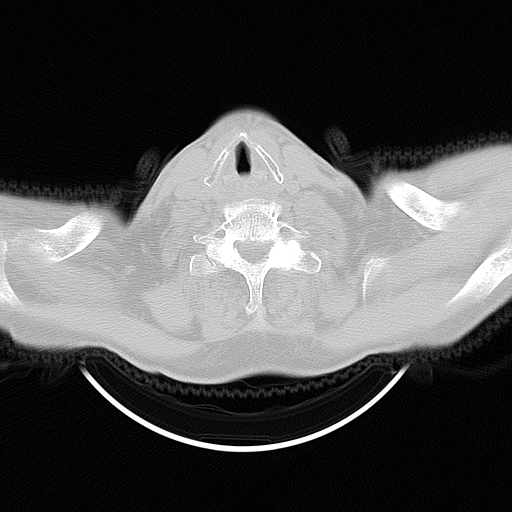

[Series 603: fused cor · 1 of 47 slices shown]
[im 1/47]
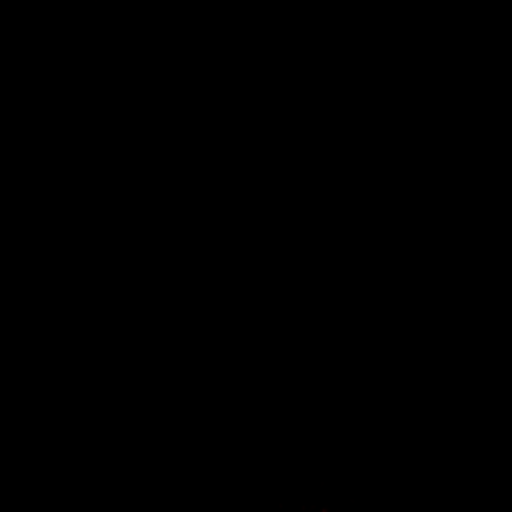

[Series 604: <mip collection> · coronal · 1.91mm/px · 1 of 32 slices shown]
[im 1/32]
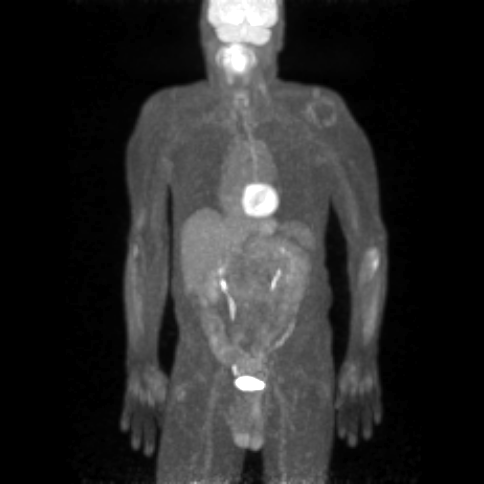

[Series 605: range-ct hn_sk_th 5.0 bf37-tra-<alpha range> · 5 of 221 slices shown]
[im 1/221]
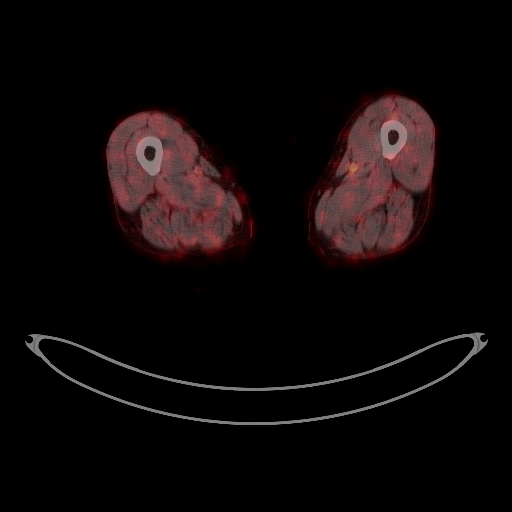
[im 56/221]
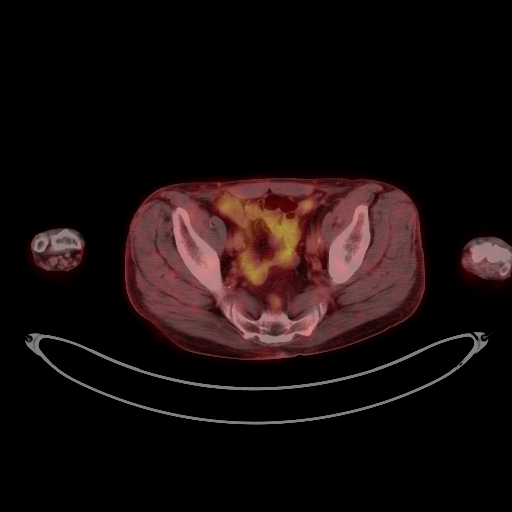
[im 111/221]
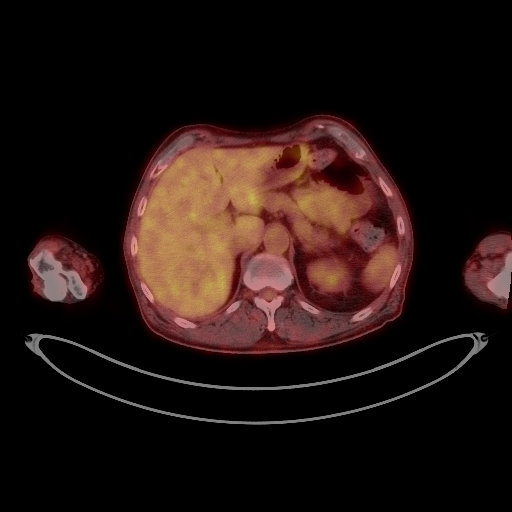
[im 166/221]
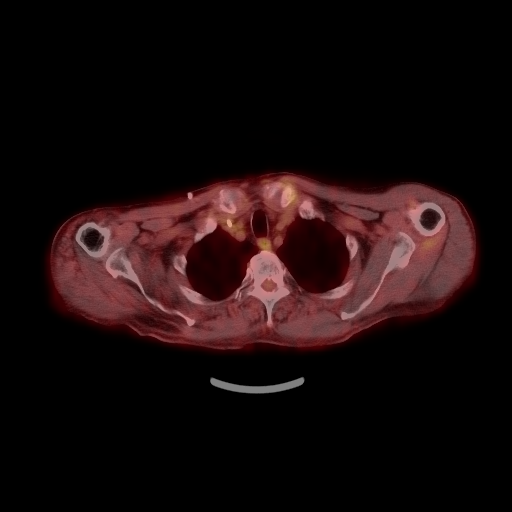
[im 221/221]
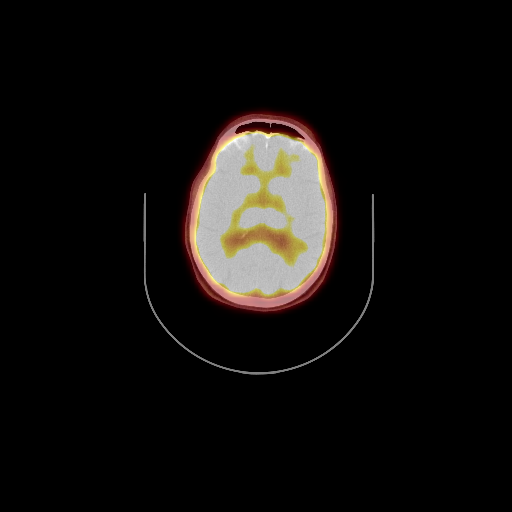

[25 of 25 positions shown; findings below may reference images not displayed]

FINDINGS: Mediastinal blood pool activity: SUV max

Liver activity: SUV max NA

NECK: Hypermetabolism involving the tongue appears more diffuse,
including posteriorly today. Example at a S.U.V. max of 15.2 versus
a S.U.V. max of 7.2 on the prior exam.

No residual cervical nodal hypermetabolism.

Incidental CT findings: Calcified right floor mild/submandibular
node measures 8 mm on 36/4 versus 1.0 cm on the prior exam (when
remeasured). No new cervical adenopathy. Bilateral carotid
atherosclerosis. Maxillary sinus mucosal thickening.

CHEST: No pulmonary parenchymal or thoracic nodal hypermetabolism.

Incidental CT findings: Right Port-A-Cath tip high right atrium.
Aortic and coronary artery atherosclerosis. Centrilobular emphysema.
Left upper lobe calcified granuloma.

ABDOMEN/PELVIS: No abdominopelvic parenchymal or nodal
hypermetabolism.

Incidental CT findings: Normal adrenal glands. Abdominal aortic
atherosclerosis. Gastrostomy tube.

SKELETON: No areas of abnormal hypermetabolism.

Incidental CT findings: No acute osseous abnormality.
IMPRESSION: 1. Increased tongue hypermetabolism, now appearing more diffuse,
including posteriorly. This suggests progressive local disease.
2. Resolution of thoracic nodal hypermetabolism.
3. No evidence of extrathoracic metastasis.
4. Aortic atherosclerosis (IICCC-YO4.4), coronary artery
atherosclerosis and emphysema (IICCC-34F.8).

## 2022-02-14 ENCOUNTER — Inpatient Hospital Stay: Payer: Medicaid Other | Admitting: Hematology and Oncology

## 2022-02-23 ENCOUNTER — Other Ambulatory Visit: Payer: Self-pay | Admitting: Hematology and Oncology

## 2022-02-23 DIAGNOSIS — C049 Malignant neoplasm of floor of mouth, unspecified: Secondary | ICD-10-CM

## 2022-02-23 NOTE — Progress Notes (Signed)
CT guided biopsy ordered, patient was agreeable to it according to discussion with nurse navigator.

## 2022-02-24 ENCOUNTER — Other Ambulatory Visit (HOSPITAL_COMMUNITY): Payer: Self-pay

## 2022-02-24 ENCOUNTER — Encounter: Payer: Self-pay | Admitting: Hematology and Oncology

## 2022-02-24 ENCOUNTER — Other Ambulatory Visit: Payer: Self-pay | Admitting: Critical Care Medicine

## 2022-02-24 ENCOUNTER — Telehealth: Payer: Self-pay | Admitting: Pulmonary Disease

## 2022-02-24 ENCOUNTER — Other Ambulatory Visit: Payer: Self-pay

## 2022-02-24 DIAGNOSIS — C7801 Secondary malignant neoplasm of right lung: Secondary | ICD-10-CM

## 2022-02-24 MED ORDER — BUPROPION HCL ER (XL) 150 MG PO TB24
150.0000 mg | ORAL_TABLET | Freq: Every day | ORAL | 1 refills | Status: DC
  Filled 2022-02-24 – 2022-02-25 (×2): qty 60, 60d supply, fill #0

## 2022-02-24 NOTE — Progress Notes (Unsigned)
Casey Edouard, MD  [name]. Referral being placed to Pulmonary.   GY

## 2022-02-24 NOTE — Telephone Encounter (Signed)
Called and left patient a voicemail for hi to call office back in regards to visit.

## 2022-02-25 ENCOUNTER — Other Ambulatory Visit (HOSPITAL_COMMUNITY): Payer: Self-pay

## 2022-02-25 ENCOUNTER — Other Ambulatory Visit: Payer: Self-pay | Admitting: Critical Care Medicine

## 2022-02-25 MED ORDER — ONDANSETRON HCL 4 MG PO TABS
4.0000 mg | ORAL_TABLET | Freq: Three times a day (TID) | ORAL | 0 refills | Status: DC | PRN
  Filled 2022-02-25: qty 40, 14d supply, fill #0

## 2022-02-25 NOTE — Telephone Encounter (Signed)
Requested medication (s) are due for refill today - yes  Requested medication (s) are on the active medication list -yes  Future visit scheduled -yes  Last refill: 02/03/22 #40  Notes to clinic: non delegated Rx  Requested Prescriptions  Pending Prescriptions Disp Refills   ondansetron (ZOFRAN) 4 MG tablet 40 tablet 0    Sig: Take 1 tablet (4 mg total) by mouth every 8 (eight) hours as needed for nausea or vomiting.     Not Delegated - Gastroenterology: Antiemetics - ondansetron Failed - 02/25/2022  9:46 AM      Failed - This refill cannot be delegated      Failed - AST in normal range and within 360 days    AST  Date Value Ref Range Status  01/31/2022 60 (H) 15 - 41 U/L Final         Passed - ALT in normal range and within 360 days    ALT  Date Value Ref Range Status  01/31/2022 40 0 - 44 U/L Final         Passed - Valid encounter within last 6 months    Recent Outpatient Visits           3 weeks ago Adverse effect of drug, initial encounter   [place] Elsie Stain, MD   5 months ago 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 [facility])   [place] Elsie Stain, MD   6 months ago 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 [facility])   [place] Elsie Stain, MD       Future Appointments             In 2 weeks Elsie Stain, MD [place]   In 3 months Elsie Stain, MD [place]               Requested Prescriptions  Pending Prescriptions Disp Refills   ondansetron (ZOFRAN) 4 MG tablet 40 tablet 0    Sig: Take 1 tablet (4 mg total) by mouth every 8 (eight) hours as needed for nausea or vomiting.     Not Delegated - Gastroenterology: Antiemetics - ondansetron Failed - 02/25/2022  9:46 AM      Failed - This refill cannot be delegated      Failed - AST in normal range and within 360 days     AST  Date Value Ref Range Status  01/31/2022 60 (H) 15 - 41 U/L Final         Passed - ALT in normal range and within 360 days    ALT  Date Value Ref Range Status  01/31/2022 40 0 - 44 U/L Final         Passed - Valid encounter within last 6 months    Recent Outpatient Visits           3 weeks ago Adverse effect of drug, initial encounter   [place] Elsie Stain, MD   5 months ago 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 [facility])   [place] Elsie Stain, MD   6 months ago 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 [facility])   [place] Elsie Stain, MD       Future Appointments  In 2 weeks Elsie Stain, MD [place]   In 3 months Joya Gaskins Burnett Harry, MD [place]

## 2022-02-26 ENCOUNTER — Other Ambulatory Visit (HOSPITAL_COMMUNITY): Payer: Self-pay

## 2022-02-26 NOTE — Telephone Encounter (Signed)
Called and spoke with patient, scheduled him to see Dr. Lamonte Sakai on 03/13/22 at 3 pm, advised to arrive by 2:45 pm for check in and to fill out new patient paperwork.  Provided him with office address and phone number.  He verbalized understanding.  Nothing further needed.

## 2022-02-27 ENCOUNTER — Other Ambulatory Visit (HOSPITAL_COMMUNITY): Payer: Self-pay

## 2022-02-28 ENCOUNTER — Other Ambulatory Visit: Payer: Self-pay | Admitting: *Deleted

## 2022-03-04 DIAGNOSIS — Z419 Encounter for procedure for purposes other than remedying health state, unspecified: Secondary | ICD-10-CM | POA: Diagnosis not present

## 2022-03-12 ENCOUNTER — Other Ambulatory Visit (HOSPITAL_COMMUNITY): Payer: Self-pay

## 2022-03-13 ENCOUNTER — Encounter: Payer: Self-pay | Admitting: Emergency Medicine

## 2022-03-13 ENCOUNTER — Ambulatory Visit (INDEPENDENT_AMBULATORY_CARE_PROVIDER_SITE_OTHER): Payer: Medicaid Other | Admitting: Emergency Medicine

## 2022-03-13 VITALS — BP 130/76 | HR 73 | Temp 98.5°F | Ht 63.0 in | Wt 117.4 lb

## 2022-03-13 DIAGNOSIS — R918 Other nonspecific abnormal finding of lung field: Secondary | ICD-10-CM

## 2022-03-13 NOTE — Addendum Note (Signed)
Addended by: Gavin Potters R on: 03/13/2022 03:42 PM   Modules accepted: Orders

## 2022-03-13 NOTE — Patient Instructions (Signed)
We reviewed your CT scan of the chest today. We will work on arranging navigational bronchoscopy to biopsy multiple pulmonary nodules.  This would be done under general anesthesia as an outpatient at [facility] endoscopy.  You will need a designated driver.  We will try to get this scheduled for 03/31/2022. We will perform a repeat CT scan of your chest to facilitate your procedure. Follow with Dr Lamonte Sakai in 1 month or next available

## 2022-03-13 NOTE — Progress Notes (Signed)
Subjective:    Patient ID: Casey [name], male    DOB: 15-Feb-1966, 56 y.o.   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  He has been followed with surveillance imaging and has had pulmonary nodules noted that have been slowly increasing in size  CT chest 10/14/2021 reviewed by me showed no mediastinal or hilar adenopathy, centrilobular emphysematous change, multiple pulmonary nodules including a cavitary 1.2 x 0.8 cm right upper lobe nodule, 9 x 7 mm left upper lobe nodule right lower lobe subpleural 11 x 7 mm nodule all of which are slightly larger than on prior imaging.  He has a groundglass nodule in the lingula that is stable in size, new 5 mm solid subpleural nodule in left lower lobe.  CT chest abdomen pelvis 02/05/2022 reviewed by me shows continued increase in size of his multiple small pulmonary nodules indicative of progressive metastatic disease, largest in the bilateral upper lobes 16 mm on the right, 16 mm on the left.  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   Social History   Socioeconomic History   Marital status: Single    Spouse name: Not on file   Number of children: Not on file   Years of education: Not on file   Highest education level: Not on file  Occupational History   Not on file  Tobacco Use   Smoking status: Every Day    Packs/day: 0.50    Types: Cigarettes   Smokeless tobacco: Never   Tobacco comments:    3 cigarettes smoked daily 03/13/22 [facility]   Vaping Use   Vaping Use: Never used  Substance and Sexual Activity   Alcohol use: Yes    Alcohol/week: 3.0 standard drinks of alcohol    Types: 3 Cans of beer per week    Comment: several beers "numbs my tongue"    Drug use: Never   Sexual activity: Not Currently  Other Topics Concern   Not on file  Social History Narrative   Not on file   Social Determinants of Health   Financial Resource Strain: Not on file  Food Insecurity: Not on file  Transportation Needs: Unmet Transportation Needs (01/29/2022)   PRAPARE - Hydrologist (Medical): Not on file    Lack of Transportation (Non-Medical): Yes  Physical Activity: Not on file  Stress: Not on file  Social Connections: Not on file  Intimate Partner Violence: Not on file     No Known Allergies   Outpatient Medications Prior to Visit  Medication Sig Dispense Refill   atorvastatin (LIPITOR) 10 MG tablet Take 1 tablet (10 mg total) by mouth daily. 90 tablet 3   buPROPion (WELLBUTRIN XL) 150 MG 24 hr tablet Take 1 tablet by mouth daily. 60 tablet 1   dexamethasone (DECADRON) 1 MG tablet Take no more than 1 tablet every other day, PRN mouth swelling. 30 tablet 1   dicyclomine (BENTYL) 10 MG capsule Take 1 capsule (10 mg total) by mouth 3 (three) times daily as needed for spasms. 30 capsule 1   levothyroxine (SYNTHROID) 25 MCG tablet Take 3 tablets (75 mcg total) by mouth daily before  breakfast. If nausea persists then reduce to 50 mcg 2 tablets daily 90 tablet 2   lidocaine (XYLOCAINE) 2 % solution MIX 1 PART 2% VISCOUS LIDOCAINE WITH 1 PART WATER. SWISH AND SPIT 10ML OF DILUTED MIXTURE TO NUMB MOUTH, UP TO 6 TIMES DAILY AS NEEDED 200 mL 4   Multiple Vitamin (MULTIVITAMIN) tablet Take 1 tablet by mouth daily.     ondansetron (ZOFRAN) 4 MG tablet Take 1 tablet (4 mg total) by mouth every 8 (eight) hours as needed for nausea or vomiting. 40 tablet 0   No facility-administered medications prior to visit.         Objective:   Physical Exam Vitals:   03/13/22 1459  BP: 130/76  Pulse: 73  Temp: 98.5 F (36.9 C)  TempSrc: Oral  SpO2: 99%  Weight: 117 lb 6.4 oz (53.3 kg)  Height: 5\' 3"  (1.6 m)   Gen: Pleasant,  well-nourished, in no distress,  normal affect  ENT: No lesions, some thickening of the tongue especially on the left, difficulty raising his tongue, no postnasal drip  Neck: No JVD, no stridor  Lungs: No use of accessory muscles, no crackles or wheezing on normal respiration, no wheeze on forced expiration  Cardiovascular: RRR, heart sounds normal, no murmur or gallops, no peripheral edema  Musculoskeletal: No deformities, no cyanosis or clubbing  Neuro: alert, awake, non focal  Skin: Warm, no lesions or rash      Assessment & Plan:  Pulmonary nodules Multiple pulmonary nodules, have been slowly enlarging on serial imaging.  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  Discussed bronchoscopy with biopsy.  Good candidate for robotic navigation.  He understands the risk, benefits, rationale and agrees to proceed.  I will try to get this set up for 03/31/2022 which is my next opening  We reviewed your CT scan of the chest today. We will work on arranging navigational bronchoscopy to biopsy multiple pulmonary nodules.  This would be done under general anesthesia as an outpatient at [facility] endoscopy.  You will need a designated driver.  We will try to get this scheduled for 03/31/2022. We will perform a repeat CT scan of your chest to facilitate your procedure. Follow with Dr Lamonte Sakai in 1 month or next available   Baltazar Apo, MD, PhD 03/13/2022, 3:35 PM [facility] [phone] or if no answer before 7:00PM call [phone] For any issues after 7:00PM please call eLink [phone]

## 2022-03-13 NOTE — H&P (View-Only) (Signed)
Subjective:    Patient ID: Casey [name], male    DOB: [birth year], 56 y.o.   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  He has been followed with surveillance imaging and has had pulmonary nodules noted that have been slowly increasing in size  CT chest 10/14/2021 reviewed by me showed no mediastinal or hilar adenopathy, centrilobular emphysematous change, multiple pulmonary nodules including a cavitary 1.2 x 0.8 cm right upper lobe nodule, 9 x 7 mm left upper lobe nodule right lower lobe subpleural 11 x 7 mm nodule all of which are slightly larger than on prior imaging.  He has a groundglass nodule in the lingula that is stable in size, new 5 mm solid subpleural nodule in left lower lobe.  CT chest abdomen pelvis 02/05/2022 reviewed by me shows continued increase in size of his multiple small pulmonary nodules indicative of progressive metastatic disease, largest in the bilateral upper lobes 16 mm on the right, 16 mm on the left.  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   Social History   Socioeconomic History   Marital status: Single    Spouse name: Not on file   Number of children: Not on file   Years of education: Not on file   Highest education level: Not on file  Occupational History   Not on file  Tobacco Use   Smoking status: Every Day    Packs/day: 0.50    Types: Cigarettes   Smokeless tobacco: Never   Tobacco comments:    3 cigarettes smoked daily 03/13/22 [facility]   Vaping Use   Vaping Use: Never used  Substance and Sexual Activity   Alcohol use: Yes    Alcohol/week: 3.0 standard drinks of alcohol    Types: 3 Cans of beer per week    Comment: several beers "numbs my tongue"    Drug use: Never   Sexual activity: Not Currently  Other Topics Concern   Not on file  Social History Narrative   Not on file   Social Determinants of Health   Financial Resource Strain: Not on file  Food Insecurity: Not on file  Transportation Needs: Unmet Transportation Needs (01/29/2022)   PRAPARE - Hydrologist (Medical): Not on file    Lack of Transportation (Non-Medical): Yes  Physical Activity: Not on file  Stress: Not on file  Social Connections: Not on file  Intimate Partner Violence: Not on file     No Known Allergies   Outpatient Medications Prior to Visit  Medication Sig Dispense Refill   atorvastatin (LIPITOR) 10 MG tablet Take 1 tablet (10 mg total) by mouth daily. 90 tablet 3   buPROPion (WELLBUTRIN XL) 150 MG 24 hr tablet Take 1 tablet by mouth daily. 60 tablet 1   dexamethasone (DECADRON) 1 MG tablet Take no more than 1 tablet every other day, PRN mouth swelling. 30 tablet 1   dicyclomine (BENTYL) 10 MG capsule Take 1 capsule (10 mg total) by mouth 3 (three) times daily as needed for spasms. 30 capsule 1   levothyroxine (SYNTHROID) 25 MCG tablet Take 3 tablets (75 mcg total) by mouth daily before  breakfast. If nausea persists then reduce to 50 mcg 2 tablets daily 90 tablet 2   lidocaine (XYLOCAINE) 2 % solution MIX 1 PART 2% VISCOUS LIDOCAINE WITH 1 PART WATER. SWISH AND SPIT 10ML OF DILUTED MIXTURE TO NUMB MOUTH, UP TO 6 TIMES DAILY AS NEEDED 200 mL 4   Multiple Vitamin (MULTIVITAMIN) tablet Take 1 tablet by mouth daily.     ondansetron (ZOFRAN) 4 MG tablet Take 1 tablet (4 mg total) by mouth every 8 (eight) hours as needed for nausea or vomiting. 40 tablet 0   No facility-administered medications prior to visit.         Objective:   Physical Exam Vitals:   03/13/22 1459  BP: 130/76  Pulse: 73  Temp: 98.5 F (36.9 C)  TempSrc: Oral  SpO2: 99%  Weight: 117 lb 6.4 oz (53.3 kg)  Height: 5\' 3"  (1.6 m)   Gen: Pleasant,  well-nourished, in no distress,  normal affect  ENT: No lesions, some thickening of the tongue especially on the left, difficulty raising his tongue, no postnasal drip  Neck: No JVD, no stridor  Lungs: No use of accessory muscles, no crackles or wheezing on normal respiration, no wheeze on forced expiration  Cardiovascular: RRR, heart sounds normal, no murmur or gallops, no peripheral edema  Musculoskeletal: No deformities, no cyanosis or clubbing  Neuro: alert, awake, non focal  Skin: Warm, no lesions or rash      Assessment & Plan:  Pulmonary nodules Multiple pulmonary nodules, have been slowly enlarging on serial imaging.  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  Discussed bronchoscopy with biopsy.  Good candidate for robotic navigation.  He understands the risk, benefits, rationale and agrees to proceed.  I will try to get this set up for 03/31/2022 which is my next opening  We reviewed your CT scan of the chest today. We will work on arranging navigational bronchoscopy to biopsy multiple pulmonary nodules.  This would be done under general anesthesia as an outpatient at [facility] endoscopy.  You will need a designated driver.  We will try to get this scheduled for 03/31/2022. We will perform a repeat CT scan of your chest to facilitate your procedure. Follow with Dr Lamonte Sakai in 1 month or next available   Baltazar Apo, MD, PhD 03/13/2022, 3:35 PM [facility] [phone] or if no answer before 7:00PM call [phone] For any issues after 7:00PM please call eLink [phone]

## 2022-03-13 NOTE — Assessment & Plan Note (Signed)
Multiple pulmonary nodules, have been slowly enlarging on serial imaging.  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  Discussed bronchoscopy with biopsy.  Good candidate for robotic navigation.  He understands the risk, benefits, rationale and agrees to proceed.  I will try to get this set up for 03/31/2022 which is my next opening  We reviewed your CT scan of the chest today. We will work on arranging navigational bronchoscopy to biopsy multiple pulmonary nodules.  This would be done under general anesthesia as an outpatient at [facility] endoscopy.  You will need a designated driver.  We will try to get this scheduled for 03/31/2022. We will perform a repeat CT scan of your chest to facilitate your procedure. Follow with Dr Lamonte Sakai in 1 month or next available

## 2022-03-16 NOTE — Progress Notes (Signed)
Established Patient Office Visit  Subjective:  Patient ID: Casey [name], male    DOB: Feb 05, 1966  Age: 56 y.o. MRN: [id number]  CC: Primary care visit to establish  HPI Casey [name] presents for primary care visit to establish face-to-face.  Note last visit was January 10 and was a video visit.   08/13/21 Casey [name] presents for primary care to establish.  This patient originally was living in [facility] but then moved to be closer to his sister in June 2020.  Prior to this in the fall 2019 in [place] he developed sores in the base of his tongue with hoarseness and swallowing difficulty.  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  He was being recommended reconstructive surgery he did not wish to have this we moved to [place] to be with his sister in June 2020 in the [place] area.  It was that time he connected with otolaryngology Dr. [name].  He also connected with medical oncology and radiation oncology at [facility] health.  He has had extensive radiotherapy and chemotherapy treatments to the base of his tongue.  Multiple notes are in our [facility] health system which I have reviewed in detail.  See the [facility] health epic system for these notes.  Currently the patient's not undergoing treatment he has had ulcerations in the mouth it is slowly healed multiple biopsies taken of these were negative for recurrent cancer.  He did have a recent PET scan which shows nodules in the lung which are new and a pleural-based lesion as well.  These are being observed for now.  Note the patient now is edentulous and is looking for Medicaid excepting dentist for dentures.  Patient is still smoking 4 to 5 cigarettes daily and does use a 7 mg nicotine patch.   Is able to eat some meals at this time.  He has no other complaints.  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  He has had extensive treatments of this documented at our last visit.  Patient seen today face-to-face.  He has developed a lesion on his lip which she is concerned about.  Is been there for several months and is not healing.  Patient also request a Medicaid dentist.  Patient also complains of occasional muscle spasticity in the stomach area.  Patient also needs colon cancer screening and is agreeable to Cologuard screening. Patient declined the flu vaccine.  He is agreeable to a pneumonia vaccination. Patient is no longer smoking cigarettes at this time and is using nicotine replacement The patient would like a dental examination to see if he is a candidate for dental implants The patient did successfully quit smoking using bupropion The patient has elevated TSH with previous radiation this needs to be further evaluated  02/03/22 The patient is experiencing increased nausea vomiting and episodes of diarrhea.  This seemed to occur when his dosage of levothyroxine was increased from 75 mcg to 100 mcg daily in May.  He still had elevated TSH levels and slightly low thyroid levels at that time.  However the patient simply not able to tolerate 100 mcg Synthroid dosing.  Symptoms worsened over the past week.  Patient was seen recently by oncology he has enlarging lung nodules they are recommending biopsy he wishes  to hold off and observe for now.  Concern is that he has metastatic cancer to the lung.  Patient is accompanied with his sister who also is advocating for the patient to hold off on biopsy of the lung nodules.  Patient's not taking any nutritional supplements is not eating well.  8/14 This patient seen in return follow-up and since the last visit he had a repeat CT scan of the chest in early July showing enlargement of both left and right upper lobe lung nodules consistent with metastatic disease.  Initially he was not  going to have biopsies performed but now has been referred to pulmonary medicine and they are going to perform on August 28 CT-guided super D imaging biopsies of the lung nodules.  Patient states he would like refills on his Zofran.  He states since reducing Synthroid to 75 mcg daily his side effects from the medication have resolved.  On arrival blood pressure initially elevated but on recheck was 123/78.  He is still smoking about 3 cigarettes daily.  There are no other complaints.  Past Medical History:  Diagnosis Date   Depression    squamous cell ca of floor of mouth 12/2018    Past Surgical History:  Procedure Laterality Date   EXCISION OF TONGUE LESION Left 09/17/2020   Procedure: Biopsy of Anterior Tongue;  Surgeon: [name], MD;  Location: [facility];  Service: ENT;  Laterality: Left;   IR GASTROSTOMY TUBE MOD SED  04/03/2020   IR GASTROSTOMY TUBE REMOVAL  10/03/2020   IR IMAGING GUIDED PORT INSERTION  04/03/2020   IR REMOVAL TUN ACCESS W/ PORT W/O FL MOD SED  10/03/2020    No family history on file.  Social History   Socioeconomic History   Marital status: Single    Spouse name: Not on file   Number of children: Not on file   Years of education: Not on file   Highest education level: Not on file  Occupational History   Not on file  Tobacco Use   Smoking status: Every Day    Packs/day: 0.50    Types: Cigarettes   Smokeless tobacco: Never   Tobacco comments:    3 cigarettes smoked daily 03/13/22 [facility]   Vaping Use   Vaping Use: Never used  Substance and Sexual Activity   Alcohol use: Yes    Alcohol/week: 3.0 standard drinks of alcohol    Types: 3 Cans of beer per week    Comment: several beers "numbs my tongue"   Drug use: Never   Sexual activity: Not Currently  Other Topics Concern   Not on file  Social History Narrative   Not on file   Social Determinants of Health   Financial Resource Strain: Not on file  Food Insecurity: Not on file   Transportation Needs: Unmet Transportation Needs (01/29/2022)   PRAPARE - Hydrologist (Medical): Not on file    Lack of Transportation (Non-Medical): Yes  Physical Activity: Not on file  Stress: Not on file  Social Connections: Not on file  Intimate Partner Violence: Not on file    Outpatient Medications Prior to Visit  Medication Sig Dispense Refill   buPROPion (WELLBUTRIN XL) 150 MG 24 hr tablet Take 1 tablet by mouth daily. 60 tablet 1   dexamethasone (DECADRON) 1 MG tablet Take no more than 1 tablet every other day, PRN mouth swelling. 30 tablet 1   dicyclomine (BENTYL) 10 MG capsule Take 1 capsule (  10 mg total) by mouth 3 (three) times daily as needed for spasms. 30 capsule 1   levothyroxine (SYNTHROID) 25 MCG tablet Take 3 tablets (75 mcg total) by mouth daily before breakfast. If nausea persists then reduce to 50 mcg 2 tablets daily 90 tablet 2   Multiple Vitamin (MULTIVITAMIN) tablet Take 1 tablet by mouth daily.     ondansetron (ZOFRAN) 4 MG tablet Take 1 tablet (4 mg total) by mouth every 8 (eight) hours as needed for nausea or vomiting. 40 tablet 0   atorvastatin (LIPITOR) 10 MG tablet Take 1 tablet (10 mg total) by mouth daily. 90 tablet 3   lidocaine (XYLOCAINE) 2 % solution MIX 1 PART 2% VISCOUS LIDOCAINE WITH 1 PART WATER. SWISH AND SPIT 10ML OF DILUTED MIXTURE TO NUMB MOUTH, UP TO 6 TIMES DAILY AS NEEDED (Patient not taking: Reported on 03/17/2022) 200 mL 4   No facility-administered medications prior to visit.    No Known Allergies  ROS Review of Systems  Constitutional: Negative.  Negative for fever.  HENT:  Positive for trouble swallowing. Negative for ear pain, postnasal drip, rhinorrhea, sinus pressure, sore throat and voice change.   Eyes: Negative.   Respiratory: Negative.  Negative for apnea, cough, choking, chest tightness, shortness of breath, wheezing and stridor.   Cardiovascular: Negative.  Negative for chest pain, palpitations  and leg swelling.  Gastrointestinal:  Negative for abdominal distention, abdominal pain, diarrhea, nausea and vomiting.  Genitourinary: Negative.   Musculoskeletal: Negative.  Negative for arthralgias and myalgias.  Skin:  Negative for rash and wound.  Allergic/Immunologic: Negative.  Negative for environmental allergies and food allergies.  Neurological: Negative.  Negative for dizziness, syncope, weakness and headaches.  Hematological: Negative.  Negative for adenopathy. Does not bruise/bleed easily.  Psychiatric/Behavioral: Negative.  Negative for agitation, dysphoric mood, sleep disturbance and suicidal ideas. The patient is not nervous/anxious.       Objective:    Physical Exam Vitals reviewed.  Constitutional:      Appearance: He is well-developed. He is not diaphoretic.     Comments: Weight has stabilized now up to 118 pounds  HENT:     Head: Normocephalic and atraumatic.     Nose: Nose normal. No nasal deformity, septal deviation, mucosal edema or rhinorrhea.     Right Sinus: No maxillary sinus tenderness or frontal sinus tenderness.     Left Sinus: No maxillary sinus tenderness or frontal sinus tenderness.     Mouth/Throat:     Pharynx: No oropharyngeal exudate.  Eyes:     General: No scleral icterus.    Conjunctiva/sclera: Conjunctivae normal.     Pupils: Pupils are equal, round, and reactive to light.  Neck:     Thyroid: No thyromegaly.     Vascular: No carotid bruit or JVD.     Trachea: Trachea normal. No tracheal tenderness or tracheal deviation.     Comments: The tissue at the top of the neck under the jaw is firm as evidence from previous radiotherapy Cardiovascular:     Rate and Rhythm: Normal rate and regular rhythm.     Chest Wall: PMI is not displaced.     Pulses: Normal pulses. No decreased pulses.     Heart sounds: Normal heart sounds, S1 normal and S2 normal. Heart sounds not distant. No murmur heard.    No systolic murmur is present.     No diastolic  murmur is present.     No friction rub. No gallop. No S3 or S4 sounds.  Pulmonary:     Effort: No tachypnea, accessory muscle usage or respiratory distress.     Breath sounds: No stridor. No decreased breath sounds, wheezing, rhonchi or rales.  Chest:     Chest wall: No tenderness.  Abdominal:     General: Bowel sounds are normal. There is no distension.     Palpations: Abdomen is soft. Abdomen is not rigid.     Tenderness: There is no abdominal tenderness. There is no guarding or rebound.  Musculoskeletal:        General: Normal range of motion.     Cervical back: Normal range of motion. No edema, erythema or rigidity. No muscular tenderness. Normal range of motion.  Lymphadenopathy:     Head:     Right side of head: No submental or submandibular adenopathy.     Left side of head: No submental or submandibular adenopathy.     Cervical: No cervical adenopathy.  Skin:    General: Skin is warm and dry.     Coloration: Skin is not pale.     Findings: No rash.     Nails: There is no clubbing.  Neurological:     Mental Status: He is alert and oriented to person, place, and time.     Sensory: No sensory deficit.  Psychiatric:        Speech: Speech normal.        Behavior: Behavior normal.     BP 123/78   Pulse 83   Ht _0  (1.6 m)   Wt 118 lb 12.8 oz (53.9 kg)   SpO2 100%   BMI 21.04 kg/m  Wt Readings from Last 3 Encounters:  03/17/22 118 lb 12.8 oz (53.9 kg)  03/13/22 117 lb 6.4 oz (53.3 kg)  02/03/22 116 lb (52.6 kg)     Health Maintenance Due  Topic Date Due   Zoster Vaccines- Shingrix (1 of 2) Never done   COVID-19 Vaccine (4 - Pfizer risk series) 10/23/2020   INFLUENZA VACCINE  03/04/2022    There are no preventive care reminders to display for this patient.  Lab Results  Component Value Date   TSH 12.400 (H) 12/10/2021   Lab Results  Component Value Date   WBC 5.8 01/31/2022   HGB 13.9 01/31/2022   HCT 39.8 01/31/2022   MCV 95.9 01/31/2022   PLT 193  01/31/2022   Lab Results  Component Value Date   NA 135 01/31/2022   K 4.2 01/31/2022   CO2 28 01/31/2022   GLUCOSE 103 (H) 01/31/2022   BUN 8 01/31/2022   CREATININE 0.91 01/31/2022   BILITOT 0.8 01/31/2022   ALKPHOS 70 01/31/2022   AST 60 (H) 01/31/2022   ALT 40 01/31/2022   PROT 7.5 01/31/2022   ALBUMIN 4.4 01/31/2022   CALCIUM 9.7 01/31/2022   ANIONGAP 7 01/31/2022   EGFR 104 09/17/2021   Lab Results  Component Value Date   CHOL 255 (H) 09/17/2021   Lab Results  Component Value Date   HDL 79 09/17/2021   Lab Results  Component Value Date   LDLCALC 110 (H) 09/17/2021   Lab Results  Component Value Date   TRIG 388 (H) 09/17/2021   Lab Results  Component Value Date   CHOLHDL 3.2 09/17/2021   No results found for: "HGBA1C"    Assessment & Plan:   Problem List Items Addressed This Visit       Respiratory   Malignant neoplasm metastatic to lung [facility])    Pulmonary nodules  most likely metastatic disease increasing in size on recent CT of the chest  Plan is to perform biopsy bronchoscopic with super D imaging navigation per pulmonary medicine with Dr. Lamonte Sakai  Further follow-up pending results of biopsy per oncology        Endocrine   Hypothyroidism (acquired)s/p XRT to mouth/neck    Current dose of Synthroid 75 mcg is adequate        Other   Hyperlipidemia    Due to side effects patient's not on atorvastatin      RESOLVED: Elevated TSH    Thyroid function improved current dose of Synthroid adequate     No orders of the defined types were placed in this encounter.  Follow-up: Return in about 3 months (around 06/11/2022) for chronic conditions.    Asencion Noble, MD

## 2022-03-17 ENCOUNTER — Encounter: Payer: Self-pay | Admitting: Critical Care Medicine

## 2022-03-17 ENCOUNTER — Ambulatory Visit: Payer: Medicaid Other | Attending: Critical Care Medicine | Admitting: Critical Care Medicine

## 2022-03-17 VITALS — BP 123/78 | HR 83 | Ht 63.0 in | Wt 118.8 lb

## 2022-03-17 DIAGNOSIS — R7989 Other specified abnormal findings of blood chemistry: Secondary | ICD-10-CM | POA: Diagnosis not present

## 2022-03-17 DIAGNOSIS — E782 Mixed hyperlipidemia: Secondary | ICD-10-CM | POA: Diagnosis not present

## 2022-03-17 DIAGNOSIS — C7802 Secondary malignant neoplasm of left lung: Secondary | ICD-10-CM | POA: Diagnosis not present

## 2022-03-17 DIAGNOSIS — C7801 Secondary malignant neoplasm of right lung: Secondary | ICD-10-CM | POA: Diagnosis not present

## 2022-03-17 DIAGNOSIS — C049 Malignant neoplasm of floor of mouth, unspecified: Secondary | ICD-10-CM

## 2022-03-17 DIAGNOSIS — E039 Hypothyroidism, unspecified: Secondary | ICD-10-CM | POA: Diagnosis not present

## 2022-03-17 NOTE — Assessment & Plan Note (Signed)
Pulmonary nodules most likely metastatic disease increasing in size on recent CT of the chest  Plan is to perform biopsy bronchoscopic with super D imaging navigation per pulmonary medicine with Dr. Lamonte Sakai  Further follow-up pending results of biopsy per oncology

## 2022-03-17 NOTE — Assessment & Plan Note (Signed)
Current dose of Synthroid 75 mcg is adequate

## 2022-03-17 NOTE — Assessment & Plan Note (Signed)
Thyroid function improved current dose of Synthroid adequate

## 2022-03-17 NOTE — Patient Instructions (Signed)
Future refills on all medications will be available at [facility] your appointment with my physician assistant Levada Dy November 8 through primary care follow-up  Keep your follow-up appointments with Dr. Lamonte Sakai with regards to your lung biopsy

## 2022-03-17 NOTE — Assessment & Plan Note (Signed)
Due to side effects patient's not on atorvastatin

## 2022-03-28 ENCOUNTER — Encounter (HOSPITAL_COMMUNITY): Payer: Self-pay | Admitting: Emergency Medicine

## 2022-03-28 ENCOUNTER — Ambulatory Visit (HOSPITAL_COMMUNITY)
Admission: RE | Admit: 2022-03-28 | Discharge: 2022-03-28 | Disposition: A | Discharge status: Home / Self Care | Payer: Medicaid Other | Source: Ambulatory Visit | Attending: Emergency Medicine | Admitting: Emergency Medicine

## 2022-03-28 ENCOUNTER — Other Ambulatory Visit: Payer: Self-pay

## 2022-03-28 ENCOUNTER — Other Ambulatory Visit: Payer: Self-pay | Admitting: Emergency Medicine

## 2022-03-28 DIAGNOSIS — R918 Other nonspecific abnormal finding of lung field: Secondary | ICD-10-CM | POA: Diagnosis not present

## 2022-03-28 DIAGNOSIS — I700 Atherosclerosis of aorta: Secondary | ICD-10-CM | POA: Diagnosis not present

## 2022-03-28 DIAGNOSIS — C760 Malignant neoplasm of head, face and neck: Secondary | ICD-10-CM | POA: Diagnosis not present

## 2022-03-28 DIAGNOSIS — J439 Emphysema, unspecified: Secondary | ICD-10-CM | POA: Diagnosis not present

## 2022-03-28 LAB — SARS CORONAVIRUS 2 (TAT 6-24 HRS): SARS Coronavirus 2: NEGATIVE

## 2022-03-28 NOTE — Progress Notes (Signed)
Spoke with pt for pre-op call. Pt denies cardiac history, HTN or Diabetes. Pt has been treated for cancer of the tongue/mouth in 2020.  Covid test done today and it's negative.   Shower instructions given to pt and he voiced understanding.

## 2022-03-31 ENCOUNTER — Encounter (HOSPITAL_COMMUNITY)
Admission: RE | Disposition: A | Discharge status: Home / Self Care | Payer: Self-pay | Source: Ambulatory Visit | Attending: Emergency Medicine

## 2022-03-31 ENCOUNTER — Ambulatory Visit (HOSPITAL_COMMUNITY): Payer: Medicaid Other

## 2022-03-31 ENCOUNTER — Other Ambulatory Visit: Payer: Self-pay

## 2022-03-31 ENCOUNTER — Encounter (HOSPITAL_COMMUNITY): Payer: Self-pay | Admitting: Emergency Medicine

## 2022-03-31 ENCOUNTER — Ambulatory Visit (HOSPITAL_COMMUNITY)
Admission: RE | Admit: 2022-03-31 | Discharge: 2022-03-31 | Disposition: A | Discharge status: Home / Self Care | Payer: Medicaid Other | Source: Ambulatory Visit | Attending: Emergency Medicine | Admitting: Emergency Medicine

## 2022-03-31 ENCOUNTER — Ambulatory Visit (HOSPITAL_BASED_OUTPATIENT_CLINIC_OR_DEPARTMENT_OTHER): Payer: Medicaid Other | Admitting: Anesthesiology

## 2022-03-31 ENCOUNTER — Ambulatory Visit (HOSPITAL_COMMUNITY): Payer: Medicaid Other | Admitting: Anesthesiology

## 2022-03-31 DIAGNOSIS — Z85810 Personal history of malignant neoplasm of tongue: Secondary | ICD-10-CM | POA: Insufficient documentation

## 2022-03-31 DIAGNOSIS — F17210 Nicotine dependence, cigarettes, uncomplicated: Secondary | ICD-10-CM | POA: Insufficient documentation

## 2022-03-31 DIAGNOSIS — Z923 Personal history of irradiation: Secondary | ICD-10-CM | POA: Insufficient documentation

## 2022-03-31 DIAGNOSIS — E039 Hypothyroidism, unspecified: Secondary | ICD-10-CM | POA: Insufficient documentation

## 2022-03-31 DIAGNOSIS — F17200 Nicotine dependence, unspecified, uncomplicated: Secondary | ICD-10-CM

## 2022-03-31 DIAGNOSIS — M1990 Unspecified osteoarthritis, unspecified site: Secondary | ICD-10-CM | POA: Diagnosis not present

## 2022-03-31 DIAGNOSIS — C3432 Malignant neoplasm of lower lobe, left bronchus or lung: Secondary | ICD-10-CM | POA: Diagnosis not present

## 2022-03-31 DIAGNOSIS — R918 Other nonspecific abnormal finding of lung field: Secondary | ICD-10-CM

## 2022-03-31 DIAGNOSIS — F32A Depression, unspecified: Secondary | ICD-10-CM | POA: Insufficient documentation

## 2022-03-31 HISTORY — PX: BRONCHIAL BIOPSY: SHX5109

## 2022-03-31 HISTORY — DX: Hypothyroidism, unspecified: E03.9

## 2022-03-31 HISTORY — PX: VIDEO BRONCHOSCOPY WITH RADIAL ENDOBRONCHIAL ULTRASOUND: SHX6849

## 2022-03-31 HISTORY — PX: BRONCHIAL NEEDLE ASPIRATION BIOPSY: SHX5106

## 2022-03-31 HISTORY — PX: BRONCHIAL BRUSHINGS: SHX5108

## 2022-03-31 HISTORY — DX: Unspecified osteoarthritis, unspecified site: M19.90

## 2022-03-31 LAB — CBC
HCT: 39 % (ref 39.0–52.0)
Hemoglobin: 13.8 g/dL (ref 13.0–17.0)
MCH: 34.5 pg — ABNORMAL HIGH (ref 26.0–34.0)
MCHC: 35.4 g/dL (ref 30.0–36.0)
MCV: 97.5 fL (ref 80.0–100.0)
Platelets: 215 10*3/uL (ref 150–400)
RBC: 4 MIL/uL — ABNORMAL LOW (ref 4.22–5.81)
RDW: 15.5 % (ref 11.5–15.5)
WBC: 5 10*3/uL (ref 4.0–10.5)
nRBC: 0 % (ref 0.0–0.2)

## 2022-03-31 SURGERY — BRONCHOSCOPY, WITH BIOPSY USING ELECTROMAGNETIC NAVIGATION
Anesthesia: General | Laterality: Bilateral

## 2022-03-31 MED ORDER — ONDANSETRON HCL 4 MG/2ML IJ SOLN
INTRAMUSCULAR | Status: DC | PRN
  Administered 2022-03-31: 4 mg via INTRAVENOUS

## 2022-03-31 MED ORDER — ROCURONIUM BROMIDE 10 MG/ML (PF) SYRINGE: PREFILLED_SYRINGE | INTRAVENOUS | Status: DC | PRN

## 2022-03-31 MED ORDER — OXYCODONE HCL 5 MG PO TABS: 5.0000 mg | ORAL_TABLET | Freq: Once | ORAL | Status: DC | PRN

## 2022-03-31 MED ORDER — SUGAMMADEX SODIUM 200 MG/2ML IV SOLN
INTRAVENOUS | Status: DC | PRN
  Administered 2022-03-31: 200 mg via INTRAVENOUS

## 2022-03-31 MED ORDER — LIDOCAINE 2% (20 MG/ML) 5 ML SYRINGE
INTRAMUSCULAR | Status: DC | PRN
  Administered 2022-03-31: 20 mg via INTRAVENOUS
  Administered 2022-03-31: 60 mg via INTRAVENOUS

## 2022-03-31 MED ORDER — FENTANYL CITRATE (PF) 100 MCG/2ML IJ SOLN: 25.0000 ug | INTRAMUSCULAR | Status: DC | PRN

## 2022-03-31 MED ORDER — ESMOLOL HCL 100 MG/10ML IV SOLN
INTRAVENOUS | Status: DC | PRN
  Administered 2022-03-31 (×2): 30 mg via INTRAVENOUS

## 2022-03-31 MED ORDER — PROPOFOL 10 MG/ML IV BOLUS
INTRAVENOUS | Status: DC | PRN
  Administered 2022-03-31: 140 mg via INTRAVENOUS
  Administered 2022-03-31: 20 mg via INTRAVENOUS

## 2022-03-31 MED ORDER — PHENYLEPHRINE 80 MCG/ML (10ML) SYRINGE FOR IV PUSH (FOR BLOOD PRESSURE SUPPORT)
PREFILLED_SYRINGE | INTRAVENOUS | Status: DC | PRN
  Administered 2022-03-31: 160 ug via INTRAVENOUS
  Administered 2022-03-31 (×4): 80 ug via INTRAVENOUS

## 2022-03-31 MED ORDER — ROCURONIUM BROMIDE 10 MG/ML (PF) SYRINGE
PREFILLED_SYRINGE | INTRAVENOUS | Status: DC | PRN
  Administered 2022-03-31: 50 mg via INTRAVENOUS
  Administered 2022-03-31: 10 mg via INTRAVENOUS

## 2022-03-31 MED ORDER — CHLORHEXIDINE GLUCONATE 0.12 % MT SOLN
OROMUCOSAL | Status: AC
  Administered 2022-03-31: 15 mL via OROMUCOSAL
  Filled 2022-03-31: qty 15

## 2022-03-31 MED ORDER — EPHEDRINE SULFATE-NACL 50-0.9 MG/10ML-% IV SOSY
PREFILLED_SYRINGE | INTRAVENOUS | Status: DC | PRN
  Administered 2022-03-31: 5 mg via INTRAVENOUS

## 2022-03-31 MED ORDER — OXYCODONE HCL 5 MG/5ML PO SOLN: 5.0000 mg | Freq: Once | ORAL | Status: DC | PRN

## 2022-03-31 MED ORDER — DEXAMETHASONE SODIUM PHOSPHATE 10 MG/ML IJ SOLN
INTRAMUSCULAR | Status: DC | PRN
  Administered 2022-03-31: 10 mg via INTRAVENOUS

## 2022-03-31 MED ORDER — ONDANSETRON HCL 4 MG/2ML IJ SOLN: 4.0000 mg | Freq: Four times a day (QID) | INTRAMUSCULAR | Status: DC | PRN

## 2022-03-31 MED ORDER — CHLORHEXIDINE GLUCONATE 0.12 % MT SOLN
15.0000 mL | Freq: Once | OROMUCOSAL | Status: AC
  Filled 2022-03-31: qty 15

## 2022-03-31 MED ORDER — LACTATED RINGERS IV SOLN: INTRAVENOUS | Status: DC

## 2022-03-31 NOTE — Anesthesia Postprocedure Evaluation (Signed)
Anesthesia Post Note  Patient: Casey [name]  Procedure(s) Performed: ROBOTIC ASSISTED NAVIGATIONAL BRONCHOSCOPY (Bilateral) VIDEO BRONCHOSCOPY WITH RADIAL ENDOBRONCHIAL ULTRASOUND BRONCHIAL BIOPSIES BRONCHIAL NEEDLE ASPIRATION BIOPSIES BRONCHIAL BRUSHINGS     Patient location during evaluation: PACU Anesthesia Type: General Level of consciousness: awake and alert Pain management: pain level controlled Vital Signs Assessment: post-procedure vital signs reviewed and stable Respiratory status: spontaneous breathing, nonlabored ventilation, respiratory function stable and patient connected to nasal cannula oxygen Cardiovascular status: blood pressure returned to baseline and stable Postop Assessment: no apparent nausea or vomiting Anesthetic complications: no   No notable events documented.  Last Vitals:  Vitals:   03/31/22 0905 03/31/22 0920  BP: (!) 148/88 120/88  Pulse: 69 69  Resp: 13 16  Temp:  37 C  SpO2: 95% 92%    Last Pain:  Vitals:   03/31/22 0920  TempSrc:   PainSc: 0-No pain                 Acel Natzke S

## 2022-03-31 NOTE — Interval H&P Note (Signed)
History and Physical Interval Note:  03/31/2022 7:21 AM  Casey [name]  has presented today for surgery, with the diagnosis of BILATERAL PULMONARY NODULES.  The various methods of treatment have been discussed with the patient and family. After consideration of risks, benefits and other options for treatment, the patient has consented to  Procedure(s): ROBOTIC ASSISTED NAVIGATIONAL BRONCHOSCOPY (Bilateral) as a surgical intervention.  The patient's history has been reviewed, patient examined, no change in status, stable for surgery.  I have reviewed the patient's chart and labs.  Questions were answered to the patient's satisfaction.     Collene Gobble

## 2022-03-31 NOTE — Op Note (Signed)
Video Bronchoscopy with Robotic Assisted Bronchoscopic Navigation   Date of Operation: 03/31/2022   Pre-op Diagnosis: Bilateral pulmonary nodules  Post-op Diagnosis: Same  Surgeon: Baltazar Apo  Assistants: None  Anesthesia: General endotracheal anesthesia  Operation: Flexible video fiberoptic bronchoscopy with robotic assistance and biopsies.  Estimated Blood Loss: Minimal  Complications: None  Indications and History: Casey [name] is a 56 y.o. male with history of squamous cell cancer of the tongue treated with chemoradiation.  He has bilateral enlarging pulmonary nodules on surveillance chest imaging.  Recommendation made to achieve a tissue diagnosis via robotic assisted navigational bronchoscopy. The risks, benefits, complications, treatment options and expected outcomes were discussed with the patient.  The possibilities of pneumothorax, pneumonia, reaction to medication, pulmonary aspiration, perforation of a viscus, bleeding, failure to diagnose a condition and creating a complication requiring transfusion or operation were discussed with the patient who freely signed the consent.    Description of Procedure: The patient was seen in the Preoperative Area, was examined and was deemed appropriate to proceed.  The patient was taken to [facility] endoscopy room 3, identified as Lianne Bushy Ellen and the procedure verified as Flexible Video Fiberoptic Bronchoscopy.  A Time Out was held and the above information confirmed.   Prior to the date of the procedure a high-resolution CT scan of the chest was performed. Utilizing ION software program a virtual tracheobronchial tree was generated to allow the creation of distinct navigation pathways to the patient's parenchymal abnormalities. After being taken to the operating room general anesthesia was initiated and the patient  was orally intubated. The video fiberoptic bronchoscope was introduced via the endotracheal tube and a general  inspection was performed which showed normal right and left lung anatomy, aspiration of the bilateral mainstems was completed to remove any remaining secretions. Robotic catheter inserted into patient's endotracheal tube.   Target #1 right lower lobe pulmonary nodule: The distinct navigation pathways prepared prior to this procedure were then utilized to navigate to patient's lesion identified on CT scan. The robotic catheter was secured into place and the vision probe was withdrawn.  Lesion location was approximated using fluoroscopy and radial endobronchial ultrasound for peripheral targeting. Under fluoroscopic guidance transbronchial needle brushings, transbronchial needle biopsies, and transbronchial forceps biopsies were performed to be sent for cytology and pathology.   Target #2 left upper lobe pulmonary nodule: The distinct navigation pathways prepared prior to this procedure were then utilized to navigate to patient's lesion identified on CT scan. The robotic catheter was secured into place and the vision probe was withdrawn.  Lesion location was approximated using fluoroscopy and radial endobronchial ultrasound for peripheral targeting. Under fluoroscopic guidance transbronchial needle brushings, transbronchial needle biopsies, and transbronchial forceps biopsies were performed to be sent for cytology and pathology.  At the end of the procedure a general airway inspection was performed and there was no evidence of active bleeding. The bronchoscope was removed.  The patient tolerated the procedure well. There was no significant blood loss and there were no obvious complications. A post-procedural chest x-ray is pending.  Samples Target #1: 1. Transbronchial needle brushings from right lower lobe pulmonary nodule 2. Transbronchial Wang needle biopsies from right lower lobe pulmonary nodule 3. Transbronchial forceps biopsies from right lower lobe pulmonary nodule   Samples Target #2: 1.  Transbronchial needle brushings from left upper lobe pulmonary nodule 2. Transbronchial Wang needle biopsies from left upper lobe pulmonary nodule 3. Transbronchial forceps biopsies from left upper lobe pulmonary nodule   Plans:  The patient will be discharged from the PACU to home when recovered from anesthesia and after chest x-ray is reviewed. We will review the cytology, pathology and microbiology results with the patient when they become available. Outpatient followup will be with Dr. Lamonte Sakai.    Baltazar Apo, MD, PhD 03/31/2022, 8:46 AM [facility] [phone] or if no answer before 7:00PM call [phone] For any issues after 7:00PM please call eLink [phone]

## 2022-03-31 NOTE — Transfer of Care (Signed)
Immediate Anesthesia Transfer of Care Note  Patient: Casey [name]  Procedure(s) Performed: ROBOTIC ASSISTED NAVIGATIONAL BRONCHOSCOPY (Bilateral) VIDEO BRONCHOSCOPY WITH RADIAL ENDOBRONCHIAL ULTRASOUND BRONCHIAL BIOPSIES BRONCHIAL NEEDLE ASPIRATION BIOPSIES BRONCHIAL BRUSHINGS  Patient Location: PACU  Anesthesia Type:General  Level of Consciousness: awake, alert  and oriented  Airway & Oxygen Therapy: Patient Spontanous Breathing  Post-op Assessment: Report given to RN, Post -op Vital signs reviewed and stable and Patient moving all extremities X 4  Post vital signs: Reviewed and stable  Last Vitals:  Vitals Value Taken Time  BP    Temp    Pulse 68 03/31/22 0854  Resp 14 03/31/22 0854  SpO2 100 % 03/31/22 0854  Vitals shown include unvalidated device data.  Last Pain:  Vitals:   03/31/22 0608  TempSrc:   PainSc: 0-No pain         Complications: No notable events documented.

## 2022-03-31 NOTE — Discharge Instructions (Signed)
Flexible Bronchoscopy, Care After This sheet gives you information about how to care for yourself after your test. Your doctor may also give you more specific instructions. If you have problems or questions, contact your doctor. Follow these instructions at home: Eating and drinking When your numbness is gone and your cough and gag reflexes have come back, you may: Eat only soft foods. Slowly drink liquids. The day after the test, go back to your normal diet. Driving Do not drive for 24 hours if you were given a medicine to help you relax (sedative). Do not drive or use heavy machinery while taking prescription pain medicine. General instructions  Take over-the-counter and prescription medicines only as told by your doctor. Return to your normal activities as told. Ask what activities are safe for you. Do not use any products that have nicotine or tobacco in them. This includes cigarettes and e-cigarettes. If you need help quitting, ask your doctor. Keep all follow-up visits as told by your doctor. This is important. It is very important if you had a tissue sample (biopsy) taken. Get help right away if: You have shortness of breath that gets worse. You get light-headed. You feel like you are going to pass out (faint). You have chest pain. You cough up: More than a little blood. More blood than before. Summary Do not eat or drink anything (not even water) for 2 hours after your test, or until your numbing medicine wears off. Do not use cigarettes. Do not use e-cigarettes. Get help right away if you have chest pain.  Please call our office for any questions or concerns.  [phone].  This information is not intended to replace advice given to you by your health care provider. Make sure you discuss any questions you have with your health care provider. Document Released: 05/18/2009 Document Revised: 07/03/2017 Document Reviewed: 08/08/2016 Elsevier Patient Education  2020 [employer]  

## 2022-03-31 NOTE — Anesthesia Procedure Notes (Signed)
Procedure Name: Intubation Date/Time: 03/31/2022 7:36 AM  Performed by: Reeves Dam, CRNAPre-anesthesia Checklist: Patient identified, Patient being monitored, Timeout performed, Emergency Drugs available and Suction available Patient Re-evaluated:Patient Re-evaluated prior to induction Oxygen Delivery Method: Circle system utilized Preoxygenation: Pre-oxygenation with 100% oxygen Induction Type: IV induction Ventilation: Mask ventilation with difficulty Laryngoscope Size: Glidescope and 4 (GlideGo) Grade View: Grade I Tube type: Oral Tube size: 8.5 mm Number of attempts: 1 Airway Equipment and Method: Stylet Placement Confirmation: ETT inserted through vocal cords under direct vision, positive ETCO2 and breath sounds checked- equal and bilateral Secured at: 23 cm Tube secured with: Tape Dental Injury: Teeth and Oropharynx as per pre-operative assessment  Comments: Grade 1 view with glideGO 4

## 2022-03-31 NOTE — Anesthesia Preprocedure Evaluation (Signed)
Anesthesia Evaluation  Patient identified by MRN, date of birth, ID band Patient awake    Reviewed: Allergy & Precautions, H&P , NPO status , Patient's Chart, lab work & pertinent test results  Airway Mallampati: II   Neck ROM: full    Dental   Pulmonary Current Smoker and Patient abstained from smoking.,    breath sounds clear to auscultation       Cardiovascular negative cardio ROS   Rhythm:regular Rate:Normal     Neuro/Psych PSYCHIATRIC DISORDERS Depression    GI/Hepatic   Endo/Other  Hypothyroidism   Renal/GU      Musculoskeletal  (+) Arthritis ,   Abdominal   Peds  Hematology   Anesthesia Other Findings   Reproductive/Obstetrics                             Anesthesia Physical Anesthesia Plan  ASA: 3  Anesthesia Plan: General   Post-op Pain Management:    Induction: Intravenous  PONV Risk Score and Plan: 1 and Ondansetron, Dexamethasone, Midazolam and Treatment may vary due to age or medical condition  Airway Management Planned: Video Laryngoscope Planned  Additional Equipment:   Intra-op Plan:   Post-operative Plan: Extubation in OR  Informed Consent: I have reviewed the patients History and Physical, chart, labs and discussed the procedure including the risks, benefits and alternatives for the proposed anesthesia with the patient or authorized representative who has indicated his/her understanding and acceptance.     Dental advisory given  Plan Discussed with: CRNA, Anesthesiologist and Surgeon  Anesthesia Plan Comments:         Anesthesia Quick Evaluation

## 2022-04-01 ENCOUNTER — Telehealth: Payer: Self-pay | Admitting: Emergency Medicine

## 2022-04-01 DIAGNOSIS — C049 Malignant neoplasm of floor of mouth, unspecified: Secondary | ICD-10-CM

## 2022-04-02 ENCOUNTER — Encounter (HOSPITAL_COMMUNITY): Payer: Self-pay | Admitting: Emergency Medicine

## 2022-04-02 LAB — CYTOLOGY - NON PAP

## 2022-04-03 ENCOUNTER — Telehealth: Payer: Self-pay | Admitting: Hematology and Oncology

## 2022-04-03 ENCOUNTER — Telehealth: Payer: Self-pay | Admitting: Emergency Medicine

## 2022-04-03 NOTE — Telephone Encounter (Signed)
Patient has OV scheduled with Dr. Chryl Heck 04/17/22 at 1045.

## 2022-04-03 NOTE — Telephone Encounter (Signed)
The patient is calling because his pharmacy told him he can't get the mouth wash ( Chlorhexidene) without a RX froma dr. The patient wants to know if Dr. Lamonte Sakai would write him a prescription for it. He stated they have talked about it and Dr. Lamonte Sakai told him he would if he needed one.   Pharmacy: [facility]

## 2022-04-03 NOTE — Telephone Encounter (Signed)
Reviewed biopsy results with patient.  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  He needs to follow with oncology, has seen Dr. Chryl Heck most recently.  We will make him an appointment with her.

## 2022-04-03 NOTE — Telephone Encounter (Signed)
Scheduled appt per 8/31 referral. Called pt, no answer. Left msg with appt date/time. Requested for pt to call back to confirm appt.

## 2022-04-04 ENCOUNTER — Other Ambulatory Visit (HOSPITAL_COMMUNITY): Payer: Self-pay

## 2022-04-04 DIAGNOSIS — Z419 Encounter for procedure for purposes other than remedying health state, unspecified: Secondary | ICD-10-CM | POA: Diagnosis not present

## 2022-04-04 NOTE — Telephone Encounter (Signed)
Called and spoke with pt who was requesting Rx fof chlorhexidine mouth wash to be called into the pharmacy. Pt stated that he thought at [facility] with [facility] 8/31 that this had been called in but when checked with pharmacy he was told that there was no Rx on file.  Dr. Lamonte Sakai, please advise on this for pt.

## 2022-04-08 NOTE — Telephone Encounter (Signed)
Ok to call in script to use 20cc 1-2x a day. Rinse and spit

## 2022-04-09 ENCOUNTER — Other Ambulatory Visit (HOSPITAL_COMMUNITY): Payer: Self-pay

## 2022-04-09 MED ORDER — CHLORHEXIDINE GLUCONATE 0.12 % MT SOLN
OROMUCOSAL | 1 refills | Status: DC
  Filled 2022-04-09: qty 473, 30d supply, fill #0
  Filled 2022-05-15: qty 473, 30d supply, fill #1

## 2022-04-09 NOTE — Telephone Encounter (Signed)
Called and spoke with pt letting him know that RB was fine with [place] sending Rx to pharmacy for him and he verbalized understanding. Verified preferred pharmacy and sent Rx in for pt. Nothing further needed.

## 2022-04-15 ENCOUNTER — Other Ambulatory Visit (HOSPITAL_COMMUNITY): Payer: Self-pay

## 2022-04-17 ENCOUNTER — Inpatient Hospital Stay: Payer: Medicaid Other | Attending: Hematology and Oncology | Admitting: Hematology and Oncology

## 2022-04-17 ENCOUNTER — Ambulatory Visit (INDEPENDENT_AMBULATORY_CARE_PROVIDER_SITE_OTHER): Payer: Medicaid Other | Admitting: Emergency Medicine

## 2022-04-17 ENCOUNTER — Encounter: Payer: Self-pay | Admitting: Emergency Medicine

## 2022-04-17 DIAGNOSIS — C7801 Secondary malignant neoplasm of right lung: Secondary | ICD-10-CM

## 2022-04-17 DIAGNOSIS — C7802 Secondary malignant neoplasm of left lung: Secondary | ICD-10-CM | POA: Insufficient documentation

## 2022-04-17 DIAGNOSIS — C049 Malignant neoplasm of floor of mouth, unspecified: Secondary | ICD-10-CM | POA: Insufficient documentation

## 2022-04-17 NOTE — Progress Notes (Signed)
Subjective:    Patient ID: Casey [name], male    DOB: 12/14/1965, 56 y.o.   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  He has been followed with surveillance imaging and has had pulmonary nodules noted that have been slowly increasing in size  CT chest 10/14/2021 reviewed by me showed no mediastinal or hilar adenopathy, centrilobular emphysematous change, multiple pulmonary nodules including a cavitary 1.2 x 0.8 cm right upper lobe nodule, 9 x 7 mm left upper lobe nodule right lower lobe subpleural 11 x 7 mm nodule all of which are slightly larger than on prior imaging.  He has a groundglass nodule in the lingula that is stable in size, new 5 mm solid subpleural nodule in left lower lobe.  CT chest abdomen pelvis 02/05/2022 reviewed by me shows continued increase in size of his multiple small pulmonary nodules indicative of progressive metastatic disease, largest in the bilateral upper lobes 16 mm on the right, 16 mm on the left.  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  He underwent navigational bronchoscopy and biopsies on 03/31/2022.  Pathology revealed squamous cell cancer, presumed metastatic from his tongue cancer bilaterally.  Today he reports that he is having abdominal pain, nausea and near emesis. He hasn't see Dr Chryl Heck - didn't know about his OV w her this am. No breathing trouble. He does cough - still smokes 2-3 cig.    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   Social  History   Socioeconomic History   Marital status: Single    Spouse name: Not on file   Number of children: Not on file   Years of education: Not on file   Highest education level: Not on file  Occupational History   Not on file  Tobacco Use   Smoking status: Every Day    Packs/day: 0.50    Types: Cigarettes   Smokeless tobacco: Never   Tobacco comments:    32 cigarettes smoked daily 04/17/22 [facility]   Vaping Use   Vaping Use: Never used  Substance and Sexual Activity   Alcohol use: Not Currently    Comment: a 12 pack of beer a week   Drug use: Never   Sexual activity: Not Currently  Other Topics Concern   Not on file  Social History Narrative   Not on file   Social Determinants of Health   Financial Resource Strain: Not on file  Food Insecurity: Not on file  Transportation Needs: Unmet Transportation Needs (01/29/2022)   PRAPARE - Hydrologist (Medical): Not on file    Lack of Transportation (Non-Medical): Yes  Physical Activity: Not on file  Stress: Not on file  Social Connections: Not on file  Intimate Partner Violence: Not on file     No Known Allergies   Outpatient Medications Prior to Visit  Medication Sig Dispense Refill   buPROPion (WELLBUTRIN XL) 150 MG 24 hr tablet Take 1 tablet  by mouth daily. 60 tablet 1   chlorhexidine (PERIDEX) 0.12 % solution Rinse with 4 teaspoons and spit after use once to twice daily. 240 mL 1   dexamethasone (DECADRON) 1 MG tablet Take no more than 1 tablet every other day, PRN mouth swelling. 30 tablet 1   dicyclomine (BENTYL) 10 MG capsule Take 1 capsule (10 mg total) by mouth 3 (three) times daily as needed for spasms. 30 capsule 1   levothyroxine (SYNTHROID) 25 MCG tablet Take 3 tablets (75 mcg total) by mouth daily before breakfast. If nausea persists then reduce to 50 mcg 2 tablets daily 90 tablet 2   lidocaine (XYLOCAINE) 2 % solution MIX 1 PART 2% VISCOUS LIDOCAINE WITH 1 PART WATER. SWISH AND SPIT  10ML OF DILUTED MIXTURE TO NUMB MOUTH, UP TO 6 TIMES DAILY AS NEEDED 200 mL 4   Multiple Vitamin (MULTIVITAMIN) tablet Take 1 tablet by mouth daily.     ondansetron (ZOFRAN) 4 MG tablet Take 1 tablet (4 mg total) by mouth every 8 (eight) hours as needed for nausea or vomiting. 40 tablet 0   No facility-administered medications prior to visit.         Objective:   Physical Exam Vitals:   04/17/22 1437  BP: 130/70  Pulse: 70  Temp: 98.2 F (36.8 C)  TempSrc: Oral  SpO2: 96%  Weight: 116 lb 6.4 oz (52.8 kg)  Height: 5\' 3"  (1.6 m)   Gen: Pleasant, well-nourished, in no distress,  normal affect  ENT: No lesions, some thickening of the tongue especially on the left, difficulty raising his tongue, no postnasal drip  Neck: No JVD, no stridor  Lungs: No use of accessory muscles, no crackles or wheezing on normal respiration, no wheeze on forced expiration  Cardiovascular: RRR, heart sounds normal, no murmur or gallops, no peripheral edema  Musculoskeletal: No deformities, no cyanosis or clubbing  Neuro: alert, awake, non focal  Skin: Warm, no lesions or rash      Assessment & Plan:  Malignant neoplasm metastatic to lung [facility]) His bilateral nodular biopsies are consistent with squamous cell cancer, likely metastatic from his tongue cancer.  I had set him up to see Dr. Chryl Heck today but he missed that appointment.  In fact it seems that he does not remember talking to me on 8/29 when I told him about the bronchoscopy results.  I need to get him into see Dr. Chryl Heck ASAP.  He has a PET scan ordered but not scheduled.  We will work on scheduling this today so she has that information to review.  He may need an MRI brain as well.  I suspect that he is going to require chemotherapy.  We reviewed your biopsy results today. We need to get you into see Dr. Chryl Heck with soon as possible. We will get your PET scan scheduled so that Dr. Chryl Heck will have this test available to review Follow Dr.  Lamonte Sakai if needed for any changes in your breathing or any new respiratory symptoms.   Baltazar Apo, MD, PhD 04/17/2022, 2:55 PM [facility] [phone] or if no answer before 7:00PM call [phone] For any issues after 7:00PM please call eLink [phone]

## 2022-04-17 NOTE — Patient Instructions (Signed)
We reviewed your biopsy results today. We need to get you into see Dr. Chryl Heck with soon as possible. We will get your PET scan scheduled so that Dr. Chryl Heck will have this test available to review Follow Dr. Lamonte Sakai if needed for any changes in your breathing or any new respiratory symptoms.

## 2022-04-17 NOTE — Assessment & Plan Note (Addendum)
His bilateral nodular biopsies are consistent with squamous cell cancer, likely metastatic from his tongue cancer.  I had set him up to see Dr. Chryl Heck today but he missed that appointment.  In fact it seems that he does not remember talking to me on 8/29 when I told him about the bronchoscopy results.  I need to get him into see Dr. Chryl Heck ASAP.  He has a PET scan ordered but not scheduled.  We will work on scheduling this today so she has that information to review.  He may need an MRI brain as well.  I suspect that he is going to require chemotherapy.  We reviewed your biopsy results today. We need to get you into see Dr. Chryl Heck with soon as possible. We will get your PET scan scheduled so that Dr. Chryl Heck will have this test available to review Follow Dr. Lamonte Sakai if needed for any changes in your breathing or any new respiratory symptoms.

## 2022-04-23 ENCOUNTER — Encounter: Payer: Self-pay | Admitting: Hematology and Oncology

## 2022-04-23 ENCOUNTER — Inpatient Hospital Stay: Payer: Medicaid Other

## 2022-04-23 ENCOUNTER — Other Ambulatory Visit: Payer: Self-pay

## 2022-04-23 ENCOUNTER — Inpatient Hospital Stay (HOSPITAL_BASED_OUTPATIENT_CLINIC_OR_DEPARTMENT_OTHER): Payer: Medicaid Other | Admitting: Hematology and Oncology

## 2022-04-23 VITALS — BP 114/82 | HR 68 | Temp 99.9°F | Resp 16 | Ht 63.0 in | Wt 118.8 lb

## 2022-04-23 DIAGNOSIS — C049 Malignant neoplasm of floor of mouth, unspecified: Secondary | ICD-10-CM

## 2022-04-23 DIAGNOSIS — C7802 Secondary malignant neoplasm of left lung: Secondary | ICD-10-CM | POA: Diagnosis not present

## 2022-04-23 DIAGNOSIS — C7801 Secondary malignant neoplasm of right lung: Secondary | ICD-10-CM | POA: Diagnosis not present

## 2022-04-23 LAB — COMPREHENSIVE METABOLIC PANEL
ALT: 59 U/L — ABNORMAL HIGH (ref 0–44)
AST: 79 U/L — ABNORMAL HIGH (ref 15–41)
Albumin: 4.3 g/dL (ref 3.5–5.0)
Alkaline Phosphatase: 71 U/L (ref 38–126)
Anion gap: 9 (ref 5–15)
BUN: 10 mg/dL (ref 6–20)
CO2: 27 mmol/L (ref 22–32)
Calcium: 9.3 mg/dL (ref 8.9–10.3)
Chloride: 97 mmol/L — ABNORMAL LOW (ref 98–111)
Creatinine, Ser: 0.97 mg/dL (ref 0.61–1.24)
GFR, Estimated: >60 mL/min (ref >60)
Glucose, Bld: 86 mg/dL (ref 70–99)
Potassium: 3.8 mmol/L (ref 3.5–5.1)
Sodium: 133 mmol/L — ABNORMAL LOW (ref 135–145)
Total Bilirubin: 0.7 mg/dL (ref 0.3–1.2)
Total Protein: 7.5 g/dL (ref 6.5–8.1)

## 2022-04-23 LAB — CBC WITH DIFFERENTIAL/PLATELET
Abs Immature Granulocytes: 0.06 10*3/uL (ref 0.00–0.07)
Basophils Absolute: 0 10*3/uL (ref 0.0–0.1)
Basophils Relative: 1 %
Eosinophils Absolute: 0.1 10*3/uL (ref 0.0–0.5)
Eosinophils Relative: 1 %
HCT: 35.7 % — ABNORMAL LOW (ref 39.0–52.0)
Hemoglobin: 12.5 g/dL — ABNORMAL LOW (ref 13.0–17.0)
Immature Granulocytes: 1 %
Lymphocytes Relative: 15 %
Lymphs Abs: 0.8 10*3/uL (ref 0.7–4.0)
MCH: 34.1 pg — ABNORMAL HIGH (ref 26.0–34.0)
MCHC: 35 g/dL (ref 30.0–36.0)
MCV: 97.3 fL (ref 80.0–100.0)
Monocytes Absolute: 0.9 10*3/uL (ref 0.1–1.0)
Monocytes Relative: 16 %
Neutro Abs: 3.8 10*3/uL (ref 1.7–7.7)
Neutrophils Relative %: 66 %
Platelets: 212 10*3/uL (ref 150–400)
RBC: 3.67 MIL/uL — ABNORMAL LOW (ref 4.22–5.81)
RDW: 15.2 % (ref 11.5–15.5)
WBC: 5.6 10*3/uL (ref 4.0–10.5)
nRBC: 0 % (ref 0.0–0.2)

## 2022-04-23 NOTE — Progress Notes (Signed)
DISCONTINUE ON PATHWAY REGIMEN - Head and Neck     A cycle is every 21 days:     Cisplatin   **Always confirm dose/schedule in your pharmacy ordering system**  REASON: Disease Progression PRIOR TREATMENT: [id number]: Cisplatin 100 mg/m2 q21 Days x 3 Cycles with Concurrent Radiation TREATMENT RESPONSE: Progressive Disease (PD)  START ON PATHWAY REGIMEN - Head and Neck     A cycle is every 21 days:     Carboplatin      Fluorouracil      Pembrolizumab   **Always confirm dose/schedule in your pharmacy ordering system**  Patient Characteristics: Oral Cavity, Metastatic, First Line, No Prior Platinum-Based Chemoradiation within 6 Months, PD-L1 Expression Negative (CPS < 1) / Unknown Disease Classification: Oral Cavity AJCC T Category: T4a AJCC M Category: M1 AJCC N Category: cN2b AJCC 8 Stage Grouping: IVC Therapeutic Status: Metastatic Disease Line of Therapy: First Line Prior Platinum Status: No Prior Platinum-Based Chemoradiation within 6 Months PD-L1 Expression Status: Quantity Not Sufficient Intent of Therapy: Non-Curative / Palliative Intent, Discussed with Patient

## 2022-04-23 NOTE — Progress Notes (Unsigned)
Patient Care Team: Elsie Stain, MD as PCP - General (Pulmonary Disease) Malmfelt, Stephani Police, RN as Oncology Nurse Navigator Eppie Gibson, MD as Attending Physician (Radiation Oncology) Nicholas Lose, MD as Consulting Physician (Hematology and Oncology) Sharen Counter, CCC-SLP as Speech Language Pathologist (Speech Pathology) Wynelle Beckmann, Melodie Bouillon, PT as Physical Therapist (Physical Therapy) Karie Mainland, RD as Dietitian (Nutrition) Karie Mainland, RD as Dietitian (Nutrition) Beverely Pace, LCSW (Inactive) as Social Worker (General Practice)  DIAGNOSIS:  No diagnosis found.   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 CT maxillofacial on 12/22/19 showed postsurgical changes and mandibular invasion, with cervical lymphadenopathy consistent with metastatic nodes. PET scan on 02/16/20 showed the tongue mass with lymph node metastases on the right floor of the mouth and cervical lymph nodes.   03/07/2020 Cancer Staging   Staging form: Oral Cavity, AJCC 8th Edition - Clinical: Stage IVA (cT4a, cN2b, cM0) - Signed by Nicholas Lose, MD on 03/07/2020   04/06/2020 - 05/17/2020 Chemotherapy   Patient is on Treatment Plan : HEAD/NECK Cisplatin + XRT q21d       CHIEF COMPLIANT: Follow-up of floor of the mouth cancer  INTERVAL HISTORY: Casey [name] is a 56 y.o. with above-mentioned history of floor of the mouth cancer who completed treatment with concurrent chemoradiation treatment with every 3 week cisplatin now diagnosed with metastatic SCC. He is here with his sister and.  He was familiar with the lung nodules for a long time but he chose not to proceed with biopsy since they  were slowly progressive and he was asymptomatic.  He is now status post bronchoscopy and biopsy which confirmed metastatic squamous cell carcinoma.  I do not see a PD-L1 status on this.  He denies any complaints such as cough, chest pain or shortness of breath.  He only reports random episodes of nausea and vomiting.  Rest of the pertinent 10 point ROS reviewed and negative  ALLERGIES:  has No Known Allergies.  MEDICATIONS:  Current Outpatient Medications  Medication Sig Dispense Refill   buPROPion (WELLBUTRIN XL) 150 MG 24 hr tablet Take 1 tablet by mouth daily. 60 tablet 1   chlorhexidine (PERIDEX) 0.12 % solution Rinse with 4 teaspoons and spit after use once to twice daily. 240 mL 1   dexamethasone (DECADRON) 1 MG tablet Take no more than 1 tablet every other day, PRN mouth swelling. 30 tablet 1   dicyclomine (BENTYL) 10 MG capsule Take 1 capsule (10 mg total) by mouth 3 (three) times daily as needed for spasms. 30 capsule 1   levothyroxine (SYNTHROID) 25 MCG tablet Take 3 tablets (75 mcg total) by mouth daily before breakfast. If nausea persists then reduce to 50 mcg 2 tablets daily 90 tablet 2   lidocaine (XYLOCAINE) 2 % solution MIX 1 PART 2% VISCOUS LIDOCAINE WITH 1 PART WATER. SWISH AND SPIT 10ML OF DILUTED MIXTURE TO NUMB MOUTH, UP TO 6 TIMES DAILY AS NEEDED 200 mL 4   Multiple Vitamin (MULTIVITAMIN) tablet Take 1 tablet by mouth daily.     ondansetron (ZOFRAN) 4 MG tablet Take 1 tablet (4 mg total) by mouth every 8 (eight) hours as needed for nausea or vomiting. 40 tablet 0  No current facility-administered medications for this visit.    PHYSICAL EXAMINATION: ECOG PERFORMANCE STATUS: 1 - Symptomatic but completely ambulatory  There were no vitals filed for this visit.  There were no vitals filed for this visit.  PE deferred in lieu of counseling  LABORATORY DATA:  I have reviewed the data as listed    Latest Ref Rng & Units 01/31/2022   12:38 PM 09/17/2021    3:11 PM  06/17/2021   11:42 AM  CMP  Glucose 70 - 99 mg/dL 103  64  78   BUN 6 - 20 mg/dL _0 Creatinine 0.61 - 1.24 mg/dL 0.91  0.78  0.82   Sodium 135 - 145 mmol/L 135  136  134   Potassium 3.5 - 5.1 mmol/L 4.2  4.4  4.1   Chloride 98 - 111 mmol/L 100  95  96   CO2 22 - 32 mmol/L _1 Calcium 8.9 - 10.3 mg/dL 9.7  9.5  9.5   Total Protein 6.5 - 8.1 g/dL 7.5  7.5    Total Bilirubin 0.3 - 1.2 mg/dL 0.8  0.3    Alkaline Phos 38 - 126 U/L 70  84    AST 15 - 41 U/L 60  30    ALT 0 - 44 U/L 40  21      Lab Results  Component Value Date   WBC 5.0 03/31/2022   HGB 13.8 03/31/2022   HCT 39.0 03/31/2022   MCV 97.5 03/31/2022   PLT 215 03/31/2022   NEUTROABS 4.2 01/31/2022     ASSESSMENT & PLAN:  No problem-specific Assessment & Plan notes found for this encounter.  1. SCC of floor of mouth,  T4AN2B M0 stage IVa clinical stage   PET scan on 02/16/20 showed the tongue mass with lymph node metastases on the right floor of the mouth and cervical lymph nodes. CK7 and CD 117 are negative, p63 and CK 5/6 are positive, p16 is negative Completed 3 Cycles of cisplatin (started 04/06/2020) with XRT  PET CT scan 08/28/2020: Increased tongue hypermetabolism SUV 15.2 more diffuse including posteriorly suggestive of progressive local disease.  Resolution of thoracic nodal hypermetabolism.  No evidence of extrathoracic metastases. Repeat biopsy showed  Focal necroinflammatory debris.  No carcinoma identified.  No  squamous mucosa present for evaluation.   He had CT chest in November 2022 which showed multiple new small pulmonary nodules consistent with pulmonary metastatic disease.  CT soft tissue neck showed satisfactory posttreatment appearance of the neck.  PET/CT done in December 2022 showed FDG avid pulmonary nodules in the upper lobes with enlarging base nodule in the right lung base suspicious for metastatic disease.  [name] met him in December 2022 to discuss the imaging findings  discussed about offering SBRT versus biopsy versus referral for systemic therapy.  He elected to monitor.    He then had imaging in March 2023 which showed several enlarging pulmonary nodules consistent with metastatic disease, patchy groundglass opacities in the left upper lobe and right lower lobe which are likely inflammatory.  He said he was not ready for a biopsy, he does not want to proceed with any treatment at this time but would rather monitor.  He understands that this could progress to widespread metastatic disease and may become symptomatic and hard to treat at a later date.    He was supposed to come back in 3 months with repeat scans.  He however had the scan scheduled but canceled since he was not feeling well.  He did not reschedule this.  Have strongly encouraged him to get the scan scheduled as soon as possible.  I do not believe nausea and vomiting are related to his head and neck cancer.   He finally had CT imaging in July 2023 which showed progressive metastatic disease to the lungs, increased size of numerous pre-existing pulmonary nodules.  No extrapulmonary metastatic disease.  He then underwent bronchoscopy and biopsy of the left upper lobe lung nodule which showed squamous cell carcinoma He is here to discuss treatment planning given the metastatic squamous cell carcinoma along with his sister Ms. Ann.  We have today discussed the incurable nature of the disease, intent of treatment which is palliative.  We have discussed the median overall survival with chemoimmunotherapy.  We have discussed about considering carbo/5-FU with [facility] frontline for management of metastatic head and neck cancer.  We have discussed about mechanism of action, adverse effects of chemoimmunotherapy including but not limited to fatigue, nausea, vomiting, diarrhea, increased risk of infections, coronary vasospasm, mucositis, severe toxicity and DPD deficiency patients.  We have also discussed about  immunotherapy toxicity which can occasionally be fatal.  Most common side effects from immunotherapy or fatigue, hypo-/hyperthyroidism, hepatitis however rare side effect such as pneumonitis myocarditis, hypophysitis etc. and immunotherapy can eventually affect any organ.  He understands adverse effects and he would like to proceed with treatment.  Total time spent: 40 mins including face to face time and time spent for planning, charting and coordination of care. I have independently reviewed most recent PET imaging.  Benay Pike, MD 04/23/2022

## 2022-04-24 ENCOUNTER — Encounter: Payer: Self-pay | Admitting: Hematology and Oncology

## 2022-04-24 ENCOUNTER — Other Ambulatory Visit (HOSPITAL_COMMUNITY): Payer: Self-pay

## 2022-04-24 ENCOUNTER — Other Ambulatory Visit: Payer: Self-pay

## 2022-04-24 NOTE — Progress Notes (Signed)
Oncology Nurse Navigator Documentation   At Dr. Rob Hickman request, I have called Varney Biles with pathology and requested PDL1 testing be added to his biopsy from 03/31/22. She verbalized that she would request the test.   [name] RN, BSN, OCN Head & Neck Oncology Nurse [place] at [facility] Phone # [phone]  Fax # [phone]

## 2022-04-25 ENCOUNTER — Other Ambulatory Visit: Payer: Self-pay | Admitting: Critical Care Medicine

## 2022-04-25 ENCOUNTER — Other Ambulatory Visit (HOSPITAL_COMMUNITY): Payer: Self-pay

## 2022-04-25 MED ORDER — ONDANSETRON HCL 4 MG PO TABS
4.0000 mg | ORAL_TABLET | Freq: Three times a day (TID) | ORAL | 0 refills | Status: DC | PRN
  Filled 2022-04-25: qty 40, 14d supply, fill #0

## 2022-04-25 NOTE — Telephone Encounter (Signed)
Copied from [place] [id number]. Topic: General - Other >> Apr 25, 2022 10:07 AM Everette C wrote: Reason for CRM: Medication Refill - Medication: Rx #: [id number]  ondansetron (ZOFRAN) 4 MG tablet [[id number]]   Has the patient contacted their pharmacy? Yes.   (Agent: If no, request that the patient contact the pharmacy for the refill. If patient does not wish to contact the pharmacy document the reason why and proceed with request.) (Agent: If yes, when and what did the pharmacy advise?)  Preferred Pharmacy (with phone number or street name): [facility] [address] Phone: [phone] Fax: [phone] Hours: Mon-Fri 7:30am-6pm; Sat 8:00am-4:30pm   Has the patient been seen for an appointment in the last year OR does the patient have an upcoming appointment? Yes.    Agent: Please be advised that RX refills may take up to 3 business days. We ask that you follow-up with your pharmacy.

## 2022-04-25 NOTE — Telephone Encounter (Signed)
Requested medication (s) are due for refill today: yes  Requested medication (s) are on the active medication list: yes  Last refill:  02/25/22 #40/0  Future visit scheduled: yes  Notes to clinic:  Unable to refill per protocol, cannot delegate.      Requested Prescriptions  Pending Prescriptions Disp Refills   ondansetron (ZOFRAN) 4 MG tablet 40 tablet 0    Sig: Take 1 tablet (4 mg total) by mouth every 8 (eight) hours as needed for nausea or vomiting.     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

## 2022-04-28 ENCOUNTER — Other Ambulatory Visit (HOSPITAL_COMMUNITY): Payer: Self-pay

## 2022-04-28 NOTE — Progress Notes (Signed)
Pharmacist Chemotherapy Monitoring - Initial Assessment    Anticipated start date: 05/05/22   The following has been reviewed per standard work regarding the patient's treatment regimen: The patient's diagnosis, treatment plan and drug doses, and organ/hematologic function Lab orders and baseline tests specific to treatment regimen  The treatment plan start date, drug sequencing, and pre-medications Prior authorization status  Patient's documented medication list, including drug-drug interaction screen and prescriptions for anti-emetics and supportive care specific to the treatment regimen The drug concentrations, fluid compatibility, administration routes, and timing of the medications to be used The patient's access for treatment and lifetime cumulative dose history, if applicable  The patient's medication allergies and previous infusion related reactions, if applicable   Changes made to treatment plan:  N/A  Follow up needed:  N/A   Kennith Center, Pharm.D., CPP 04/28/2022@9 :56 AM

## 2022-04-29 ENCOUNTER — Other Ambulatory Visit (HOSPITAL_COMMUNITY): Payer: Self-pay

## 2022-04-30 ENCOUNTER — Other Ambulatory Visit: Payer: Self-pay | Admitting: Radiology

## 2022-04-30 ENCOUNTER — Other Ambulatory Visit (HOSPITAL_COMMUNITY): Payer: Self-pay | Admitting: Physician Assistant

## 2022-05-01 ENCOUNTER — Encounter (HOSPITAL_COMMUNITY): Payer: Self-pay

## 2022-05-01 ENCOUNTER — Other Ambulatory Visit: Payer: Self-pay

## 2022-05-01 ENCOUNTER — Ambulatory Visit (HOSPITAL_COMMUNITY)
Admission: RE | Admit: 2022-05-01 | Discharge: 2022-05-01 | Disposition: A | Discharge status: Home / Self Care | Payer: Medicaid Other | Source: Ambulatory Visit | Attending: Hematology and Oncology | Admitting: Hematology and Oncology

## 2022-05-01 DIAGNOSIS — C7801 Secondary malignant neoplasm of right lung: Secondary | ICD-10-CM | POA: Diagnosis not present

## 2022-05-01 DIAGNOSIS — C7802 Secondary malignant neoplasm of left lung: Secondary | ICD-10-CM | POA: Diagnosis not present

## 2022-05-01 DIAGNOSIS — Z87891 Personal history of nicotine dependence: Secondary | ICD-10-CM | POA: Insufficient documentation

## 2022-05-01 DIAGNOSIS — Z452 Encounter for adjustment and management of vascular access device: Secondary | ICD-10-CM | POA: Diagnosis not present

## 2022-05-01 DIAGNOSIS — C049 Malignant neoplasm of floor of mouth, unspecified: Secondary | ICD-10-CM | POA: Diagnosis not present

## 2022-05-01 HISTORY — PX: IR IMAGING GUIDED PORT INSERTION: IMG5740

## 2022-05-01 MED ORDER — FENTANYL CITRATE (PF) 100 MCG/2ML IJ SOLN
INTRAMUSCULAR | Status: AC
  Filled 2022-05-01: qty 2

## 2022-05-01 MED ORDER — HEPARIN SOD (PORK) LOCK FLUSH 100 UNIT/ML IV SOLN
INTRAVENOUS | Status: AC | PRN
  Administered 2022-05-01: 500 [IU] via INTRAVENOUS

## 2022-05-01 MED ORDER — MIDAZOLAM HCL 2 MG/2ML IJ SOLN
INTRAMUSCULAR | Status: AC | PRN
  Administered 2022-05-01: 1 mg via INTRAVENOUS

## 2022-05-01 MED ORDER — FENTANYL CITRATE (PF) 100 MCG/2ML IJ SOLN
INTRAMUSCULAR | Status: AC | PRN
  Administered 2022-05-01: 50 ug via INTRAVENOUS

## 2022-05-01 MED ORDER — SODIUM CHLORIDE 0.9 % IV SOLN: INTRAVENOUS | Status: DC

## 2022-05-01 MED ORDER — LIDOCAINE-EPINEPHRINE 1 %-1:100000 IJ SOLN
INTRAMUSCULAR | Status: AC | PRN
  Administered 2022-05-01: 10 mL via INTRADERMAL

## 2022-05-01 MED ORDER — MIDAZOLAM HCL 2 MG/2ML IJ SOLN
INTRAMUSCULAR | Status: AC | PRN
  Administered 2022-05-01: .5 mg via INTRAVENOUS

## 2022-05-01 MED ORDER — HEPARIN SOD (PORK) LOCK FLUSH 100 UNIT/ML IV SOLN
INTRAVENOUS | Status: AC
  Filled 2022-05-01: qty 5

## 2022-05-01 MED ORDER — MIDAZOLAM HCL 2 MG/2ML IJ SOLN
INTRAMUSCULAR | Status: AC
  Filled 2022-05-01: qty 4

## 2022-05-01 MED ORDER — LIDOCAINE-EPINEPHRINE 1 %-1:100000 IJ SOLN
INTRAMUSCULAR | Status: AC
  Filled 2022-05-01: qty 1

## 2022-05-01 NOTE — Discharge Instructions (Signed)
For questions /concerns may call Interventional Radiology at [phone] or  Interventional Radiology clinic [phone]   You may remove your dressing and shower tomorrow afternoon  DO NOT use EMLA cream for 2 weeks after port placement as the cream will remove surgical glue on your incision.    Implanted Port Insertion, Care After This sheet gives you information about how to care for yourself after your procedure. Your health care provider may also give you more specific instructions. If you have problems or questions, contact your health careprovider. What can I expect after the procedure? After the procedure, it is common to have: Discomfort at the port insertion site. Bruising on the skin over the port. This should improve over 3-4 days. Follow these instructions at home: [facility] care After your port is placed, you will get a manufacturer's information card. The card has information about your port. Keep this card with you at all times. Take care of the port as told by your health care provider. Ask your health care provider if you or a family member can get training for taking care of the port at home. A home health care nurse may also take care of the port. Make sure to remember what type of port you have. Incision care Follow instructions from your health care provider about how to take care of your port insertion site. Make sure you: Wash your hands with soap and water before and after you change your bandage (dressing). If soap and water are not available, use hand sanitizer. Change your dressing as told by your health care provider. Leave skin glue, or adhesive strips in place. These skin closures may need to stay in place for 2 weeks or longer.  Check your port insertion site every day for signs of infection. Check for:      - Redness, swelling, or pain.                     - Fluid or blood.      - Warmth.      - Pus or a bad smell. Activity Return to your normal activities as  told by your health care provider. Ask your health care provider what activities are safe for you. Do not lift anything that is heavier than 10 lb (4.5 kg), or the limit that you are told, until your health care provider says that it is safe. General instructions Take over-the-counter and prescription medicines only as told by your health care provider. Do not take baths, swim, or use a hot tub until your health care provider approves. Ask your health care provider if you may take showers. You may only be allowed to take sponge baths. Do not drive for 24 hours if you were given a sedative during your procedure. Wear a medical alert bracelet in case of an emergency. This will tell any health care providers that you have a port. Keep all follow-up visits as told by your health care provider. This is important. Contact a health care provider if: You cannot flush your port with saline as directed, or you cannot draw blood from the port. You have a fever or chills. You have redness, swelling, or pain around your port insertion site. You have fluid or blood coming from your port insertion site. Your port insertion site feels warm to the touch. You have pus or a bad smell coming from the port insertion site. Get help right away if: You have chest pain or shortness of  breath. You have bleeding from your port that you cannot control. Summary Take care of the port as told by your health care provider. Keep the manufacturer's information card with you at all times. Change your dressing as told by your health care provider. Contact a health care provider if you have a fever or chills or if you have redness, swelling, or pain around your port insertion site. Keep all follow-up visits as told by your health care provider. This information is not intended to replace advice given to you by your health care provider. Make sure you discuss any questions you have with your healthcare provider. Document Revised:  02/16/2018 Document Reviewed: 02/16/2018    Moderate Conscious Sedation, Adult, Care After This sheet gives you information about how to care for yourself after your procedure. Your health care provider may also give you more specific instructions. If you have problems or questions, contact your health careprovider. What can I expect after the procedure? After the procedure, it is common to have: Sleepiness for several hours. Impaired judgment for several hours. Difficulty with balance. Vomiting if you eat too soon. Follow these instructions at home: For the time period you were told by your health care provider: Rest. Do not participate in activities where you could fall or become injured. Do not drive or use machinery. Do not drink alcohol. Do not take sleeping pills or medicines that cause drowsiness. Do not make important decisions or sign legal documents. Do not take care of children on your own. Eating and drinking  Follow the diet recommended by your health care provider. Drink enough fluid to keep your urine pale yellow. If you vomit: Drink water, juice, or soup when you can drink without vomiting. Make sure you have little or no nausea before eating solid foods.  General instructions Take over-the-counter and prescription medicines only as told by your health care provider. Have a responsible adult stay with you for the time you are told. It is important to have someone help care for you until you are awake and alert. Do not smoke. Keep all follow-up visits as told by your health care provider. This is important. Contact a health care provider if: You are still sleepy or having trouble with balance after 24 hours. You feel light-headed. You keep feeling nauseous or you keep vomiting. You develop a rash. You have a fever. You have redness or swelling around the IV site. Get help right away if: You have trouble breathing. You have new-onset confusion at  home. Summary After the procedure, it is common to feel sleepy, have impaired judgment, or feel nauseous if you eat too soon. Rest after you get home. Know the things you should not do after the procedure. Follow the diet recommended by your health care provider and drink enough fluid to keep your urine pale yellow. Get help right away if you have trouble breathing or new-onset confusion at home. This information is not intended to replace advice given to you by your health care provider. Make sure you discuss any questions you have with your healthcare provider. Document Revised: 11/18/2019 Document Reviewed: 06/16/2019 Elsevier Patient Education  2022 [employer].

## 2022-05-01 NOTE — H&P (Addendum)
Referring Physician(s): Benay Pike  Supervising Physician: Corrie Mckusick  Patient Status:  [facility] OP  Chief Complaint: "I'm here for a port a cath"   Subjective: Patient familiar to IR service from Port-A-Cath and gastrostomy tube placements in 2021, removed in 2022.  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  He presents  today for Port-A-Cath placement to assist with treatment.  He denies fever, headache, chest pain, dyspnea, cough, abdominal/back pain, nausea, vomiting or bleeding.  He has had some weight loss.  Past Medical History:  Diagnosis Date   Arthritis    Depression    Hypothyroidism    squamous cell ca of floor of mouth 12/2018   Past Surgical History:  Procedure Laterality Date   BRONCHIAL BIOPSY  03/31/2022   Procedure: BRONCHIAL BIOPSIES;  Surgeon: Collene Gobble, MD;  Location: [facility] ENDOSCOPY;  Service: Pulmonary;;   BRONCHIAL BRUSHINGS  03/31/2022   Procedure: BRONCHIAL BRUSHINGS;  Surgeon: Collene Gobble, MD;  Location: [facility] ENDOSCOPY;  Service: Pulmonary;;   BRONCHIAL NEEDLE ASPIRATION BIOPSY  03/31/2022   Procedure: BRONCHIAL NEEDLE ASPIRATION BIOPSIES;  Surgeon: Collene Gobble, MD;  Location: [facility] ENDOSCOPY;  Service: Pulmonary;;   EXCISION OF TONGUE LESION Left 09/17/2020   Procedure: Biopsy of Anterior Tongue;  Surgeon: [name], MD;  Location: [place];  Service: ENT;  Laterality: Left;   IR GASTROSTOMY TUBE MOD SED  04/03/2020   IR GASTROSTOMY TUBE REMOVAL  10/03/2020   IR IMAGING GUIDED PORT INSERTION  04/03/2020   IR REMOVAL TUN ACCESS W/ PORT W/O FL MOD SED  10/03/2020   VIDEO BRONCHOSCOPY WITH RADIAL ENDOBRONCHIAL ULTRASOUND  03/31/2022   Procedure: VIDEO BRONCHOSCOPY WITH RADIAL ENDOBRONCHIAL ULTRASOUND;  Surgeon: Collene Gobble, MD;  Location: [facility] ENDOSCOPY;  Service: Pulmonary;;      Allergies: Patient has no known  allergies.  Medications: Prior to Admission medications   Medication Sig Start Date End Date Taking? Authorizing Provider  buPROPion (WELLBUTRIN XL) 150 MG 24 hr tablet Take 1 tablet by mouth daily. 02/24/22  Yes Elsie Stain, MD  chlorhexidine (PERIDEX) 0.12 % solution Rinse with 4 teaspoons and spit after use once to twice daily. 04/09/22  Yes Collene Gobble, MD  dexamethasone (DECADRON) 1 MG tablet Take no more than 1 tablet every other day, PRN mouth swelling. 03/01/21  Yes Eppie Gibson, MD  dicyclomine (BENTYL) 10 MG capsule Take 1 capsule (10 mg total) by mouth 3 (three) times daily as needed for spasms. 09/17/21  Yes Elsie Stain, MD  levothyroxine (SYNTHROID) 25 MCG tablet Take 3 tablets (75 mcg total) by mouth daily before breakfast. If nausea persists then reduce to 50 mcg 2 tablets daily 02/03/22  Yes Elsie Stain, MD  lidocaine (XYLOCAINE) 2 % solution MIX 1 PART 2% VISCOUS LIDOCAINE WITH 1 PART WATER. SWISH AND SPIT 10ML OF DILUTED MIXTURE TO NUMB MOUTH, UP TO 6 TIMES DAILY AS NEEDED 08/07/21 08/07/22 Yes Eppie Gibson, MD  ondansetron (ZOFRAN) 4 MG tablet Take 1 tablet (4 mg total) by mouth every 8 (eight) hours as needed for nausea or vomiting. 04/25/22  Yes Elsie Stain, MD  Multiple Vitamin (MULTIVITAMIN) tablet Take 1 tablet by mouth daily.    [provider]     Vital Signs:pend  Ht 5\' 3"  (1.6 m)   Wt 116 lb 13.5 oz (53 kg)   BMI 20.70 kg/m  Physical Exam awake, alert.  Chest with distant but clear  BS to auscultation bilaterally.  Heart with regular rate and rhythm.  Abdomen soft, positive bowel sounds, nontender.  No lower extremity edema.  Imaging: No results found.  Labs:  CBC: Recent Labs    09/17/21 1511 01/31/22 1238 03/31/22 0612 04/23/22 1553  WBC 6.5 5.8 5.0 5.6  HGB 13.0 13.9 13.8 12.5*  HCT 37.2* 39.8 39.0 35.7*  PLT 266 193 215 212    COAGS: No results for input(s): "INR", "APTT" in the last 8760 hours.  BMP: Recent  Labs    06/17/21 1142 09/17/21 1511 01/31/22 1238 04/23/22 1553  NA 134* 136 135 133*  K 4.1 4.4 4.2 3.8  CL 96* 95* 100 97*  CO2 27 26 28 27   GLUCOSE 78 64* 103* 86  BUN 6 9 8 10   CALCIUM 9.5 9.5 9.7 9.3  CREATININE 0.82 0.78 0.91 0.97  GFRNONAA >60  --  >60 >60    LIVER FUNCTION TESTS: Recent Labs    09/17/21 1511 01/31/22 1238 04/23/22 1553  BILITOT 0.3 0.8 0.7  AST 30 60* 79*  ALT 21 40 59*  ALKPHOS 84 70 71  PROT 7.5 7.5 7.5  ALBUMIN 5.0* 4.4 4.3    Assessment and Plan: Patient familiar to IR service from Port-A-Cath and gastrostomy tube placements in 2021, removed in 2022.  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  He presents  today for Port-A-Cath placement to assist with treatment. Risks and benefits of image guided port-a-catheter placement was discussed with the patient including, but not limited to bleeding, infection, pneumothorax, or fibrin sheath development and need for additional procedures.  All of the patient's questions were answered, patient is agreeable to proceed. Consent signed and in chart.    Electronically Signed: D. Rowe Robert, PA-C 05/01/2022, 1:05 PM   I spent a total of 15 Minutes at the the patient's bedside AND on the patient's hospital floor or unit, greater than 50% of which was counseling/coordinating care for port a cath placement

## 2022-05-01 NOTE — Procedures (Signed)
Interventional Radiology Procedure Note  Procedure: Placement of a right IJ approach single lumen PowerPort.  Tip is positioned at the superior cavoatrial junction and catheter is ready for immediate use.  Complications: None Recommendations:  - Ok to shower tomorrow - Do not submerge for 7 days - Routine line care   Signed,  Paislyn Domenico S. Dyshon Philbin, DO   

## 2022-05-02 MED FILL — Fosaprepitant Dimeglumine For IV Infusion 150 MG (Base Eq): INTRAVENOUS | Qty: 5 | Status: AC

## 2022-05-02 MED FILL — Dexamethasone Sodium Phosphate Inj 100 MG/10ML: INTRAMUSCULAR | Qty: 1 | Status: AC

## 2022-05-04 ENCOUNTER — Other Ambulatory Visit: Payer: Self-pay

## 2022-05-04 DIAGNOSIS — Z419 Encounter for procedure for purposes other than remedying health state, unspecified: Secondary | ICD-10-CM | POA: Diagnosis not present

## 2022-05-05 ENCOUNTER — Inpatient Hospital Stay (HOSPITAL_BASED_OUTPATIENT_CLINIC_OR_DEPARTMENT_OTHER): Payer: Medicaid Other | Admitting: Hematology and Oncology

## 2022-05-05 ENCOUNTER — Inpatient Hospital Stay: Payer: Medicaid Other | Attending: Hematology and Oncology

## 2022-05-05 ENCOUNTER — Encounter: Payer: Self-pay | Admitting: Hematology and Oncology

## 2022-05-05 ENCOUNTER — Inpatient Hospital Stay: Payer: Medicaid Other

## 2022-05-05 DIAGNOSIS — R200 Anesthesia of skin: Secondary | ICD-10-CM | POA: Insufficient documentation

## 2022-05-05 DIAGNOSIS — Z79890 Hormone replacement therapy (postmenopausal): Secondary | ICD-10-CM | POA: Diagnosis not present

## 2022-05-05 DIAGNOSIS — C7801 Secondary malignant neoplasm of right lung: Secondary | ICD-10-CM

## 2022-05-05 DIAGNOSIS — J984 Other disorders of lung: Secondary | ICD-10-CM | POA: Diagnosis not present

## 2022-05-05 DIAGNOSIS — I514 Myocarditis, unspecified: Secondary | ICD-10-CM | POA: Insufficient documentation

## 2022-05-05 DIAGNOSIS — I2510 Atherosclerotic heart disease of native coronary artery without angina pectoris: Secondary | ICD-10-CM | POA: Insufficient documentation

## 2022-05-05 DIAGNOSIS — E039 Hypothyroidism, unspecified: Secondary | ICD-10-CM | POA: Diagnosis not present

## 2022-05-05 DIAGNOSIS — E785 Hyperlipidemia, unspecified: Secondary | ICD-10-CM | POA: Insufficient documentation

## 2022-05-05 DIAGNOSIS — Z923 Personal history of irradiation: Secondary | ICD-10-CM | POA: Diagnosis not present

## 2022-05-05 DIAGNOSIS — I700 Atherosclerosis of aorta: Secondary | ICD-10-CM | POA: Diagnosis not present

## 2022-05-05 DIAGNOSIS — C7802 Secondary malignant neoplasm of left lung: Secondary | ICD-10-CM

## 2022-05-05 DIAGNOSIS — C779 Secondary and unspecified malignant neoplasm of lymph node, unspecified: Secondary | ICD-10-CM | POA: Diagnosis not present

## 2022-05-05 DIAGNOSIS — E0590 Thyrotoxicosis, unspecified without thyrotoxic crisis or storm: Secondary | ICD-10-CM | POA: Diagnosis not present

## 2022-05-05 DIAGNOSIS — F17210 Nicotine dependence, cigarettes, uncomplicated: Secondary | ICD-10-CM | POA: Diagnosis not present

## 2022-05-05 DIAGNOSIS — R633 Feeding difficulties, unspecified: Secondary | ICD-10-CM | POA: Diagnosis not present

## 2022-05-05 DIAGNOSIS — K121 Other forms of stomatitis: Secondary | ICD-10-CM | POA: Diagnosis not present

## 2022-05-05 DIAGNOSIS — J432 Centrilobular emphysema: Secondary | ICD-10-CM | POA: Insufficient documentation

## 2022-05-05 DIAGNOSIS — Z5111 Encounter for antineoplastic chemotherapy: Secondary | ICD-10-CM | POA: Insufficient documentation

## 2022-05-05 DIAGNOSIS — C049 Malignant neoplasm of floor of mouth, unspecified: Secondary | ICD-10-CM | POA: Insufficient documentation

## 2022-05-05 DIAGNOSIS — Z9221 Personal history of antineoplastic chemotherapy: Secondary | ICD-10-CM | POA: Diagnosis not present

## 2022-05-05 LAB — CMP (CANCER CENTER ONLY)
ALT: 20 U/L (ref 0–44)
AST: 24 U/L (ref 15–41)
Albumin: 4.4 g/dL (ref 3.5–5.0)
Alkaline Phosphatase: 61 U/L (ref 38–126)
Anion gap: 6 (ref 5–15)
BUN: 9 mg/dL (ref 6–20)
CO2: 26 mmol/L (ref 22–32)
Calcium: 9.3 mg/dL (ref 8.9–10.3)
Chloride: 100 mmol/L (ref 98–111)
Creatinine: 0.96 mg/dL (ref 0.61–1.24)
GFR, Estimated: >60 mL/min (ref >60)
Glucose, Bld: 103 mg/dL — ABNORMAL HIGH (ref 70–99)
Potassium: 4 mmol/L (ref 3.5–5.1)
Sodium: 132 mmol/L — ABNORMAL LOW (ref 135–145)
Total Bilirubin: 0.5 mg/dL (ref 0.3–1.2)
Total Protein: 7 g/dL (ref 6.5–8.1)

## 2022-05-05 LAB — CBC WITH DIFFERENTIAL (CANCER CENTER ONLY)
Abs Immature Granulocytes: 0.03 10*3/uL (ref 0.00–0.07)
Basophils Absolute: 0.1 10*3/uL (ref 0.0–0.1)
Basophils Relative: 1 %
Eosinophils Absolute: 0.1 10*3/uL (ref 0.0–0.5)
Eosinophils Relative: 2 %
HCT: 36.5 % — ABNORMAL LOW (ref 39.0–52.0)
Hemoglobin: 12.8 g/dL — ABNORMAL LOW (ref 13.0–17.0)
Immature Granulocytes: 1 %
Lymphocytes Relative: 13 %
Lymphs Abs: 0.8 10*3/uL (ref 0.7–4.0)
MCH: 33.5 pg (ref 26.0–34.0)
MCHC: 35.1 g/dL (ref 30.0–36.0)
MCV: 95.5 fL (ref 80.0–100.0)
Monocytes Absolute: 0.7 10*3/uL (ref 0.1–1.0)
Monocytes Relative: 12 %
Neutro Abs: 4.3 10*3/uL (ref 1.7–7.7)
Neutrophils Relative %: 71 %
Platelet Count: 284 10*3/uL (ref 150–400)
RBC: 3.82 MIL/uL — ABNORMAL LOW (ref 4.22–5.81)
RDW: 14.1 % (ref 11.5–15.5)
WBC Count: 6 10*3/uL (ref 4.0–10.5)
nRBC: 0 % (ref 0.0–0.2)

## 2022-05-05 LAB — TSH: TSH: 10.152 u[IU]/mL — ABNORMAL HIGH (ref 0.350–4.500)

## 2022-05-05 NOTE — Progress Notes (Signed)
Patient Care Team: Storm Frisk, MD as PCP - General (Pulmonary Disease) Malmfelt, Lise Auer, RN as Oncology Nurse Navigator Lonie Peak, MD as Attending Physician (Radiation Oncology) Barron Alvine, CCC-SLP as Speech Language Pathologist (Speech Pathology) Jeanella Craze, Remi Deter, PT as Physical Therapist (Physical Therapy) Anabel Bene, RD as Dietitian (Nutrition) Anabel Bene, RD as Dietitian (Nutrition) Sallee Lange, LCSW (Inactive) as Social Worker (General Practice) Rachel Moulds, MD as Consulting Physician (Hematology and Oncology)  DIAGNOSIS:    ICD-10-CM   1. Malignant neoplasm metastatic to both lungs [facility])  C78.01 Clinic Appointment Request   C78.02       SUMMARY OF ONCOLOGIC HISTORY: Oncology History  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 CT maxillofacial on 12/22/19 showed postsurgical changes and mandibular invasion, with cervical lymphadenopathy consistent with metastatic nodes. PET scan on 02/16/20 showed the tongue mass with lymph node metastases on the right floor of the mouth and cervical lymph nodes.   03/07/2020 Cancer Staging   Staging form: Oral Cavity, AJCC 8th Edition - Clinical: Stage IVA (cT4a, cN2b, cM0) - Signed by Serena Croissant, MD on 03/07/2020   04/06/2020 - 05/17/2020 Chemotherapy   Patient is on Treatment Plan : HEAD/NECK Cisplatin + XRT q21d     Malignant neoplasm metastatic to lung (HCC)  10/16/2021 Initial Diagnosis   Malignant neoplasm metastatic to lung (HCC)   05/19/2022 -  Chemotherapy   Patient is on Treatment Plan : HEAD/NECK Pembrolizumab (200) D1 + Carboplatin (5) D1 + 5FU (1000) IVCI D1-4 q21d x 6 cycles / Pembrolizumab (200) D1 q21d       CHIEF COMPLIANT:  Follow-up of floor of the mouth cancer  INTERVAL HISTORY: Casey [name] is a 56 y.o. with above-mentioned history of floor of the mouth cancer who completed treatment with concurrent chemoradiation treatment with every 3 week cisplatin now diagnosed with metastatic SCC. He is here for follow up. He didn't want to do chemotherapy today, he says sister is out of town and he is worried how he will react. He otherwise feels well, energy is the same. He is eating well. No new or worsening cough. No chest pain or SOB.  He once again tells me that he does not want to do immunotherapy.  He spent hours reviewing the literature.  He tells me that the side effects are quite scary and he understands it may add benefit to the combination but he does not want to consider immunotherapy at all.  He is willing to try chemotherapy.  Rest of the pertinent 10 point ROS reviewed and negative  ALLERGIES:  has No Known Allergies.  MEDICATIONS:  Current Outpatient Medications  Medication Sig Dispense Refill   buPROPion (WELLBUTRIN XL) 150 MG 24 hr tablet Take 1 tablet by mouth daily. 60 tablet 1   chlorhexidine (PERIDEX) 0.12 % solution Rinse with 4 teaspoons and spit after use once to twice daily. 240 mL 1   dexamethasone (DECADRON) 1 MG tablet Take no more than 1 tablet every other day, PRN mouth swelling. 30 tablet 1   dicyclomine (BENTYL) 10 MG capsule Take 1 capsule (10 mg total) by mouth 3 (three) times daily as needed for spasms. 30 capsule 1   levothyroxine (SYNTHROID) 25 MCG tablet Take 3  tablets (75 mcg total) by mouth daily before breakfast. If nausea persists then reduce to 50 mcg 2 tablets daily 90 tablet 2   lidocaine (XYLOCAINE) 2 % solution MIX 1 PART 2% VISCOUS LIDOCAINE WITH 1 PART WATER. SWISH AND SPIT 10ML OF DILUTED MIXTURE TO NUMB MOUTH, UP TO 6 TIMES DAILY AS NEEDED 200 mL 4   Multiple Vitamin (MULTIVITAMIN) tablet Take 1 tablet by mouth daily.     ondansetron (ZOFRAN) 4 MG tablet Take 1  tablet (4 mg total) by mouth every 8 (eight) hours as needed for nausea or vomiting. 40 tablet 0   No current facility-administered medications for this visit.    PHYSICAL EXAMINATION: ECOG PERFORMANCE STATUS: 1 - Symptomatic but completely ambulatory  Vitals:   05/05/22 1223  BP: (!) 131/92  Pulse: 71  Temp: 99.2 F (37.3 C)  SpO2: 100%   Filed Weights   05/05/22 1223  Weight: 112 lb 11.2 oz (51.1 kg)   PE deferred in lieu of counseling  LABORATORY DATA:  I have reviewed the data as listed    Latest Ref Rng & Units 04/23/2022    3:53 PM 01/31/2022   12:38 PM 09/17/2021    3:11 PM  CMP  Glucose 70 - 99 mg/dL 86  103  64   BUN 6 - 20 mg/dL $Remove'10  8  9   'YXlgZjM$ Creatinine 0.61 - 1.24 mg/dL 0.97  0.91  0.78   Sodium 135 - 145 mmol/L 133  135  136   Potassium 3.5 - 5.1 mmol/L 3.8  4.2  4.4   Chloride 98 - 111 mmol/L 97  100  95   CO2 22 - 32 mmol/L $RemoveB'27  28  26   'zhDnYSci$ Calcium 8.9 - 10.3 mg/dL 9.3  9.7  9.5   Total Protein 6.5 - 8.1 g/dL 7.5  7.5  7.5   Total Bilirubin 0.3 - 1.2 mg/dL 0.7  0.8  0.3   Alkaline Phos 38 - 126 U/L 71  70  84   AST 15 - 41 U/L 79  60  30   ALT 0 - 44 U/L 59  40  21     Lab Results  Component Value Date   WBC 6.0 05/05/2022   HGB 12.8 (L) 05/05/2022   HCT 36.5 (L) 05/05/2022   MCV 95.5 05/05/2022   PLT 284 05/05/2022   NEUTROABS 4.3 05/05/2022     ASSESSMENT & PLAN:  No problem-specific Assessment & Plan notes found for this encounter.  1. SCC of floor of mouth,  [id number] M0 stage IVa clinical stage   PET scan on 02/16/20 showed the tongue mass with lymph node metastases on the right floor of the mouth and cervical lymph nodes. CK7 and CD 117 are negative, p63 and CK 5/6 are positive, p16 is negative Completed 3 Cycles of cisplatin (started 04/06/2020) with XRT  PET CT scan 08/28/2020: Increased tongue hypermetabolism SUV 15.2 more diffuse including posteriorly suggestive of progressive local disease.  Resolution of thoracic nodal hypermetabolism.  No  evidence of extrathoracic metastases. Repeat biopsy showed  Focal necroinflammatory debris.  No carcinoma identified.  No  squamous mucosa present for evaluation.   He had CT chest in November 2022 which showed multiple new small pulmonary nodules consistent with pulmonary metastatic disease.  CT soft tissue neck showed satisfactory posttreatment appearance of the neck.  PET/CT done in December 2022 showed FDG avid pulmonary nodules in the upper lobes with enlarging base nodule in the right lung base suspicious  for metastatic disease.  [name] met him in December 2022 to discuss the imaging findings discussed about offering SBRT versus biopsy versus referral for systemic therapy.  He elected to monitor.    He then had imaging in March 2023 which showed several enlarging pulmonary nodules consistent with metastatic disease, patchy groundglass opacities in the left upper lobe and right lower lobe which are likely inflammatory.  He said he was not ready for a biopsy, he does not want to proceed with any treatment at this time but would rather monitor.  He understands that this could progress to widespread metastatic disease and may become symptomatic and hard to treat at a later date.    He was supposed to come back in 3 months with repeat scans.  He however had the scan scheduled but canceled since he was not feeling well.  He did not reschedule this.  Have strongly encouraged him to get the scan scheduled as soon as possible.  I do not believe nausea and vomiting are related to his head and neck cancer.   He finally had CT imaging in July 2023 which showed progressive metastatic disease to the lungs, increased size of numerous pre-existing pulmonary nodules.  No extrapulmonary metastatic disease.  He then underwent bronchoscopy and biopsy of the left upper lobe lung nodule which showed squamous cell carcinoma He is here to discuss treatment planning given the metastatic squamous cell carcinoma  along with his sister Ms. Ann.  We have today discussed the incurable nature of the disease, intent of treatment which is palliative.  We have discussed the median overall survival with chemoimmunotherapy.  We have discussed about considering carbo/5-FU with [facility] frontline for management of metastatic head and neck cancer.  We have discussed about mechanism of action, adverse effects of chemoimmunotherapy including but not limited to fatigue, nausea, vomiting, diarrhea, increased risk of infections, coronary vasospasm, mucositis, severe toxicity and DPD deficiency patients.  We have also discussed about immunotherapy toxicity which can occasionally be fatal.  Most common side effects from immunotherapy or fatigue, hypo-/hyperthyroidism, hepatitis however rare side effect such as pneumonitis myocarditis, hypophysitis etc. and immunotherapy can eventually affect any organ.    He is here for a follow up, he wants to delay treatment by 2 weeks, he says his sister is out of town and he doesn't want to start it until she comes back.  He says Beryle Flock is off the table. He doesn't want to do the immunotherapy.  We have once again discussed about the benefits and risks of immunotherapy.  He insists that Keytruda be removed from the treatment plan.  Total time spent: 30 mins including face to face time and time spent for planning, charting and coordination of care. I have independently reviewed most recent PET imaging.  Benay Pike, MD 05/05/2022

## 2022-05-06 LAB — T4: T4, Total: 5.8 ug/dL (ref 4.5–12.0)

## 2022-05-06 NOTE — Progress Notes (Signed)
Oncology Nurse Navigator Documentation   At Dr. Rob Hickman request, I contacted Casey [name] via email and advised him that his TSH level remains high. He is currently taking 50 mcg of Levothyroxine due to side effects when he took 75 mcg. Dr. Chryl Heck advised him to try to take 50 mcg one day and 75 mcg the next day, alternating every other day. He agreed to try this approach at this time. He will contact me if he has any difficulties.  Casey Asa RN, BSN, OCN Head & Neck Oncology Nurse [place] at [facility] Phone # [phone]  Fax # [phone]

## 2022-05-09 ENCOUNTER — Telehealth: Payer: Self-pay | Admitting: Emergency Medicine

## 2022-05-09 ENCOUNTER — Inpatient Hospital Stay: Payer: Medicaid Other

## 2022-05-09 NOTE — Telephone Encounter (Signed)
Called patient and left voicemail letting him know that you will be out until Tuesday

## 2022-05-09 NOTE — Telephone Encounter (Signed)
Copied from [place] [phone]. Topic: General - Other >> May 09, 2022  9:48 AM Leitha Schuller wrote: Pt requesting a cb from Dr. Joya Gaskins to get his opinion on his latest thyroid lab result  Pt declined appt

## 2022-05-11 NOTE — Telephone Encounter (Signed)
I called the pt, I clarified the advice Oncology gave him is satisfactory that he takes 75 alternate with 50 mcg of thyroid pill every other day  he understands

## 2022-05-15 ENCOUNTER — Other Ambulatory Visit (HOSPITAL_COMMUNITY): Payer: Self-pay

## 2022-05-15 ENCOUNTER — Other Ambulatory Visit: Payer: Self-pay | Admitting: Emergency Medicine

## 2022-05-16 MED FILL — Fosaprepitant Dimeglumine For IV Infusion 150 MG (Base Eq): INTRAVENOUS | Qty: 5 | Status: AC

## 2022-05-16 MED FILL — Dexamethasone Sodium Phosphate Inj 100 MG/10ML: INTRAMUSCULAR | Qty: 1 | Status: AC

## 2022-05-18 NOTE — Progress Notes (Unsigned)
[facility]:    Casey Stain, MD 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 CT maxillofacial on 12/22/19 showed postsurgical changes and mandibular invasion, with cervical lymphadenopathy consistent with metastatic nodes. PET scan on 02/16/20 showed the tongue mass with lymph node metastases on the right floor of the mouth and cervical lymph nodes.   03/07/2020 Cancer Staging   Staging form: Oral Cavity, AJCC 8th Edition - Clinical: Stage IVA (cT4a, cN2b, cM0) - Signed by Nicholas Lose, MD on 03/07/2020   04/06/2020 - 05/17/2020 Chemotherapy   Patient is on Treatment Plan : HEAD/NECK Cisplatin + XRT q21d     Malignant neoplasm metastatic to lung ([place])  10/16/2021 Initial Diagnosis   Malignant neoplasm metastatic to lung ([place])   05/19/2022 -  Chemotherapy   Patient is on Treatment Plan : HEAD/NECK Pembrolizumab (200) D1 + Carboplatin (5) D1 + 5FU (1000) IVCI D1-4 q21d x 6 cycles / Pembrolizumab (200) D1 q21d       CURRENT THERAPY:  INTERVAL HISTORY: Casey [name] 56 y.o. 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  MEDICAL HISTORY: Past Medical History:  Diagnosis Date   Arthritis    Depression    Hypothyroidism    squamous cell ca of floor of mouth 12/2018    SURGICAL HISTORY: Past Surgical History:  Procedure Laterality Date   BRONCHIAL BIOPSY  03/31/2022   Procedure: BRONCHIAL BIOPSIES;  Surgeon: Collene Gobble, MD;  Location: [facility];  Service: Pulmonary;;   BRONCHIAL BRUSHINGS  03/31/2022   Procedure: BRONCHIAL BRUSHINGS;  Surgeon: Collene Gobble, MD;  Location: [facility]COPY;  Service: Pulmonary;;   BRONCHIAL NEEDLE ASPIRATION BIOPSY  03/31/2022   Procedure: BRONCHIAL NEEDLE ASPIRATION BIOPSIES;  Surgeon: Collene Gobble, MD;  Location: [facility];  Service: Pulmonary;;   EXCISION OF TONGUE LESION Left 09/17/2020   Procedure: Biopsy of Anterior Tongue;  Surgeon: [name], MD;  Location: [facility];  Service: ENT;  Laterality: Left;   IR GASTROSTOMY TUBE MOD SED  04/03/2020   IR GASTROSTOMY TUBE REMOVAL  10/03/2020   IR IMAGING GUIDED PORT INSERTION  04/03/2020   IR IMAGING GUIDED PORT INSERTION  05/01/2022   IR REMOVAL TUN ACCESS W/ PORT W/O FL MOD SED  10/03/2020   VIDEO BRONCHOSCOPY WITH RADIAL ENDOBRONCHIAL ULTRASOUND  03/31/2022   Procedure: VIDEO BRONCHOSCOPY WITH RADIAL ENDOBRONCHIAL ULTRASOUND;  Surgeon: Collene Gobble,  MD;  Location: [facility];  Service: Pulmonary;;    SOCIAL HISTORY: Social History   Socioeconomic History   Marital status: Single    Spouse name: Not on file   Number of children: Not on file   Years of education: Not on file   Highest education level: Not on file  Occupational History   Not on file  Tobacco Use   Smoking status: Every Day    Packs/day: 0.50     Types: Cigarettes   Smokeless tobacco: Never   Tobacco comments:    32 cigarettes smoked daily 04/17/22 [facility]   Vaping Use   Vaping Use: Never used  Substance and Sexual Activity   Alcohol use: Not Currently    Comment: a 12 pack of beer a week   Drug use: Never   Sexual activity: Not Currently  Other Topics Concern   Not on file  Social History Narrative   Not on file   Social Determinants of Health   Financial Resource Strain: Not on file  Food Insecurity: Not on file  Transportation Needs: Unmet Transportation Needs (01/29/2022)   PRAPARE - Hydrologist (Medical): Not on file    Lack of Transportation (Non-Medical): Yes  Physical Activity: Not on file  Stress: Not on file  Social Connections: Not on file  Intimate Partner Violence: Not on file    FAMILY HISTORY: No family history on file.  Review of Systems - Oncology    PHYSICAL EXAMINATION  ECOG PERFORMANCE STATUS: {CHL ONC ECOG PS:[phone]}  There were no vitals filed for this visit.  Physical Exam  LABORATORY DATA:  CBC    Component Value Date/Time   WBC 6.0 05/05/2022 1143   WBC 5.6 04/23/2022 1553   RBC 3.82 (L) 05/05/2022 1143   HGB 12.8 (L) 05/05/2022 1143   HGB 13.0 09/17/2021 1511   HCT 36.5 (L) 05/05/2022 1143   HCT 37.2 (L) 09/17/2021 1511   PLT 284 05/05/2022 1143   PLT 266 09/17/2021 1511   MCV 95.5 05/05/2022 1143   MCV 100 (H) 09/17/2021 1511   MCH 33.5 05/05/2022 1143   MCHC 35.1 05/05/2022 1143   RDW 14.1 05/05/2022 1143   RDW 13.3 09/17/2021 1511   LYMPHSABS 0.8 05/05/2022 1143   LYMPHSABS 1.3 09/17/2021 1511   MONOABS 0.7 05/05/2022 1143   EOSABS 0.1 05/05/2022 1143   EOSABS 0.2 09/17/2021 1511   BASOSABS 0.1 05/05/2022 1143   BASOSABS 0.1 09/17/2021 1511    CMP     Component Value Date/Time   NA 132 (L) 05/05/2022 1143   NA 136 09/17/2021 1511   K 4.0 05/05/2022 1143   CL 100 05/05/2022 1143   CO2 26 05/05/2022 1143   GLUCOSE 103 (H)  05/05/2022 1143   BUN 9 05/05/2022 1143   BUN 9 09/17/2021 1511   CREATININE 0.96 05/05/2022 1143   CALCIUM 9.3 05/05/2022 1143   PROT 7.0 05/05/2022 1143   PROT 7.5 09/17/2021 1511   ALBUMIN 4.4 05/05/2022 1143   ALBUMIN 5.0 (H) 09/17/2021 1511   AST 24 05/05/2022 1143   ALT 20 05/05/2022 1143   ALKPHOS 61 05/05/2022 1143   BILITOT 0.5 05/05/2022 1143   GFRNONAA >60 05/05/2022 1143   GFRAA >60 05/03/2020 0940       PENDING LABS:   RADIOGRAPHIC STUDIES:  No results found.   PATHOLOGY:     ASSESSMENT and THERAPY PLAN:   No problem-specific Assessment & Plan notes found for this encounter.  No orders of the defined types were placed in this encounter.   All questions were answered. The patient knows to call the clinic with any problems, questions or concerns. We can certainly see the patient much sooner if necessary. This note was electronically signed. Scot Dock, NP 05/18/2022

## 2022-05-19 ENCOUNTER — Other Ambulatory Visit (HOSPITAL_COMMUNITY): Payer: Self-pay

## 2022-05-19 ENCOUNTER — Inpatient Hospital Stay: Payer: Medicaid Other

## 2022-05-19 ENCOUNTER — Encounter: Payer: Self-pay | Admitting: Adult Health

## 2022-05-19 ENCOUNTER — Other Ambulatory Visit: Payer: Self-pay | Admitting: Hematology and Oncology

## 2022-05-19 ENCOUNTER — Other Ambulatory Visit: Payer: Self-pay

## 2022-05-19 ENCOUNTER — Inpatient Hospital Stay (HOSPITAL_BASED_OUTPATIENT_CLINIC_OR_DEPARTMENT_OTHER): Payer: Medicaid Other | Admitting: Adult Health

## 2022-05-19 VITALS — BP 174/97

## 2022-05-19 VITALS — BP 148/99 | HR 60 | Temp 97.8°F | Resp 18 | Ht 63.0 in | Wt 119.3 lb

## 2022-05-19 DIAGNOSIS — C7801 Secondary malignant neoplasm of right lung: Secondary | ICD-10-CM

## 2022-05-19 DIAGNOSIS — C049 Malignant neoplasm of floor of mouth, unspecified: Secondary | ICD-10-CM | POA: Diagnosis not present

## 2022-05-19 DIAGNOSIS — Z5111 Encounter for antineoplastic chemotherapy: Secondary | ICD-10-CM | POA: Diagnosis not present

## 2022-05-19 LAB — COMPREHENSIVE METABOLIC PANEL
ALT: 16 U/L (ref 0–44)
AST: 29 U/L (ref 15–41)
Albumin: 4.4 g/dL (ref 3.5–5.0)
Alkaline Phosphatase: 73 U/L (ref 38–126)
Anion gap: 7 (ref 5–15)
BUN: 8 mg/dL (ref 6–20)
CO2: 31 mmol/L (ref 22–32)
Calcium: 9.6 mg/dL (ref 8.9–10.3)
Chloride: 98 mmol/L (ref 98–111)
Creatinine, Ser: 0.85 mg/dL (ref 0.61–1.24)
GFR, Estimated: >60 mL/min (ref >60)
Glucose, Bld: 113 mg/dL — ABNORMAL HIGH (ref 70–99)
Potassium: 4.6 mmol/L (ref 3.5–5.1)
Sodium: 136 mmol/L (ref 135–145)
Total Bilirubin: 0.7 mg/dL (ref 0.3–1.2)
Total Protein: 7.7 g/dL (ref 6.5–8.1)

## 2022-05-19 LAB — CBC WITH DIFFERENTIAL/PLATELET
Abs Immature Granulocytes: 0.04 10*3/uL (ref 0.00–0.07)
Basophils Absolute: 0.1 10*3/uL (ref 0.0–0.1)
Basophils Relative: 1 %
Eosinophils Absolute: 0.2 10*3/uL (ref 0.0–0.5)
Eosinophils Relative: 2 %
HCT: 40.3 % (ref 39.0–52.0)
Hemoglobin: 13.9 g/dL (ref 13.0–17.0)
Immature Granulocytes: 1 %
Lymphocytes Relative: 20 %
Lymphs Abs: 1.4 10*3/uL (ref 0.7–4.0)
MCH: 33.7 pg (ref 26.0–34.0)
MCHC: 34.5 g/dL (ref 30.0–36.0)
MCV: 97.8 fL (ref 80.0–100.0)
Monocytes Absolute: 1 10*3/uL (ref 0.1–1.0)
Monocytes Relative: 14 %
Neutro Abs: 4.3 10*3/uL (ref 1.7–7.7)
Neutrophils Relative %: 62 %
Platelets: 250 10*3/uL (ref 150–400)
RBC: 4.12 MIL/uL — ABNORMAL LOW (ref 4.22–5.81)
RDW: 14.4 % (ref 11.5–15.5)
WBC: 7 10*3/uL (ref 4.0–10.5)
nRBC: 0 % (ref 0.0–0.2)

## 2022-05-19 MED ORDER — PALONOSETRON HCL INJECTION 0.25 MG/5ML
0.2500 mg | Freq: Once | INTRAVENOUS | Status: AC
  Administered 2022-05-19: 0.25 mg via INTRAVENOUS
  Filled 2022-05-19: qty 5

## 2022-05-19 MED ORDER — LIDOCAINE-PRILOCAINE 2.5-2.5 % EX CREA
1.0000 | TOPICAL_CREAM | CUTANEOUS | 0 refills | Status: DC | PRN
  Filled 2022-05-19: qty 30, 30d supply, fill #0

## 2022-05-19 MED ORDER — SODIUM CHLORIDE 0.9 % IV SOLN
10.0000 mg | Freq: Once | INTRAVENOUS | Status: AC
  Administered 2022-05-19: 10 mg via INTRAVENOUS
  Filled 2022-05-19: qty 1
  Filled 2022-05-19: qty 10

## 2022-05-19 MED ORDER — SODIUM CHLORIDE 0.9 % IV SOLN: Freq: Once | INTRAVENOUS | Status: AC

## 2022-05-19 MED ORDER — SODIUM CHLORIDE 0.9 % IV SOLN
388.8000 mg | Freq: Once | INTRAVENOUS | Status: AC
  Administered 2022-05-19: 390 mg via INTRAVENOUS
  Filled 2022-05-19 (×2): qty 39

## 2022-05-19 MED ORDER — SODIUM CHLORIDE 0.9 % IV SOLN
150.0000 mg | Freq: Once | INTRAVENOUS | Status: AC
  Administered 2022-05-19: 150 mg via INTRAVENOUS
  Filled 2022-05-19: qty 150
  Filled 2022-05-19: qty 5

## 2022-05-19 MED ORDER — SODIUM CHLORIDE 0.9 % IV SOLN
1000.0000 mg/m2/d | INTRAVENOUS | Status: DC
  Administered 2022-05-19: 6200 mg via INTRAVENOUS
  Filled 2022-05-19: qty 124

## 2022-05-19 NOTE — Progress Notes (Signed)
Erroneous entry

## 2022-05-19 NOTE — Progress Notes (Signed)
Patient's B/P is 148/99. Requested that infusion RN recheck prior to tx.

## 2022-05-19 NOTE — Progress Notes (Signed)
Patient requested refill on Emla cream. Refill request sent to pharmacy. Scheduling message sent to change all lab appointments to flush appointments.

## 2022-05-19 NOTE — Assessment & Plan Note (Signed)
Casey [name] is here today for follow-up and evaluation of his metastatic head neck cancer to begin therapy with carboplatin and 5-FU.  Casey [name] has no questions or concerns about his chemotherapy he understands the risks and benefits and is agreeable to go ahead and proceed with treatment.  He tells me he has his antinausea medicines and knows how to use them if needed.  We discussed his activity level and he plans on continuing to be active throughout his chemo.  He will return next week for labs and follow-up since this is his first cycle.  After that we will see him for labs, follow-up, and his chemo every 21 days.

## 2022-05-19 NOTE — Patient Instructions (Signed)
[facility]  Discharge Instructions: Thank you for choosing [facility] to provide your oncology and hematology care.   If you have a lab appointment with the [facility], please go directly to the [facility] and check in at the registration area.   Wear comfortable clothing and clothing appropriate for easy access to any Portacath or PICC line.   We strive to give you quality time with your provider. You may need to reschedule your appointment if you arrive late (15 or more minutes).  Arriving late affects you and other patients whose appointments are after yours.  Also, if you miss three or more appointments without notifying the office, you may be dismissed from the clinic at the provider's discretion.      For prescription refill requests, have your pharmacy contact our office and allow 72 hours for refills to be completed.    Today you received the following chemotherapy and/or immunotherapy agents carboplatin, fluorourcil      To help prevent nausea and vomiting after your treatment, we encourage you to take your nausea medication as directed.  BELOW ARE SYMPTOMS THAT SHOULD BE REPORTED IMMEDIATELY: *FEVER GREATER THAN 100.4 F (38 C) OR HIGHER *CHILLS OR SWEATING *NAUSEA AND VOMITING THAT IS NOT CONTROLLED WITH YOUR NAUSEA MEDICATION *UNUSUAL SHORTNESS OF BREATH *UNUSUAL BRUISING OR BLEEDING *URINARY PROBLEMS (pain or burning when urinating, or frequent urination) *BOWEL PROBLEMS (unusual diarrhea, constipation, pain near the anus) TENDERNESS IN MOUTH AND THROAT WITH OR WITHOUT PRESENCE OF ULCERS (sore throat, sores in mouth, or a toothache) UNUSUAL RASH, SWELLING OR PAIN  UNUSUAL VAGINAL DISCHARGE OR ITCHING   Items with * indicate a potential emergency and should be followed up as soon as possible or go to the Emergency Department if any problems should occur.  Please show the CHEMOTHERAPY ALERT CARD or IMMUNOTHERAPY ALERT CARD  at check-in to the Emergency Department and triage nurse.  Should you have questions after your visit or need to cancel or reschedule your appointment, please contact [facility]  Dept: [phone]  and follow the prompts.  Office hours are 8:00 a.m. to 4:30 p.m. Monday - Friday. Please note that voicemails left after 4:00 p.m. may not be returned until the following business day.  We are closed weekends and major holidays. You have access to a nurse at all times for urgent questions. Please call the main number to the clinic Dept: [phone] and follow the prompts.   For any non-urgent questions, you may also contact your provider using MyChart. We now offer e-Visits for anyone 71 and older to request care online for non-urgent symptoms. For details visit [email or web address].   Also download the MyChart app! Go to the app store, search "MyChart", open the app, select [facility], and log in with your MyChart username and password.  Masks are optional in the cancer centers. If you would like for your care team to wear a mask while they are taking care of you, please let them know. You may have one support person who is at least 58 years old accompany you for your appointments.

## 2022-05-21 ENCOUNTER — Other Ambulatory Visit: Payer: Self-pay | Admitting: *Deleted

## 2022-05-21 NOTE — Progress Notes (Signed)
Per call pt states he feels overall well with no noted side effects. Of note he routinely takes an antinausea med q am due to chronic nausea secondary to his thyroid.  He did ask about " how do I know this pump is working - I can see anything moving in the tube and the bag seems full"  This RN asked if he could see the little lines on the pump moving- as well as he can hear a little " zizzet" -  He states he does see this and hear this.  This RN explained the tubing is very thick and he cannot see the fluid movement.  Pt felt reassured with no further questions- he stated appreciation of call.

## 2022-05-22 ENCOUNTER — Telehealth: Payer: Self-pay | Admitting: Emergency Medicine

## 2022-05-22 ENCOUNTER — Other Ambulatory Visit: Payer: Self-pay

## 2022-05-22 ENCOUNTER — Other Ambulatory Visit: Payer: Self-pay | Admitting: Critical Care Medicine

## 2022-05-22 ENCOUNTER — Other Ambulatory Visit (HOSPITAL_COMMUNITY): Payer: Self-pay

## 2022-05-22 ENCOUNTER — Other Ambulatory Visit: Payer: Self-pay | Admitting: Hematology and Oncology

## 2022-05-22 ENCOUNTER — Other Ambulatory Visit: Payer: Self-pay | Admitting: Emergency Medicine

## 2022-05-22 MED ORDER — ONDANSETRON HCL 4 MG PO TABS
4.0000 mg | ORAL_TABLET | Freq: Three times a day (TID) | ORAL | 0 refills | Status: DC | PRN
  Filled 2022-05-22: qty 40, 14d supply, fill #0

## 2022-05-22 MED ORDER — CHLORHEXIDINE GLUCONATE 0.12 % MT SOLN
OROMUCOSAL | 3 refills | Status: DC
  Filled 2022-05-22: qty 473, 30d supply, fill #0
  Filled 2022-09-23: qty 473, 11d supply, fill #1
  Filled 2022-11-03: qty 473, 11d supply, fill #2

## 2022-05-22 NOTE — Telephone Encounter (Signed)
Filled 3 days ago. Gardiner Rhyme, RN

## 2022-05-22 NOTE — Telephone Encounter (Signed)
Medication Refill - Medication: ondansetron (ZOFRAN) 4 MG tablet   Pt called reporting that he needs refills for this because his thyroid medication always makes him nauseous. And he is almost completely out of his current supply.   Has the patient contacted their pharmacy? Yes.   (Agent: If no, request that the patient contact the pharmacy for the refill. If patient does not wish to contact the pharmacy document the reason why and proceed with request.) (Agent: If yes, when and what did the pharmacy advise?)  Preferred Pharmacy (with phone number or street name):  [place] [address]  Phone: [phone] Fax: [phone]   Has the patient been seen for an appointment in the last year OR does the patient have an upcoming appointment? Yes.    Agent: Please be advised that RX refills may take up to 3 business days. We ask that you follow-up with your pharmacy.

## 2022-05-22 NOTE — Telephone Encounter (Signed)
Called and spoke with patient. I sent in the Rx to patients pharmacy.   Nothing further needed.

## 2022-05-22 NOTE — Telephone Encounter (Signed)
Patient called to request a refill for his chlorhexidine (PERIDEX) 0.12 % solution [[id number].  He stated that the [facility] said they have also reached out regarding a refill and has not heard back from the doctor,  Please advise and call patient to discuss at [phone]

## 2022-05-22 NOTE — Telephone Encounter (Signed)
Ok to give 3 refills

## 2022-05-22 NOTE — Telephone Encounter (Signed)
Requested medication (s) are due for refill today:yes  Requested medication (s) are on the active medication list: yes    Last refill: 04/25/22  #40  0 refills  Future visit scheduled  no  Notes to clinic: Not delegated, please review  Requested Prescriptions  Pending Prescriptions Disp Refills   ondansetron (ZOFRAN) 4 MG tablet 40 tablet 0    Sig: Take 1 tablet (4 mg total) by mouth every 8 (eight) hours as needed for nausea or vomiting.     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

## 2022-05-22 NOTE — Telephone Encounter (Signed)
Called and spoke with patient. Patient stated that [facility] said they reached out to [place] for a refill. Patient stated that he needs refill.   RB, please advise.

## 2022-05-23 ENCOUNTER — Inpatient Hospital Stay: Payer: Medicaid Other

## 2022-05-23 VITALS — BP 148/78 | HR 75 | Temp 98.2°F | Resp 18

## 2022-05-23 DIAGNOSIS — C7801 Secondary malignant neoplasm of right lung: Secondary | ICD-10-CM

## 2022-05-23 DIAGNOSIS — Z95828 Presence of other vascular implants and grafts: Secondary | ICD-10-CM

## 2022-05-23 DIAGNOSIS — C049 Malignant neoplasm of floor of mouth, unspecified: Secondary | ICD-10-CM

## 2022-05-23 MED ORDER — SODIUM CHLORIDE 0.9 % IV SOLN: Freq: Once | INTRAVENOUS | Status: DC

## 2022-05-23 MED ORDER — HEPARIN SOD (PORK) LOCK FLUSH 100 UNIT/ML IV SOLN: 500.0000 [IU] | INTRAVENOUS | Status: DC | PRN

## 2022-05-26 ENCOUNTER — Ambulatory Visit: Payer: Medicaid Other | Admitting: Adult Health

## 2022-05-26 ENCOUNTER — Other Ambulatory Visit: Payer: Medicaid Other

## 2022-05-26 ENCOUNTER — Other Ambulatory Visit (HOSPITAL_COMMUNITY): Payer: Self-pay

## 2022-05-26 MED ORDER — LIDOCAINE VISCOUS HCL 2 % MT SOLN
OROMUCOSAL | 1 refills | Status: DC
  Filled 2022-05-26: qty 240, 8d supply, fill #0
  Filled 2022-05-28 – 2022-06-13 (×3): qty 240, 8d supply, fill #1

## 2022-05-26 NOTE — Progress Notes (Signed)
[facility] Follow up:    Casey Stain, MD 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 CT maxillofacial on 12/22/19 showed postsurgical changes and mandibular invasion, with cervical lymphadenopathy consistent with metastatic nodes. PET scan on 02/16/20 showed the tongue mass with lymph node metastases on the right floor of the mouth and cervical lymph nodes.   03/07/2020 Cancer Staging   Staging form: Oral Cavity, AJCC 8th Edition - Clinical: Stage IVA (cT4a, cN2b, cM0) - Signed by Nicholas Lose, MD on 03/07/2020   04/04/2020 - 05/23/2020 Radiation Therapy    Site Technique Total Dose (Gy) Dose per Fx (Gy) Completed Fx Beam Energies  Tongue: [id number] IMRT 70/70 2 35/35 6X    04/06/2020 - 05/17/2020 Chemotherapy   Patient is on Treatment Plan : HEAD/NECK Cisplatin + XRT q21d     02/06/2022 Imaging   IMPRESSION: 1. Progressive metastatic disease to the lungs as evidenced by increased size of numerous pre-existing pulmonary nodules, as above. No new pulmonary nodules are otherwise noted. No extra pulmonary metastatic disease noted elsewhere in the abdomen or pelvis. 2. Aortic atherosclerosis, in addition to left main and three-vessel coronary artery disease. Please note that although the presence of coronary artery calcium documents the presence of  coronary artery disease, the severity of this disease and any potential stenosis cannot be assessed on this non-gated CT examination. Assessment for potential risk factor modification, dietary therapy or pharmacologic therapy may be warranted, if clinically indicated. 3. Mild diffuse bronchial wall thickening with mild centrilobular and paraseptal emphysema; imaging findings suggestive of underlying COPD.     Electronically Signed   By: Vinnie Langton M.D.   On: 02/06/2022 08:46   03/31/2022 Initial Biopsy   Bronchoscopy LUNG, LUL, NEEDLE  BIOPSIES:  - Malignant cells consistent with squamous cell carcinoma   LUNG, LUL, BRUSHING:  - Malignant cells consistent with squamous cell carcinoma  PDL 0%, negative   Malignant neoplasm metastatic to lung ([facility])  10/16/2021 Initial Diagnosis   Malignant neoplasm metastatic to lung ([facility])   02/06/2022 Imaging   IMPRESSION: 1. Progressive metastatic disease to the lungs as evidenced by increased size of numerous pre-existing pulmonary nodules, as above. No new pulmonary nodules are otherwise noted. No extra pulmonary metastatic disease noted elsewhere in the abdomen or pelvis. 2. Aortic atherosclerosis, in addition to left main and three-vessel coronary artery disease. Please note that although the presence of coronary artery calcium documents the presence of coronary artery disease, the severity of this disease and any potential stenosis cannot be assessed on this non-gated CT examination. Assessment for potential risk factor modification, dietary therapy or pharmacologic therapy may be warranted, if clinically indicated. 3. Mild diffuse bronchial wall thickening with mild centrilobular and paraseptal emphysema; imaging findings suggestive of underlying COPD.     Electronically Signed   By: Vinnie Langton M.D.   On: 02/06/2022 08:46  03/31/2022 Initial Biopsy   Bronchoscopy LUNG, LUL, NEEDLE  BIOPSIES:  - Malignant cells  consistent with squamous cell carcinoma   LUNG, LUL, BRUSHING:  - Malignant cells consistent with squamous cell carcinoma  PDL 0%, negative   05/19/2022 -  Chemotherapy   Patient is on Treatment Plan : HEAD/NECK Pembrolizumab (200) D1 + Carboplatin (5) D1 + 5FU (1000) IVCI D1-4 q21d x 6 cycles / Pembrolizumab (200) D1 q21d       CURRENT THERAPY: Carboplatin, 5FU  INTERVAL HISTORY: Casey [name] 56 y.o. male returns for f/u after receiving his treatment last week.  He developed mucositis last week on Thursday that became substantially worse over the weekend. H e has had difficulty eating and drinking and has lost 6 pounds.  His mouth is painful and he is using lidocaine to swish and helpo alleviate the pain.  He informed me today that he does not wish to receive any further chemotherapy due to the significant pain and ulceration in his mouth.  He has been experiencing difficulty swallowing and eating.     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  MEDICAL HISTORY: Past Medical History:  Diagnosis Date   Arthritis    Depression    Hypothyroidism    squamous cell ca of floor of mouth 12/2018    SURGICAL HISTORY: Past Surgical History:  Procedure Laterality Date   BRONCHIAL BIOPSY  03/31/2022   Procedure: BRONCHIAL BIOPSIES;  Surgeon: Collene Gobble, MD;  [facility] END[facility]COPY;  Service: Pulmonary;;   BRONCHIAL BRUSHINGS  03/31/2022   Procedure: BRONCHIAL BRUSHINGS;  Surgeon: Collene Gobble, MD;  Location:  [facility] ENDOSCOPY;  Service: Pulmonary;;   BRONCHIAL NEEDLE ASPIRATION BIOPSY  03/31/2022   Procedure: BRONCHIAL NEEDLE ASPIRATION BIOPSIES;  Surgeon: Collene Gobble, MD;  Location: [facility] ENDOSCOPY;  Service: Pulmonary;;   EXCISION OF TONGUE LESION Left 09/17/2020   Procedure: Biopsy of Anterior Tongue;  Surgeon: [name], MD;  Location: [place];  Service: ENT;  Laterality: Left;   IR GASTROSTOMY TUBE MOD SED  04/03/2020   IR GASTROSTOMY TUBE REMOVAL  10/03/2020   IR IMAGING GUIDED PORT INSERTION  04/03/2020   IR IMAGING GUIDED PORT INSERTION  05/01/2022   IR REMOVAL TUN ACCESS W/ PORT W/O FL MOD SED  10/03/2020   VIDEO BRONCHOSCOPY WITH RADIAL ENDOBRONCHIAL ULTRASOUND  03/31/2022   Procedure: VIDEO BRONCHOSCOPY WITH RADIAL ENDOBRONCHIAL ULTRASOUND;  Surgeon: Collene Gobble, MD;  Location: [facility] ENDOSCOPY;  Service: Pulmonary;;    SOCIAL HISTORY: Social History   Socioeconomic History   Marital status: Single    Spouse name: Not on file   Number of children: Not on file   Years of education: Not on file   Highest education level: Not on file  Occupational History   Not on file  Tobacco Use   Smoking status: Every Day    Packs/day: 0.50    Types: Cigarettes   Smokeless tobacco: Never   Tobacco comments:    32 cigarettes smoked daily 04/17/22 [facility]   Vaping Use  Vaping Use: Never used  Substance and Sexual Activity   Alcohol use: Not Currently    Comment: a 12 pack of beer a week   Drug use: Never   Sexual activity: Not Currently  Other Topics Concern   Not on file  Social History Narrative   Not on file   Social Determinants of Health   Financial Resource Strain: Not on file  Food Insecurity: Not on file  Transportation Needs: Unmet Transportation Needs (01/29/2022)   PRAPARE - Hydrologist (Medical): Not on file    Lack of Transportation (Non-Medical): Yes  Physical Activity: Not on file  Stress: Not on file  Social Connections: Not on  file  Intimate Partner Violence: Not on file    FAMILY HISTORY: Non contributory  Review of Systems  Constitutional:  Positive for fatigue. Negative for appetite change, chills, fever and unexpected weight change.  HENT:   Positive for mouth sores, sore throat and trouble swallowing. Negative for hearing loss and lump/mass.   Eyes:  Negative for eye problems and icterus.  Respiratory:  Negative for chest tightness, cough and shortness of breath.   Cardiovascular:  Negative for chest pain, leg swelling and palpitations.  Gastrointestinal:  Negative for abdominal distention, abdominal pain, constipation, diarrhea, nausea and vomiting.  Endocrine: Negative for hot flashes.  Genitourinary:  Negative for difficulty urinating.   Musculoskeletal:  Negative for arthralgias.  Skin:  Negative for itching and rash.  Neurological:  Negative for dizziness, extremity weakness, headaches and numbness.  Hematological:  Negative for adenopathy. Does not bruise/bleed easily.  Psychiatric/Behavioral:  Negative for depression. The patient is not nervous/anxious.       PHYSICAL EXAMINATION  ECOG PERFORMANCE STATUS: 1 - Symptomatic but completely ambulatory  Vitals:   05/27/22 1239  BP: (!) 161/88  Pulse: 70  Resp: 18  Temp: 98.1 F (36.7 C)  SpO2: 100%    Physical Exam Constitutional:      General: He is not in acute distress.    Appearance: Normal appearance. He is not toxic-appearing.  HENT:     Head: Normocephalic and atraumatic.  Eyes:     General: No scleral icterus. Cardiovascular:     Rate and Rhythm: Normal rate and regular rhythm.     Pulses: Normal pulses.     Heart sounds: Normal heart sounds.  Pulmonary:     Effort: Pulmonary effort is normal.     Breath sounds: Normal breath sounds.  Abdominal:     General: Abdomen is flat. Bowel sounds are normal. There is no distension.     Palpations: Abdomen is soft.     Tenderness: There is no abdominal tenderness.   Musculoskeletal:        General: No swelling.     Cervical back: Neck supple.  Lymphadenopathy:     Cervical: No cervical adenopathy.  Skin:    General: Skin is warm and dry.     Findings: No rash.  Neurological:     General: No focal deficit present.     Mental Status: He is alert.  Psychiatric:        Mood and Affect: Mood normal.        Behavior: Behavior normal.     LABORATORY DATA:  CBC    Component Value Date/Time   WBC 6.5 05/27/2022 1157   RBC 3.38 (L) 05/27/2022 1157   HGB 11.6 (L) 05/27/2022 1157   HGB 12.8 (L) 05/05/2022 1143   HGB 13.0 09/17/2021 1511  HCT 31.6 (L) 05/27/2022 1157   HCT 37.2 (L) 09/17/2021 1511   PLT 235 05/27/2022 1157   PLT 284 05/05/2022 1143   PLT 266 09/17/2021 1511   MCV 93.5 05/27/2022 1157   MCV 100 (H) 09/17/2021 1511   MCH 34.3 (H) 05/27/2022 1157   MCHC 36.7 (H) 05/27/2022 1157   RDW 13.3 05/27/2022 1157   RDW 13.3 09/17/2021 1511   LYMPHSABS 0.7 05/27/2022 1157   LYMPHSABS 1.3 09/17/2021 1511   MONOABS 0.6 05/27/2022 1157   EOSABS 0.0 05/27/2022 1157   EOSABS 0.2 09/17/2021 1511   BASOSABS 0.0 05/27/2022 1157   BASOSABS 0.1 09/17/2021 1511    CMP     Component Value Date/Time   NA 130 (L) 05/27/2022 1157   NA 136 09/17/2021 1511   K 4.2 05/27/2022 1157   CL 97 (L) 05/27/2022 1157   CO2 27 05/27/2022 1157   GLUCOSE 102 (H) 05/27/2022 1157   BUN 19 05/27/2022 1157   BUN 9 09/17/2021 1511   CREATININE 1.02 05/27/2022 1157   CREATININE 0.96 05/05/2022 1143   CALCIUM 9.5 05/27/2022 1157   PROT 7.2 05/27/2022 1157   PROT 7.5 09/17/2021 1511   ALBUMIN 4.2 05/27/2022 1157   ALBUMIN 5.0 (H) 09/17/2021 1511   AST 22 05/27/2022 1157   AST 24 05/05/2022 1143   ALT 14 05/27/2022 1157   ALT 20 05/05/2022 1143   ALKPHOS 66 05/27/2022 1157   BILITOT 0.7 05/27/2022 1157   BILITOT 0.5 05/05/2022 1143   GFRNONAA >60 05/27/2022 1157   GFRNONAA >60 05/05/2022 1143   GFRAA >60 05/03/2020 0940        ASSESSMENT and  THERAPY PLAN:   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  He is cycle 1 day 8 of treatment.  Casey [name] did not tolerate his treatment very well he has extensive mucositis which is causing discomfort and dehydration.  He does not want to receive chemotherapy any further and we will respect that wish.  I recommended that he receive IV fluids daily for the next couple of days to help alleviate some of the dehydration.  In reviewing his labs his kidney function is slightly increased along with his sodium and chloride decreased.  He does have oral ulcerations and Candida noted on his oral exam today.  I wrote for Valtrex 1 g p.o. twice daily along with Diflucan 200 mg daily x5 days.  Hopefully this will help alleviate some of the root causes behind the mucositis.  The lidocaine swish and the Zofran are helping him and I placed refills for those today.  I also refilled his Wellbutrin at his request.  We will follow-up with him next week for labs and further discussion.   All questions were answered. The patient knows to call the clinic with any problems, questions or concerns. We can certainly see the patient much sooner if necessary.  Total encounter time:30 minutes*in face-to-face visit time, chart review, lab review, care coordination, order entry, and documentation of the encounter time.    Wilber Bihari, NP 05/27/22 4:16 PM Medical Oncology and Hematology [facility] [place], [place] [address] Tel. [phone]    Fax. [phone]  *Total Encounter Time as defined by the Centers for Medicare and Medicaid Services includes, in addition to the face-to-face time of a patient visit (documented in the note  above) non-face-to-face time: obtaining and reviewing outside history, ordering and reviewing medications, tests or  procedures, care coordination (communications with other health care professionals or caregivers) and documentation in the medical record.

## 2022-05-27 ENCOUNTER — Other Ambulatory Visit: Payer: Self-pay

## 2022-05-27 ENCOUNTER — Inpatient Hospital Stay: Payer: Medicaid Other

## 2022-05-27 ENCOUNTER — Other Ambulatory Visit (HOSPITAL_COMMUNITY): Payer: Self-pay

## 2022-05-27 ENCOUNTER — Inpatient Hospital Stay (HOSPITAL_BASED_OUTPATIENT_CLINIC_OR_DEPARTMENT_OTHER): Payer: Medicaid Other | Admitting: Adult Health

## 2022-05-27 VITALS — BP 161/88 | HR 70 | Temp 98.1°F | Resp 18 | Ht 63.0 in | Wt 113.6 lb

## 2022-05-27 DIAGNOSIS — C049 Malignant neoplasm of floor of mouth, unspecified: Secondary | ICD-10-CM

## 2022-05-27 DIAGNOSIS — Z5111 Encounter for antineoplastic chemotherapy: Secondary | ICD-10-CM | POA: Diagnosis not present

## 2022-05-27 DIAGNOSIS — C7801 Secondary malignant neoplasm of right lung: Secondary | ICD-10-CM

## 2022-05-27 LAB — CBC WITH DIFFERENTIAL/PLATELET
Abs Immature Granulocytes: 0.02 10*3/uL (ref 0.00–0.07)
Basophils Absolute: 0 10*3/uL (ref 0.0–0.1)
Basophils Relative: 1 %
Eosinophils Absolute: 0 10*3/uL (ref 0.0–0.5)
Eosinophils Relative: 1 %
HCT: 31.6 % — ABNORMAL LOW (ref 39.0–52.0)
Hemoglobin: 11.6 g/dL — ABNORMAL LOW (ref 13.0–17.0)
Immature Granulocytes: 0 %
Lymphocytes Relative: 11 %
Lymphs Abs: 0.7 10*3/uL (ref 0.7–4.0)
MCH: 34.3 pg — ABNORMAL HIGH (ref 26.0–34.0)
MCHC: 36.7 g/dL — ABNORMAL HIGH (ref 30.0–36.0)
MCV: 93.5 fL (ref 80.0–100.0)
Monocytes Absolute: 0.6 10*3/uL (ref 0.1–1.0)
Monocytes Relative: 9 %
Neutro Abs: 5.1 10*3/uL (ref 1.7–7.7)
Neutrophils Relative %: 78 %
Platelets: 235 10*3/uL (ref 150–400)
RBC: 3.38 MIL/uL — ABNORMAL LOW (ref 4.22–5.81)
RDW: 13.3 % (ref 11.5–15.5)
WBC: 6.5 10*3/uL (ref 4.0–10.5)
nRBC: 0 % (ref 0.0–0.2)

## 2022-05-27 LAB — COMPREHENSIVE METABOLIC PANEL
ALT: 14 U/L (ref 0–44)
AST: 22 U/L (ref 15–41)
Albumin: 4.2 g/dL (ref 3.5–5.0)
Alkaline Phosphatase: 66 U/L (ref 38–126)
Anion gap: 6 (ref 5–15)
BUN: 19 mg/dL (ref 6–20)
CO2: 27 mmol/L (ref 22–32)
Calcium: 9.5 mg/dL (ref 8.9–10.3)
Chloride: 97 mmol/L — ABNORMAL LOW (ref 98–111)
Creatinine, Ser: 1.02 mg/dL (ref 0.61–1.24)
GFR, Estimated: >60 mL/min (ref >60)
Glucose, Bld: 102 mg/dL — ABNORMAL HIGH (ref 70–99)
Potassium: 4.2 mmol/L (ref 3.5–5.1)
Sodium: 130 mmol/L — ABNORMAL LOW (ref 135–145)
Total Bilirubin: 0.7 mg/dL (ref 0.3–1.2)
Total Protein: 7.2 g/dL (ref 6.5–8.1)

## 2022-05-27 MED ORDER — BUPROPION HCL ER (XL) 150 MG PO TB24
150.0000 mg | ORAL_TABLET | Freq: Every day | ORAL | 1 refills | Status: DC
  Filled 2022-05-27: qty 60, 60d supply, fill #0
  Filled 2022-09-29: qty 60, 60d supply, fill #1

## 2022-05-27 MED ORDER — HEPARIN SOD (PORK) LOCK FLUSH 100 UNIT/ML IV SOLN
500.0000 [IU] | Freq: Once | INTRAVENOUS | Status: AC | PRN
  Administered 2022-05-27: 500 [IU]

## 2022-05-27 MED ORDER — FLUCONAZOLE 200 MG PO TABS
200.0000 mg | ORAL_TABLET | Freq: Every day | ORAL | 2 refills | Status: DC
  Filled 2022-05-27: qty 5, 5d supply, fill #0
  Filled 2022-06-02: qty 5, 5d supply, fill #1
  Filled 2022-06-07: qty 5, 5d supply, fill #2

## 2022-05-27 MED ORDER — SODIUM CHLORIDE 0.9 % IV SOLN: INTRAVENOUS | Status: DC

## 2022-05-27 MED ORDER — SODIUM CHLORIDE 0.9% FLUSH
10.0000 mL | Freq: Once | INTRAVENOUS | Status: AC | PRN
  Administered 2022-05-27: 10 mL

## 2022-05-27 MED ORDER — ONDANSETRON HCL 4 MG PO TABS
4.0000 mg | ORAL_TABLET | Freq: Three times a day (TID) | ORAL | 3 refills | Status: DC | PRN
  Filled 2022-05-27 – 2022-07-01 (×2): qty 40, 14d supply, fill #0
  Filled 2022-08-06: qty 40, 14d supply, fill #1
  Filled 2022-09-17: qty 40, 14d supply, fill #2

## 2022-05-27 MED ORDER — LIDOCAINE VISCOUS HCL 2 % MT SOLN
OROMUCOSAL | 4 refills | Status: DC
  Filled 2022-05-27: qty 200, 7d supply, fill #0
  Filled 2022-05-28: qty 200, 6d supply, fill #0
  Filled 2022-05-28: qty 100, 7d supply, fill #0

## 2022-05-27 MED ORDER — VALACYCLOVIR HCL 1 G PO TABS
1000.0000 mg | ORAL_TABLET | Freq: Two times a day (BID) | ORAL | 0 refills | Status: DC
  Filled 2022-05-27: qty 20, 10d supply, fill #0

## 2022-05-27 MED ORDER — SODIUM CHLORIDE 0.9 % IV SOLN: Freq: Once | INTRAVENOUS | Status: AC

## 2022-05-27 NOTE — Patient Instructions (Signed)
Dehydration, Adult Dehydration is a condition in which there is not enough water or other fluids in the body. This happens when a person loses more fluids than he or she takes in. Important organs, such as the kidneys, brain, and heart, cannot function without a proper amount of fluids. Any loss of fluids from the body can lead to dehydration. Dehydration can be mild, moderate, or severe. It should be treated right away to prevent it from becoming severe. What are the causes? Dehydration may be caused by: Conditions that cause loss of water or other fluids, such as diarrhea, vomiting, or sweating or urinating a lot. Not drinking enough fluids, especially when you are ill or doing activities that require a lot of energy. Other illnesses and conditions, such as fever or infection. Certain medicines, such as medicines that remove excess fluid from the body (diuretics). Lack of safe drinking water. Not being able to get enough water and food. What increases the risk? The following factors may make you more likely to develop this condition: Having a long-term (chronic) illness that has not been treated properly, such as diabetes, heart disease, or kidney disease. Being 65 years of age or older. Having a disability. Living in a place that is high in altitude, where thinner, drier air causes more fluid loss. Doing exercises that put stress on your body for a long time (endurance sports). What are the signs or symptoms? Symptoms of dehydration depend on how severe it is. Mild or moderate dehydration Thirst. Dry lips or dry mouth. Dizziness or light-headedness, especially when standing up from a seated position. Muscle cramps. Dark urine. Urine may be the color of tea. Less urine or tears produced than usual. Headache. Severe dehydration Changes in skin. Your skin may be cold and clammy, blotchy, or pale. Your skin also may not return to normal after being lightly pinched and released. Little or  no tears, urine, or sweat. Changes in vital signs, such as rapid breathing and low blood pressure. Your pulse may be weak or may be faster than 100 beats a minute when you are sitting still. Other changes, such as: Feeling very thirsty. Sunken eyes. Cold hands and feet. Confusion. Being very tired (lethargic) or having trouble waking from sleep. Short-term weight loss. Loss of consciousness. How is this diagnosed? This condition is diagnosed based on your symptoms and a physical exam. You may have blood and urine tests to help confirm the diagnosis. How is this treated? Treatment for this condition depends on how severe it is. Treatment should be started right away. Do not wait until dehydration becomes severe. Severe dehydration is an emergency and needs to be treated in a hospital. Mild or moderate dehydration can be treated at home. You may be asked to: Drink more fluids. Drink an oral rehydration solution (ORS). This drink helps restore proper amounts of fluids and salts and minerals in the blood (electrolytes). Severe dehydration can be treated: With IV fluids. By correcting abnormal levels of electrolytes. This is often done by giving electrolytes through a tube that is passed through your nose and into your stomach (nasogastric tube, or NG tube). By treating the underlying cause of dehydration. Follow these instructions at home: Oral rehydration solution If told by your health care provider, drink an ORS: Make an ORS by following instructions on the package. Start by drinking small amounts, about  cup (120 mL) every 5-10 minutes. Slowly increase how much you drink until you have taken the amount recommended by your health   care provider. Eating and drinking        Drink enough clear fluid to keep your urine pale yellow. If you were told to drink an ORS, finish the ORS first and then start slowly drinking other clear fluids. Drink fluids such as: Water. Do not drink only  water. Doing that can lead to hyponatremia, which is having too little salt (sodium) in the body. Water from ice chips you suck on. Fruit juice that you have added water to (diluted fruit juice). Low-calorie sports drinks. Eat foods that contain a healthy balance of electrolytes, such as bananas, oranges, potatoes, tomatoes, and spinach. Do not drink alcohol. Avoid the following: Drinks that contain a lot of sugar. These include high-calorie sports drinks, fruit juice that is not diluted, and soda. Caffeine. Foods that are greasy or contain a lot of fat or sugar. General instructions Take over-the-counter and prescription medicines only as told by your health care provider. Do not take sodium tablets. Doing that can lead to having too much sodium in the body (hypernatremia). Return to your normal activities as told by your health care provider. Ask your health care provider what activities are safe for you. Keep all follow-up visits as told by your health care provider. This is important. Contact a health care provider if: You have muscle cramps, pain, or discomfort, such as: Pain in your abdomen and the pain gets worse or stays in one area (localizes). Stiff neck. You have a rash. You are more irritable than usual. You are sleepier or have a harder time waking than usual. You feel weak or dizzy. You feel very thirsty. Get help right away if you have: Any symptoms of severe dehydration. Symptoms of vomiting, such as: You cannot eat or drink without vomiting. Vomiting gets worse or does not go away. Vomit includes blood or green matter (bile). Symptoms that get worse with treatment. A fever. A severe headache. Problems with urination or bowel movements, such as: Diarrhea that gets worse or does not go away. Blood in your stool (feces). This may cause stool to look black and tarry. Not urinating, or urinating only a small amount of very dark urine, within 6-8 hours. Trouble  breathing. These symptoms may represent a serious problem that is an emergency. Do not wait to see if the symptoms will go away. Get medical help right away. Call your local emergency services (911 in the [place] Do not drive yourself to the hospital. Summary Dehydration is a condition in which there is not enough water or other fluids in the body. This happens when a person loses more fluids than he or she takes in. Treatment for this condition depends on how severe it is. Treatment should be started right away. Do not wait until dehydration becomes severe. Drink enough clear fluid to keep your urine pale yellow. If you were told to drink an oral rehydration solution (ORS), finish the ORS first and then start slowly drinking other clear fluids. Take over-the-counter and prescription medicines only as told by your health care provider. Get help right away if you have any symptoms of severe dehydration. This information is not intended to replace advice given to you by your health care provider. Make sure you discuss any questions you have with your health care provider. Document Revised: 11/27/2021 Document Reviewed: 03/03/2019 Elsevier Patient Education  2023 [employer]  

## 2022-05-27 NOTE — Assessment & Plan Note (Signed)
Casey [name] is here today for follow-up and evaluation of his stage IV squamous cell carcinoma who received chemotherapy with carboplatin and 5-FU.  He is cycle 1 day 8 of treatment.  Casey [name] did not tolerate his treatment very well he has extensive mucositis which is causing discomfort and dehydration.  He does not want to receive chemotherapy any further and we will respect that wish.  I recommended that he receive IV fluids daily for the next couple of days to help alleviate some of the dehydration.  In reviewing his labs his kidney function is slightly increased along with his sodium and chloride decreased.  He does have oral ulcerations and Candida noted on his oral exam today.  I wrote for Valtrex 1 g p.o. twice daily along with Diflucan 200 mg daily x5 days.  Hopefully this will help alleviate some of the root causes behind the mucositis.  The lidocaine swish and the Zofran are helping him and I placed refills for those today.  I also refilled his Wellbutrin at his request.  We will follow-up with him next week for labs and further discussion.

## 2022-05-28 ENCOUNTER — Other Ambulatory Visit (HOSPITAL_COMMUNITY): Payer: Self-pay

## 2022-05-28 ENCOUNTER — Encounter: Payer: Self-pay | Admitting: Adult Health

## 2022-05-28 ENCOUNTER — Encounter: Payer: Self-pay | Admitting: Hematology and Oncology

## 2022-05-29 ENCOUNTER — Other Ambulatory Visit (HOSPITAL_COMMUNITY): Payer: Self-pay

## 2022-05-30 ENCOUNTER — Other Ambulatory Visit (HOSPITAL_COMMUNITY): Payer: Self-pay

## 2022-06-02 ENCOUNTER — Other Ambulatory Visit (HOSPITAL_COMMUNITY): Payer: Self-pay

## 2022-06-03 ENCOUNTER — Other Ambulatory Visit: Payer: Self-pay

## 2022-06-03 ENCOUNTER — Inpatient Hospital Stay (HOSPITAL_BASED_OUTPATIENT_CLINIC_OR_DEPARTMENT_OTHER): Payer: Medicaid Other | Admitting: Adult Health

## 2022-06-03 ENCOUNTER — Inpatient Hospital Stay: Payer: Medicaid Other

## 2022-06-03 ENCOUNTER — Other Ambulatory Visit (HOSPITAL_COMMUNITY): Payer: Self-pay

## 2022-06-03 VITALS — BP 140/71 | HR 78 | Temp 97.9°F | Resp 16 | Ht 63.0 in | Wt 117.3 lb

## 2022-06-03 DIAGNOSIS — C7801 Secondary malignant neoplasm of right lung: Secondary | ICD-10-CM

## 2022-06-03 DIAGNOSIS — Z5111 Encounter for antineoplastic chemotherapy: Secondary | ICD-10-CM | POA: Diagnosis not present

## 2022-06-03 DIAGNOSIS — C049 Malignant neoplasm of floor of mouth, unspecified: Secondary | ICD-10-CM | POA: Diagnosis not present

## 2022-06-03 DIAGNOSIS — C023 Malignant neoplasm of anterior two-thirds of tongue, part unspecified: Secondary | ICD-10-CM

## 2022-06-03 LAB — COMPREHENSIVE METABOLIC PANEL
ALT: 14 U/L (ref 0–44)
AST: 21 U/L (ref 15–41)
Albumin: 4.1 g/dL (ref 3.5–5.0)
Alkaline Phosphatase: 55 U/L (ref 38–126)
Anion gap: 7 (ref 5–15)
BUN: 13 mg/dL (ref 6–20)
CO2: 26 mmol/L (ref 22–32)
Calcium: 9.1 mg/dL (ref 8.9–10.3)
Chloride: 102 mmol/L (ref 98–111)
Creatinine, Ser: 1.02 mg/dL (ref 0.61–1.24)
GFR, Estimated: >60 mL/min (ref >60)
Glucose, Bld: 101 mg/dL — ABNORMAL HIGH (ref 70–99)
Potassium: 4 mmol/L (ref 3.5–5.1)
Sodium: 135 mmol/L (ref 135–145)
Total Bilirubin: 0.3 mg/dL (ref 0.3–1.2)
Total Protein: 7.1 g/dL (ref 6.5–8.1)

## 2022-06-03 LAB — CBC WITH DIFFERENTIAL/PLATELET
Abs Immature Granulocytes: 0.01 10*3/uL (ref 0.00–0.07)
Basophils Absolute: 0 10*3/uL (ref 0.0–0.1)
Basophils Relative: 1 %
Eosinophils Absolute: 0 10*3/uL (ref 0.0–0.5)
Eosinophils Relative: 1 %
HCT: 30.5 % — ABNORMAL LOW (ref 39.0–52.0)
Hemoglobin: 10.7 g/dL — ABNORMAL LOW (ref 13.0–17.0)
Immature Granulocytes: 0 %
Lymphocytes Relative: 30 %
Lymphs Abs: 1 10*3/uL (ref 0.7–4.0)
MCH: 33.9 pg (ref 26.0–34.0)
MCHC: 35.1 g/dL (ref 30.0–36.0)
MCV: 96.5 fL (ref 80.0–100.0)
Monocytes Absolute: 0.7 10*3/uL (ref 0.1–1.0)
Monocytes Relative: 21 %
Neutro Abs: 1.6 10*3/uL — ABNORMAL LOW (ref 1.7–7.7)
Neutrophils Relative %: 47 %
Platelets: 148 10*3/uL — ABNORMAL LOW (ref 150–400)
RBC: 3.16 MIL/uL — ABNORMAL LOW (ref 4.22–5.81)
RDW: 13.8 % (ref 11.5–15.5)
WBC: 3.4 10*3/uL — ABNORMAL LOW (ref 4.0–10.5)
nRBC: 0 % (ref 0.0–0.2)

## 2022-06-03 MED ORDER — SODIUM CHLORIDE 0.9% FLUSH
10.0000 mL | INTRAVENOUS | Status: DC | PRN
  Administered 2022-06-03: 10 mL via INTRAVENOUS

## 2022-06-03 MED ORDER — LIDOCAINE VISCOUS HCL 2 % MT SOLN
OROMUCOSAL | 4 refills | Status: DC
  Filled 2022-06-03: qty 100, 5d supply, fill #0
  Filled 2022-06-11: qty 100, 3d supply, fill #1
  Filled 2022-06-16: qty 100, 3d supply, fill #2
  Filled 2022-07-01: qty 100, 3d supply, fill #3
  Filled 2022-11-10 – 2022-11-18 (×3): qty 100, 3d supply, fill #4

## 2022-06-03 MED ORDER — DEXAMETHASONE 1 MG PO TABS
ORAL_TABLET | ORAL | 1 refills | Status: DC
  Filled 2022-06-03: qty 15, 30d supply, fill #0

## 2022-06-03 MED ORDER — HEPARIN SOD (PORK) LOCK FLUSH 100 UNIT/ML IV SOLN
500.0000 [IU] | Freq: Once | INTRAVENOUS | Status: AC
  Administered 2022-06-03: 500 [IU] via INTRAVENOUS

## 2022-06-03 NOTE — Progress Notes (Signed)
[facility] up:    Casey Stain, MD 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 CT maxillofacial on 12/22/19 showed postsurgical changes and mandibular invasion, with cervical lymphadenopathy consistent with metastatic nodes. PET scan on 02/16/20 showed the tongue mass with lymph node metastases on the right floor of the mouth and cervical lymph nodes.   03/07/2020 Cancer Staging   Staging form: Oral Cavity, AJCC 8th Edition - Clinical: Stage IVA (cT4a, cN2b, cM0) - Signed by Nicholas Lose, MD on 03/07/2020   04/04/2020 - 05/23/2020 Radiation Therapy    Site Technique Total Dose (Gy) Dose per Fx (Gy) Completed Fx Beam Energies  Tongue: [id number] IMRT 70/70 2 35/35 6X    04/06/2020 - 05/17/2020 Chemotherapy   Patient is on Treatment Plan : HEAD/NECK Cisplatin + XRT q21d     02/06/2022 Imaging   IMPRESSION: 1. Progressive metastatic disease to the lungs as evidenced by increased size of numerous pre-existing pulmonary nodules, as above. No new pulmonary nodules are otherwise noted. No extra pulmonary metastatic disease noted elsewhere in the abdomen or pelvis. 2. Aortic atherosclerosis, in addition to left main and three-vessel coronary artery disease. Please note that although the presence of coronary artery calcium documents the presence of  coronary artery disease, the severity of this disease and any potential stenosis cannot be assessed on this non-gated CT examination. Assessment for potential risk factor modification, dietary therapy or pharmacologic therapy may be warranted, if clinically indicated. 3. Mild diffuse bronchial wall thickening with mild centrilobular and paraseptal emphysema; imaging findings suggestive of underlying COPD.     Electronically Signed   By: Vinnie Langton M.D.   On: 02/06/2022 08:46   03/31/2022 Initial Biopsy   Bronchoscopy LUNG, LUL, NEEDLE  BIOPSIES:  - Malignant cells consistent with squamous cell carcinoma   LUNG, LUL, BRUSHING:  - Malignant cells consistent with squamous cell carcinoma  PDL 0%, negative   Malignant neoplasm metastatic to lung ([place])  10/16/2021 Initial Diagnosis   Malignant neoplasm metastatic to lung ([place])   02/06/2022 Imaging   IMPRESSION: 1. Progressive metastatic disease to the lungs as evidenced by increased size of numerous pre-existing pulmonary nodules, as above. No new pulmonary nodules are otherwise noted. No extra pulmonary metastatic disease noted elsewhere in the abdomen or pelvis. 2. Aortic atherosclerosis, in addition to left main and three-vessel coronary artery disease. Please note that although the presence of coronary artery calcium documents the presence of coronary artery disease, the severity of this disease and any potential stenosis cannot be assessed on this non-gated CT examination. Assessment for potential risk factor modification, dietary therapy or pharmacologic therapy may be warranted, if clinically indicated. 3. Mild diffuse bronchial wall thickening with mild centrilobular and paraseptal emphysema; imaging findings suggestive of underlying COPD.     Electronically Signed   By: Vinnie Langton M.D.   On: 02/06/2022 08:46  03/31/2022 Initial Biopsy   Bronchoscopy LUNG, LUL, NEEDLE  BIOPSIES:  - Malignant cells  consistent with squamous cell carcinoma   LUNG, LUL, BRUSHING:  - Malignant cells consistent with squamous cell carcinoma  PDL 0%, negative   05/19/2022 -  Chemotherapy   Patient is on Treatment Plan : HEAD/NECK Pembrolizumab (200) D1 + Carboplatin (5) D1 + 5FU (1000) IVCI D1-4 q21d x 6 cycles / Pembrolizumab (200) D1 q21d       CURRENT THERAPY: Status postchemotherapy  INTERVAL HISTORY: Casey [name] 56 y.o. male returns for follow-up and evaluation after receiving chemotherapy 2 weeks ago and coming in 1 week later with substantial mucositis.  He was prescribed Valtrex fluconazole Magic mouthwash and oral lidocaine and has since improved.  He had his fluconazole refilled and is eating better as he is up 4 pounds.  He tells me he still does not want to receive chemotherapy.     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  MEDICAL HISTORY: Past Medical History:  Diagnosis Date   Arthritis    Depression    Hypothyroidism    squamous cell ca of floor of mouth 12/2018    SURGICAL HISTORY: Past Surgical History:  Procedure Laterality Date   BRONCHIAL BIOPSY  03/31/2022   Procedure: BRONCHIAL BIOPSIES;  Surgeon: Collene Gobble, MD;  Location: [facility] ENDOSCOPY;  Service: Pulmonary;;   BRONCHIAL BRUSHINGS  03/31/2022   Procedure: BRONCHIAL BRUSHINGS;  Surgeon: Collene Gobble, MD;  Location[facility] ENDOSCOPY;  Service: Pulmonary;;   BRONCHIAL NEEDLE ASPIRATION BIOPSY  03/31/2022   Procedure: BRONCHIAL NEEDLE ASPIRATION  BIOPSIES;  Surgeon: Collene Gobble, MD;  Location: [facility] ENDOSCOPY;  Service: Pulmonary;;   EXCISION OF TONGUE LESION Left 09/17/2020   Procedure: Biopsy of Anterior Tongue;  Surgeon: [name], MD;  Location: [facility];  Service: ENT;  Laterality: Left;   IR GASTROSTOMY TUBE MOD SED  04/03/2020   IR GASTROSTOMY TUBE REMOVAL  10/03/2020   IR IMAGING GUIDED PORT INSERTION  04/03/2020   IR IMAGING GUIDED PORT INSERTION  05/01/2022   IR REMOVAL TUN ACCESS W/ PORT W/O FL MOD SED  10/03/2020   VIDEO BRONCHOSCOPY WITH RADIAL ENDOBRONCHIAL ULTRASOUND  03/31/2022   Procedure: VIDEO BRONCHOSCOPY WITH RADIAL ENDOBRONCHIAL ULTRASOUND;  Surgeon: Collene Gobble, MD;  Location: [facility] ENDOSCOPY;  Service: Pulmonary;;    SOCIAL HISTORY: Social History   Socioeconomic History   Marital status: Single    Spouse name: Not on file   Number of children: Not on file   Years of education: Not on file   Highest education level: Not on file  Occupational History   Not on file  Tobacco Use   Smoking status: Every Day    Packs/day: 0.50    Types: Cigarettes   Smokeless tobacco: Never   Tobacco comments:    32 cigarettes smoked daily 04/17/22 [facility]   Vaping Use   Vaping Use: Never used  Substance and Sexual Activity   Alcohol use: Not Currently    Comment: a 12 pack of beer  a week   Drug use: Never   Sexual activity: Not Currently  Other Topics Concern   Not on file  Social History Narrative   Not on file   Social Determinants of Health   Financial Resource Strain: Not on file  Food Insecurity: Not on file  Transportation Needs: Unmet Transportation Needs (01/29/2022)   PRAPARE - Hydrologist (Medical): Not on file    Lack of Transportation (Non-Medical): Yes  Physical Activity: Not on file  Stress: Not on file  Social Connections: Not on file  Intimate Partner Violence: Not on file    FAMILY HISTORY: No family history on file.  Review of Systems   Constitutional:  Negative for appetite change, chills, fatigue, fever and unexpected weight change.  HENT:   Positive for mouth sores and sore throat. Negative for hearing loss, lump/mass and trouble swallowing.   Eyes:  Negative for eye problems and icterus.  Respiratory:  Negative for chest tightness, cough and shortness of breath.   Cardiovascular:  Negative for chest pain, leg swelling and palpitations.  Gastrointestinal:  Negative for abdominal distention, abdominal pain, constipation, diarrhea, nausea and vomiting.  Endocrine: Negative for hot flashes.  Genitourinary:  Negative for difficulty urinating.   Musculoskeletal:  Negative for arthralgias.  Skin:  Negative for itching and rash.  Neurological:  Negative for dizziness, extremity weakness, headaches and numbness.  Hematological:  Negative for adenopathy. Does not bruise/bleed easily.  Psychiatric/Behavioral:  Negative for depression. The patient is not nervous/anxious.       PHYSICAL EXAMINATION  ECOG PERFORMANCE STATUS: 1 - Symptomatic but completely ambulatory  Vitals:   06/03/22 1326  BP: (!) 140/71  Pulse: 78  Resp: 16  Temp: 97.9 F (36.6 C)  SpO2: 100%    Physical Exam Constitutional:      General: He is not in acute distress.    Appearance: Normal appearance. He is not toxic-appearing.  HENT:     Head: Normocephalic and atraumatic.     Mouth/Throat:     Mouth: Mucous membranes are moist.     Pharynx: Posterior oropharyngeal erythema (mild) present. No oropharyngeal exudate.     Comments: Tumor present under tongue, but otherwise oral cavity much improved from last week Eyes:     General: No scleral icterus. Cardiovascular:     Rate and Rhythm: Normal rate and regular rhythm.     Pulses: Normal pulses.     Heart sounds: Normal heart sounds.  Pulmonary:     Effort: Pulmonary effort is normal.     Breath sounds: Normal breath sounds.  Abdominal:     General: Abdomen is flat. Bowel sounds are normal.  There is no distension.     Palpations: Abdomen is soft.     Tenderness: There is no abdominal tenderness.  Musculoskeletal:        General: No swelling.     Cervical back: Neck supple.  Lymphadenopathy:     Cervical: No cervical adenopathy.  Skin:    General: Skin is warm and dry.     Findings: No rash.  Neurological:     General: No focal deficit present.     Mental Status: He is alert.  Psychiatric:        Mood and Affect: Mood normal.        Behavior: Behavior normal.     LABORATORY DATA:  CBC    Component Value Date/Time   WBC 3.4 (L) 06/03/2022 1316   RBC 3.16 (L)  06/03/2022 1316   HGB 10.7 (L) 06/03/2022 1316   HGB 12.8 (L) 05/05/2022 1143   HGB 13.0 09/17/2021 1511   HCT 30.5 (L) 06/03/2022 1316   HCT 37.2 (L) 09/17/2021 1511   PLT 148 (L) 06/03/2022 1316   PLT 284 05/05/2022 1143   PLT 266 09/17/2021 1511   MCV 96.5 06/03/2022 1316   MCV 100 (H) 09/17/2021 1511   MCH 33.9 06/03/2022 1316   MCHC 35.1 06/03/2022 1316   RDW 13.8 06/03/2022 1316   RDW 13.3 09/17/2021 1511   LYMPHSABS 1.0 06/03/2022 1316   LYMPHSABS 1.3 09/17/2021 1511   MONOABS 0.7 06/03/2022 1316   EOSABS 0.0 06/03/2022 1316   EOSABS 0.2 09/17/2021 1511   BASOSABS 0.0 06/03/2022 1316   BASOSABS 0.1 09/17/2021 1511    CMP     Component Value Date/Time   NA 135 06/03/2022 1316   NA 136 09/17/2021 1511   K 4.0 06/03/2022 1316   CL 102 06/03/2022 1316   CO2 26 06/03/2022 1316   GLUCOSE 101 (H) 06/03/2022 1316   BUN 13 06/03/2022 1316   BUN 9 09/17/2021 1511   CREATININE 1.02 06/03/2022 1316   CREATININE 0.96 05/05/2022 1143   CALCIUM 9.1 06/03/2022 1316   PROT 7.1 06/03/2022 1316   PROT 7.5 09/17/2021 1511   ALBUMIN 4.1 06/03/2022 1316   ALBUMIN 5.0 (H) 09/17/2021 1511   AST 21 06/03/2022 1316   AST 24 05/05/2022 1143   ALT 14 06/03/2022 1316   ALT 20 05/05/2022 1143   ALKPHOS 55 06/03/2022 1316   BILITOT 0.3 06/03/2022 1316   BILITOT 0.5 05/05/2022 1143   GFRNONAA >60  06/03/2022 1316   GFRNONAA >60 05/05/2022 1143   GFRAA >60 05/03/2020 0940         ASSESSMENT and THERAPY PLAN:   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  His oral mucositis is much improved.  He will continue on Diflucan.  I refilled his dexamethasone where he will take 1 mg every other day to help with mouth swelling.  I also sent in a different prescription for his oral lidocaine which will be 100 mL fill instead of 200 mL fill so insurance will cover it.  Casey [name] will return on November 6 for labs, and follow-up with Dr. Chryl Heck to discuss next steps for his care.   All questions were answered. The patient knows to call the clinic with any problems, questions or concerns. We can certainly see the patient much sooner if necessary.  Total encounter time:20 minutes*in face-to-face visit time, chart review, lab review, care coordination, order entry, and documentation of the encounter time.    Casey Bihari, NP 06/03/22 4:03 PM Medical Oncology and Hematology [facility] [place], [place] [address] Tel. [phone]    Fax. [phone]  *Total Encounter Time as defined by the Centers for Medicare and Medicaid Services includes, in addition to the face-to-face time of a patient visit (documented in the note above) non-face-to-face time: obtaining and reviewing outside history, ordering and reviewing medications, tests or procedures, care coordination (communications with other health care professionals or caregivers) and documentation in the medical record.

## 2022-06-03 NOTE — Assessment & Plan Note (Signed)
Casey [name] is here today for follow-up of his stage IVa squamous cell carcinoma.  His oral mucositis is much improved.  He will continue on Diflucan.  I refilled his dexamethasone where he will take 1 mg every other day to help with mouth swelling.  I also sent in a different prescription for his oral lidocaine which will be 100 mL fill instead of 200 mL fill so insurance will cover it.  Casey [name] will return on November 6 for labs, and follow-up with Dr. Chryl Heck to discuss next steps for his care.

## 2022-06-06 MED FILL — Fosaprepitant Dimeglumine For IV Infusion 150 MG (Base Eq): INTRAVENOUS | Qty: 5 | Status: AC

## 2022-06-06 MED FILL — Dexamethasone Sodium Phosphate Inj 100 MG/10ML: INTRAMUSCULAR | Qty: 1 | Status: AC

## 2022-06-09 ENCOUNTER — Encounter: Payer: Self-pay | Admitting: Hematology and Oncology

## 2022-06-09 ENCOUNTER — Inpatient Hospital Stay: Payer: Medicaid Other

## 2022-06-09 ENCOUNTER — Ambulatory Visit: Payer: Medicaid Other

## 2022-06-09 ENCOUNTER — Other Ambulatory Visit (HOSPITAL_COMMUNITY): Payer: Self-pay

## 2022-06-09 ENCOUNTER — Inpatient Hospital Stay: Payer: Medicaid Other | Attending: Hematology and Oncology | Admitting: Hematology and Oncology

## 2022-06-09 DIAGNOSIS — C7802 Secondary malignant neoplasm of left lung: Secondary | ICD-10-CM | POA: Diagnosis not present

## 2022-06-09 DIAGNOSIS — C7801 Secondary malignant neoplasm of right lung: Secondary | ICD-10-CM

## 2022-06-09 DIAGNOSIS — C7800 Secondary malignant neoplasm of unspecified lung: Secondary | ICD-10-CM | POA: Diagnosis not present

## 2022-06-09 DIAGNOSIS — Z79899 Other long term (current) drug therapy: Secondary | ICD-10-CM | POA: Insufficient documentation

## 2022-06-09 DIAGNOSIS — Z452 Encounter for adjustment and management of vascular access device: Secondary | ICD-10-CM | POA: Insufficient documentation

## 2022-06-09 DIAGNOSIS — C049 Malignant neoplasm of floor of mouth, unspecified: Secondary | ICD-10-CM

## 2022-06-09 LAB — TSH: TSH: 18.36 u[IU]/mL — ABNORMAL HIGH (ref 0.350–4.500)

## 2022-06-09 LAB — CMP (CANCER CENTER ONLY)
ALT: 15 U/L (ref 0–44)
AST: 23 U/L (ref 15–41)
Albumin: 4.1 g/dL (ref 3.5–5.0)
Alkaline Phosphatase: 53 U/L (ref 38–126)
Anion gap: 7 (ref 5–15)
BUN: 7 mg/dL (ref 6–20)
CO2: 27 mmol/L (ref 22–32)
Calcium: 8.9 mg/dL (ref 8.9–10.3)
Chloride: 100 mmol/L (ref 98–111)
Creatinine: 0.76 mg/dL (ref 0.61–1.24)
GFR, Estimated: >60 mL/min (ref >60)
Glucose, Bld: 83 mg/dL (ref 70–99)
Potassium: 4 mmol/L (ref 3.5–5.1)
Sodium: 134 mmol/L — ABNORMAL LOW (ref 135–145)
Total Bilirubin: 0.3 mg/dL (ref 0.3–1.2)
Total Protein: 6.9 g/dL (ref 6.5–8.1)

## 2022-06-09 LAB — CBC WITH DIFFERENTIAL (CANCER CENTER ONLY)
Abs Immature Granulocytes: 0 10*3/uL (ref 0.00–0.07)
Basophils Absolute: 0 10*3/uL (ref 0.0–0.1)
Basophils Relative: 1 %
Eosinophils Absolute: 0.1 10*3/uL (ref 0.0–0.5)
Eosinophils Relative: 3 %
HCT: 30 % — ABNORMAL LOW (ref 39.0–52.0)
Hemoglobin: 10.6 g/dL — ABNORMAL LOW (ref 13.0–17.0)
Immature Granulocytes: 0 %
Lymphocytes Relative: 31 %
Lymphs Abs: 1 10*3/uL (ref 0.7–4.0)
MCH: 34.6 pg — ABNORMAL HIGH (ref 26.0–34.0)
MCHC: 35.3 g/dL (ref 30.0–36.0)
MCV: 98 fL (ref 80.0–100.0)
Monocytes Absolute: 0.5 10*3/uL (ref 0.1–1.0)
Monocytes Relative: 15 %
Neutro Abs: 1.6 10*3/uL — ABNORMAL LOW (ref 1.7–7.7)
Neutrophils Relative %: 50 %
Platelet Count: 140 10*3/uL — ABNORMAL LOW (ref 150–400)
RBC: 3.06 MIL/uL — ABNORMAL LOW (ref 4.22–5.81)
RDW: 15.2 % (ref 11.5–15.5)
WBC Count: 3.2 10*3/uL — ABNORMAL LOW (ref 4.0–10.5)
nRBC: 0 % (ref 0.0–0.2)

## 2022-06-09 LAB — T4, FREE: Free T4: 0.48 ng/dL — ABNORMAL LOW (ref 0.61–1.12)

## 2022-06-09 MED ORDER — ALTEPLASE 2 MG IJ SOLR: 2.0000 mg | Freq: Once | INTRAMUSCULAR | Status: DC | PRN

## 2022-06-09 MED ORDER — HEPARIN SOD (PORK) LOCK FLUSH 100 UNIT/ML IV SOLN
500.0000 [IU] | Freq: Once | INTRAVENOUS | Status: AC | PRN
  Administered 2022-06-09: 500 [IU]

## 2022-06-09 MED ORDER — SODIUM CHLORIDE 0.9% FLUSH: 3.0000 mL | Freq: Once | INTRAVENOUS | Status: DC | PRN

## 2022-06-09 MED ORDER — HEPARIN SOD (PORK) LOCK FLUSH 100 UNIT/ML IV SOLN: 250.0000 [IU] | Freq: Once | INTRAVENOUS | Status: DC | PRN

## 2022-06-09 MED ORDER — SODIUM CHLORIDE 0.9% FLUSH
10.0000 mL | Freq: Once | INTRAVENOUS | Status: AC | PRN
  Administered 2022-06-09: 10 mL

## 2022-06-09 NOTE — Progress Notes (Signed)
[facility] Follow up:    Casey Stain, MD 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 CT maxillofacial on 12/22/19 showed postsurgical changes and mandibular invasion, with cervical lymphadenopathy consistent with metastatic nodes. PET scan on 02/16/20 showed the tongue mass with lymph node metastases on the right floor of the mouth and cervical lymph nodes.   03/07/2020 Cancer Staging   Staging form: Oral Cavity, AJCC 8th Edition - Clinical: Stage IVA (cT4a, cN2b, cM0) - Signed by Nicholas Lose, MD on 03/07/2020   04/04/2020 - 05/23/2020 Radiation Therapy    Site Technique Total Dose (Gy) Dose per Fx (Gy) Completed Fx Beam Energies  Tongue: HN_tongFOM IMRT 70/70 2 35/35 6X    04/06/2020 - 05/17/2020 Chemotherapy   Patient is on Treatment Plan : HEAD/NECK Cisplatin + XRT q21d     02/06/2022 Imaging   IMPRESSION: 1. Progressive metastatic disease to the lungs as evidenced by increased size of numerous pre-existing pulmonary nodules, as above. No new pulmonary nodules are otherwise noted. No extra pulmonary metastatic disease noted elsewhere in the abdomen or pelvis. 2. Aortic atherosclerosis, in addition to left main and three-vessel coronary artery disease. Please note that although the presence of coronary artery calcium documents the presence of  coronary artery disease, the severity of this disease and any potential stenosis cannot be assessed on this non-gated CT examination. Assessment for potential risk factor modification, dietary therapy or pharmacologic therapy may be warranted, if clinically indicated. 3. Mild diffuse bronchial wall thickening with mild centrilobular and paraseptal emphysema; imaging findings suggestive of underlying COPD.     Electronically Signed   By: Vinnie Langton M.D.   On: 02/06/2022 08:46   03/31/2022 Initial Biopsy   Bronchoscopy LUNG, LUL, NEEDLE  BIOPSIES:  - Malignant cells consistent with squamous cell carcinoma   LUNG, LUL, BRUSHING:  - Malignant cells consistent with squamous cell carcinoma  PDL 0%, negative   Malignant neoplasm metastatic to lung ([place])  10/16/2021 Initial Diagnosis   Malignant neoplasm metastatic to lung ([place])   02/06/2022 Imaging   IMPRESSION: 1. Progressive metastatic disease to the lungs as evidenced by increased size of numerous pre-existing pulmonary nodules, as above. No new pulmonary nodules are otherwise noted. No extra pulmonary metastatic disease noted elsewhere in the abdomen or pelvis. 2. Aortic atherosclerosis, in addition to left main and three-vessel coronary artery disease. Please note that although the presence of coronary artery calcium documents the presence of coronary artery disease, the severity of this disease and any potential stenosis cannot be assessed on this non-gated CT examination. Assessment for potential risk factor modification, dietary therapy or pharmacologic therapy may be warranted, if clinically indicated. 3. Mild diffuse bronchial wall thickening with mild centrilobular and paraseptal emphysema; imaging findings suggestive of underlying COPD.     Electronically Signed   By: Vinnie Langton M.D.   On: 02/06/2022 08:46  03/31/2022 Initial Biopsy   Bronchoscopy LUNG, LUL, NEEDLE  BIOPSIES:  - Malignant cells  consistent with squamous cell carcinoma   LUNG, LUL, BRUSHING:  - Malignant cells consistent with squamous cell carcinoma  PDL 0%, negative   05/19/2022 -  Chemotherapy   Patient is on Treatment Plan : HEAD/NECK Pembrolizumab (200) D1 + Carboplatin (5) D1 + 5FU (1000) IVCI D1-4 q21d x 6 cycles / Pembrolizumab (200) D1 q21d       CURRENT THERAPY: Status postchemotherapy  INTERVAL HISTORY:  Casey [name] 56 y.o. male returns for follow-up . Since last chemotherapy, he had severe mucositis, hence elected to not move forward with chemotherapy. He says the ulcers were horrible. He doesn't want to do any more chemo. He however wants to continue periodic scan.  At baseline he has some issues with mild swelling even prior to chemo but his mucositis/mild pain worsened after chemotherapy.  He has been using Diflucan, viscous lidocaine and as needed dexamethasone for mild swelling.  He tells me yesterday morning when he woke up he had about the first 3 hours of the day with mild swelling but it slowly eased.  He once again is reluctant to do any more treatment but understands that the cancer will continue to progress without any treatment.  He once again is not quite sure if he will not consider any chemo in the future.  He continues to drink about 2 beers a day he says. No change in the alcohol intake. Rest of the pertinent 10 point ROS reviewed and negative.  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  MEDICAL HISTORY: Past  Medical History:  Diagnosis Date   Arthritis    Depression    Hypothyroidism    squamous cell ca of floor of mouth 12/2018    SURGICAL HISTORY: Past Surgical History:  Procedure Laterality Date   BRONCHIAL BIOPSY  03/31/2022   Procedure: BRONCHIAL BIOPSIES;  Surgeon: Collene Gobble, MD;  Location[facility] ENDOSCOPY;  Service: Pulmonary;;   BRONCHIAL BRUSHINGS  03/31/2022   Procedure: BRONCHIAL BRUSHINGS;  Surgeon: Collene Gobble, MD;  Location: [facility] ENDOSCOPY;  Service: Pulmonary;;   BRONCHIAL NEEDLE ASPIRATION BIOPSY  03/31/2022   Procedure: BRONCHIAL NEEDLE ASPIRATION BIOPSIES;  Surgeon: Collene Gobble, MD;  Location: [facility] ENDOSCOPY;  Service: Pulmonary;;   EXCISION OF TONGUE LESION Left 09/17/2020   Procedure: Biopsy of Anterior Tongue;  Surgeon: [name], MD;  Location: [facility];  Service: ENT;  Laterality: Left;   IR GASTROSTOMY TUBE MOD SED  04/03/2020   IR GASTROSTOMY TUBE REMOVAL  10/03/2020   IR IMAGING GUIDED PORT INSERTION  04/03/2020   IR IMAGING GUIDED PORT INSERTION  05/01/2022   IR REMOVAL TUN ACCESS W/ PORT W/O FL MOD SED  10/03/2020   VIDEO BRONCHOSCOPY WITH RADIAL ENDOBRONCHIAL ULTRASOUND  03/31/2022   Procedure: VIDEO BRONCHOSCOPY WITH RADIAL ENDOBRONCHIAL ULTRASOUND;  Surgeon: Collene Gobble, MD;  Location: [facility] ENDOSCOPY;  Service: Pulmonary;;    SOCIAL HISTORY: Social History   Socioeconomic History   Marital status: Single  Spouse name: Not on file   Number of children: Not on file   Years of education: Not on file   Highest education level: Not on file  Occupational History   Not on file  Tobacco Use   Smoking status: Every Day    Packs/day: 0.50    Types: Cigarettes   Smokeless tobacco: Never   Tobacco comments:    32 cigarettes smoked daily 04/17/22 [facility]   Vaping Use   Vaping Use: Never used  Substance and Sexual Activity   Alcohol use: Not Currently    Comment: a 12 pack of beer a week   Drug use: Never   Sexual activity: Not Currently   Other Topics Concern   Not on file  Social History Narrative   Not on file   Social Determinants of Health   Financial Resource Strain: Not on file  Food Insecurity: Not on file  Transportation Needs: Unmet Transportation Needs (01/29/2022)   PRAPARE - Hydrologist (Medical): Not on file    Lack of Transportation (Non-Medical): Yes  Physical Activity: Not on file  Stress: Not on file  Social Connections: Not on file  Intimate Partner Violence: Not on file    FAMILY HISTORY: No family history on file.  Review of Systems  Constitutional:  Negative for appetite change, chills, fatigue, fever and unexpected weight change.  HENT:   Positive for mouth sores and sore throat. Negative for hearing loss, lump/mass and trouble swallowing.   Eyes:  Negative for eye problems and icterus.  Respiratory:  Negative for chest tightness, cough and shortness of breath.   Cardiovascular:  Negative for chest pain, leg swelling and palpitations.  Gastrointestinal:  Negative for abdominal distention, abdominal pain, constipation, diarrhea, nausea and vomiting.  Endocrine: Negative for hot flashes.  Genitourinary:  Negative for difficulty urinating.   Musculoskeletal:  Negative for arthralgias.  Skin:  Negative for itching and rash.  Neurological:  Negative for dizziness, extremity weakness, headaches and numbness.  Hematological:  Negative for adenopathy. Does not bruise/bleed easily.  Psychiatric/Behavioral:  Negative for depression. The patient is not nervous/anxious.       PHYSICAL EXAMINATION  ECOG PERFORMANCE STATUS: 1 - Symptomatic but completely ambulatory  Vitals:   06/09/22 1218  BP: 133/84  Pulse: 72  Resp: 17  Temp: (!) 97.5 F (36.4 C)  SpO2: 100%    Physical Exam Constitutional:      General: He is not in acute distress.    Appearance: Normal appearance. He is not toxic-appearing.  HENT:     Head: Normocephalic and atraumatic.      Mouth/Throat:     Mouth: Mucous membranes are moist.     Pharynx: Posterior oropharyngeal erythema (mild) present. No oropharyngeal exudate.     Comments: Faint odor of ETOH in the room, patient denies any excessive drinking Eyes:     General: No scleral icterus. Cardiovascular:     Rate and Rhythm: Normal rate and regular rhythm.     Pulses: Normal pulses.     Heart sounds: Normal heart sounds.  Pulmonary:     Effort: Pulmonary effort is normal.     Breath sounds: Normal breath sounds.  Abdominal:     General: Abdomen is flat. Bowel sounds are normal. There is no distension.     Palpations: Abdomen is soft.     Tenderness: There is no abdominal tenderness.  Musculoskeletal:        General: No swelling.  Cervical back: Neck supple.  Lymphadenopathy:     Cervical: No cervical adenopathy.  Skin:    General: Skin is warm and dry.     Findings: No rash.  Neurological:     General: No focal deficit present.     Mental Status: He is alert.  Psychiatric:        Mood and Affect: Mood normal.        Behavior: Behavior normal.     LABORATORY DATA:  CBC    Component Value Date/Time   WBC 3.2 (L) 06/09/2022 1159   WBC 3.4 (L) 06/03/2022 1316   RBC 3.06 (L) 06/09/2022 1159   HGB 10.6 (L) 06/09/2022 1159   HGB 13.0 09/17/2021 1511   HCT 30.0 (L) 06/09/2022 1159   HCT 37.2 (L) 09/17/2021 1511   PLT 140 (L) 06/09/2022 1159   PLT 266 09/17/2021 1511   MCV 98.0 06/09/2022 1159   MCV 100 (H) 09/17/2021 1511   MCH 34.6 (H) 06/09/2022 1159   MCHC 35.3 06/09/2022 1159   RDW 15.2 06/09/2022 1159   RDW 13.3 09/17/2021 1511   LYMPHSABS 1.0 06/09/2022 1159   LYMPHSABS 1.3 09/17/2021 1511   MONOABS 0.5 06/09/2022 1159   EOSABS 0.1 06/09/2022 1159   EOSABS 0.2 09/17/2021 1511   BASOSABS 0.0 06/09/2022 1159   BASOSABS 0.1 09/17/2021 1511    CMP     Component Value Date/Time   NA 134 (L) 06/09/2022 1159   NA 136 09/17/2021 1511   K 4.0 06/09/2022 1159   CL 100 06/09/2022  1159   CO2 27 06/09/2022 1159   GLUCOSE 83 06/09/2022 1159   BUN 7 06/09/2022 1159   BUN 9 09/17/2021 1511   CREATININE 0.76 06/09/2022 1159   CALCIUM 8.9 06/09/2022 1159   PROT 6.9 06/09/2022 1159   PROT 7.5 09/17/2021 1511   ALBUMIN 4.1 06/09/2022 1159   ALBUMIN 5.0 (H) 09/17/2021 1511   AST 23 06/09/2022 1159   ALT 15 06/09/2022 1159   ALKPHOS 53 06/09/2022 1159   BILITOT 0.3 06/09/2022 1159   GFRNONAA >60 06/09/2022 1159   GFRAA >60 05/03/2020 0940         ASSESSMENT and THERAPY PLAN:  1. SCC of floor of mouth,  T4AN2B M0 stage IVa clinical stage   PET scan on 02/16/20 showed the tongue mass with lymph node metastases on the right floor of the mouth and cervical lymph nodes. CK7 and CD 117 are negative, p63 and CK 5/6 are positive, p16 is negative Completed 3 Cycles of cisplatin (started 04/06/2020) with XRT   PET CT scan 08/28/2020: Increased tongue hypermetabolism SUV 15.2 more diffuse including posteriorly suggestive of progressive local disease.  Resolution of thoracic nodal hypermetabolism.  No evidence of extrathoracic metastases. Repeat biopsy showed  Focal necroinflammatory debris.  No carcinoma identified.  No  squamous mucosa present for evaluation.    He had CT chest in November 2022 which showed multiple new small pulmonary nodules consistent with pulmonary metastatic disease.  CT soft tissue neck showed satisfactory posttreatment appearance of the neck.   PET/CT done in December 2022 showed FDG avid pulmonary nodules in the upper lobes with enlarging base nodule in the right lung base suspicious for metastatic disease.  [name] met him in December 2022 to discuss the imaging findings discussed about offering SBRT versus biopsy versus referral for systemic therapy.  He elected to monitor.     He then had imaging in March 2023 which showed several enlarging pulmonary nodules  consistent with metastatic disease, patchy groundglass opacities in the left upper lobe  and right lower lobe which are likely inflammatory.   He said he was not ready for a biopsy, he does not want to proceed with any treatment at this time but would rather monitor.  He understands that this could progress to widespread metastatic disease and may become symptomatic and hard to treat at a later date.     He was supposed to come back in 3 months with repeat scans.  He however had the scan scheduled but canceled since he was not feeling well.  He did not reschedule this.    He finally had CT imaging in July 2023 which showed progressive metastatic disease to the lungs, increased size of numerous pre-existing pulmonary nodules.  No extrapulmonary metastatic disease.   He then underwent bronchoscopy and biopsy of the left upper lobe lung nodule which showed squamous cell carcinoma He is here to discuss treatment planning given the metastatic squamous cell carcinoma along with his sister Ms. Ann.  We have today discussed the incurable nature of the disease, intent of treatment which is palliative.  We have discussed the median overall survival with chemoimmunotherapy.  We have discussed about considering carbo/5-FU with [facility] frontline for management of metastatic head and neck cancer.    He says Beryle Flock is off the table. He doesn't want to do the immunotherapy.  We have once again discussed about the benefits and risks of immunotherapy.  He insists that Keytruda be removed from the treatment plan.  He tried 1 cycle of carbo/5-FU and return to clinic for follow-up complaining of severe mouth pain, worse mucositis.  He still reports some intermittent mild swelling although he had some mild swelling issues even prior to chemotherapy hence I do not believe this is entirely related to the recent chemo.  We have once again discussed that without treatment his cancer will continue to progress however he is not willing to pursue treatment at this time.  He would however want to repeat CT scans  every 6 months or so so he can understand the status of the disease.  When asked the question will he be willing for more chemotherapy down the road, he says he is not quite sure, he will think about it.  He can continue as needed lidocaine and has been taking dexamethasone once or twice a week for some mild swelling.  He was strongly encouraged to return to clinic in about 6 months with repeat imaging.  He is okay with this plan.  I also encouraged him to call me with any new questions or concerns.   All questions were answered. The patient knows to call the clinic with any problems, questions or concerns. We can certainly see the patient much sooner if necessary.  Total encounter time:30 minutes*in face-to-face visit time, chart review, lab review, care coordination, order entry, and documentation of the encounter time.    *Total Encounter Time as defined by the Centers for Medicare and Medicaid Services includes, in addition to the face-to-face time of a patient visit (documented in the note above) non-face-to-face time: obtaining and reviewing outside history, ordering and reviewing medications, tests or procedures, care coordination (communications with other health care professionals or caregivers) and documentation in the medical record.

## 2022-06-10 ENCOUNTER — Other Ambulatory Visit: Payer: Self-pay

## 2022-06-10 ENCOUNTER — Ambulatory Visit: Payer: Medicaid Other | Admitting: Critical Care Medicine

## 2022-06-11 ENCOUNTER — Other Ambulatory Visit (HOSPITAL_COMMUNITY): Payer: Self-pay

## 2022-06-11 ENCOUNTER — Ambulatory Visit: Payer: Medicaid Other | Attending: Critical Care Medicine | Admitting: Physician Assistant

## 2022-06-11 VITALS — BP 163/92 | HR 76 | Temp 99.0°F | Ht 63.0 in | Wt 115.0 lb

## 2022-06-11 DIAGNOSIS — C023 Malignant neoplasm of anterior two-thirds of tongue, part unspecified: Secondary | ICD-10-CM | POA: Diagnosis not present

## 2022-06-11 DIAGNOSIS — E039 Hypothyroidism, unspecified: Secondary | ICD-10-CM

## 2022-06-11 DIAGNOSIS — K1379 Other lesions of oral mucosa: Secondary | ICD-10-CM | POA: Diagnosis not present

## 2022-06-11 MED ORDER — LEVOTHYROXINE SODIUM 125 MCG PO TABS
125.0000 ug | ORAL_TABLET | Freq: Every day | ORAL | 3 refills | Status: DC
  Filled 2022-06-11: qty 90, 90d supply, fill #0

## 2022-06-11 NOTE — Progress Notes (Signed)
Patient ID: Casey [name], male   DOB: 07-15-1966, 56 y.o.   MRN: [id number]   Casey [name], is a 56 y.o. male  [id number]:[id number]  [id number]:[id number]  DOB - February 10, 1966  Chief Complaint  Patient presents with   Follow-up    Discuss possible portal removal & lab results.  Pain on tongue, ulcers.         Subjective:   Casey [name] is a 56 y.o. male here today for thyroid management.  Seen by oncologist 2 days ago.  Continues to have mouth pain.  Has been taking 13mcg total synthroid daily(2-69mcg).  No new issues just ongoing issues with mouth and treatments.  Seen by oncology 11/6.     No problems updated.  ALLERGIES: No Known Allergies  PAST MEDICAL HISTORY: Past Medical History:  Diagnosis Date   Arthritis    Depression    Hypothyroidism    squamous cell ca of floor of mouth 12/2018    MEDICATIONS AT HOME: Prior to Admission medications   Medication Sig Start Date End Date Taking? Authorizing Provider  levothyroxine (SYNTHROID) 125 MCG tablet Take 1 tablet (125 mcg total) by mouth daily. 06/11/22  Yes Todrick Siedschlag M, PA-C  buPROPion (WELLBUTRIN XL) 150 MG 24 hr tablet Take 1 tablet by mouth daily. 05/27/22   Gardenia Phlegm, NP  chlorhexidine (PERIDEX) 0.12 % solution Rinse with 4 teaspoonsfull then spit after use. Use once to twice daily. 05/22/22   Collene Gobble, MD  dexamethasone (DECADRON) 1 MG tablet Take no more than 1 tablet every other day, as needed for mouth swelling. 06/03/22   Causey, Charlestine Massed, NP  dicyclomine (BENTYL) 10 MG capsule Take 1 capsule (10 mg total) by mouth 3 (three) times daily as needed for spasms. 09/17/21   Elsie Stain, MD  fluconazole (DIFLUCAN) 200 MG tablet Take 1 tablet (200 mg total) by mouth daily. 05/27/22   Causey, Charlestine Massed, NP  lidocaine (XYLOCAINE) 2 % solution MIX 1 PART 2% VISCOUS LIDOCAINE WITH 1 PART WATER. SWISH AND SPIT 10ML OF DILUTED MIXTURE TO NUMB MOUTH, UP TO 6 TIMES DAILY AS  NEEDED 06/03/22 06/03/23  Gardenia Phlegm, NP  lidocaine-prilocaine (EMLA) cream Apply 1 Application topically as needed. 05/19/22   Benay Pike, MD  magic mouthwash (lidocaine, diphenhydrAMINE, alum & mag hydroxide) suspension Swish and spit 5 mls every 4 hours as needed 05/26/22   Benay Pike, MD  Multiple Vitamin (MULTIVITAMIN) tablet Take 1 tablet by mouth daily.    [provider]  ondansetron (ZOFRAN) 4 MG tablet Take 1 tablet (4 mg total) by mouth every 8 (eight) hours as needed for nausea or vomiting. 05/27/22   Causey, Charlestine Massed, NP  valACYclovir (VALTREX) 1000 MG tablet Take 1 tablet (1,000 mg total) by mouth 2 (two) times daily. 05/27/22   Causey, Charlestine Massed, NP    ROS: Neg resp Neg cardiac Neg GI Neg GU Neg MS Neg psych Neg neuro  Objective:   Vitals:   06/11/22 1338  BP: (!) 163/92  Pulse: 76  Temp: 99 F (37.2 C)  TempSrc: Oral  Weight: 115 lb (52.2 kg)  Height: 5\' 3"  (1.6 m)   Exam General appearance : Awake, alert, not in any distress. Speech Clear. Not toxic looking HEENT: Atraumatic and Normocephalic; tongue abnormal with some apthous ulcers of gums Neck: Supple, no JVD. No cervical lymphadenopathy.  Chest: Good air entry bilaterally, CTAB.  No rales/rhonchi/wheezing CVS: S1 S2 regular, no murmurs.  Extremities: B/L Lower Ext shows  no edema, both legs are warm to touch Neurology: Awake alert, and oriented X 3, CN II-XII intact, Non focal Skin: No Rash  Data Review No results found for: "HGBA1C"  Assessment & Plan   1. Hypothyroidism (acquired) Stop the 158mcg-increase to 134mcg. - levothyroxine (SYNTHROID) 125 MCG tablet; Take 1 tablet (125 mcg total) by mouth daily.  Dispense: 90 tablet; Refill: 3  2. Mouth pain Followed by oncology  3. 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  The  patient was given clear instructions to go to ER or return to medical center if symptoms don't improve, worsen or new problems develop. The patient verbalized understanding. The patient was told to call to get lab results if they haven't heard anything in the next week.      Freeman Caldron, PA-C [facility] and [place], [place]   06/11/2022, 1:57 PM

## 2022-06-12 ENCOUNTER — Other Ambulatory Visit: Payer: Self-pay

## 2022-06-13 ENCOUNTER — Other Ambulatory Visit (HOSPITAL_COMMUNITY): Payer: Self-pay

## 2022-06-13 ENCOUNTER — Encounter: Payer: Self-pay | Admitting: Hematology and Oncology

## 2022-06-13 ENCOUNTER — Encounter: Payer: Self-pay | Admitting: Adult Health

## 2022-06-13 NOTE — Telephone Encounter (Addendum)
-----   Message from Benay Pike, MD sent at 06/13/2022  8:41 AM EST ----- Val  I am not entirely sure if Casey [name] is taking his medicaitons. Can you talk to him, make sure he is compliant and may be re check in 2 weeks.  Thanks,  This RN called pt and discussed above- he states he went to his primary MD who reviewed the results with him and placed him on 125 mcg of levothyroxine.  Per phone discussion he states he would like to cancel the appts and inf for November "I do not plan on doing more chemo"  This RN validated his statement - and informed him appts will be canceled.  Discussed port a cath - note his sister is questioning why he should keep it if he is not going to do treatment- he would like to keep it for drawing of labs and if needed IV access.  This RN informed keeping his port is medically appropriate - with need for routine flushing for maintenance - he is in agreement with this request.  He states he understands to call if he has issues or concerns and needs to be seen prior to Feb 2024.  This RN scheduled pt for flush for Dec and sent scheduling request for port flush post appt in Feb.  No further needs at this time.

## 2022-06-16 ENCOUNTER — Other Ambulatory Visit (HOSPITAL_COMMUNITY): Payer: Self-pay

## 2022-06-16 ENCOUNTER — Other Ambulatory Visit: Payer: Self-pay | Admitting: Adult Health

## 2022-06-16 DIAGNOSIS — C049 Malignant neoplasm of floor of mouth, unspecified: Secondary | ICD-10-CM

## 2022-06-16 MED ORDER — FLUCONAZOLE 200 MG PO TABS
200.0000 mg | ORAL_TABLET | Freq: Every day | ORAL | 2 refills | Status: DC
  Filled 2022-06-16: qty 5, 5d supply, fill #0
  Filled 2022-07-02 – 2022-07-12 (×2): qty 5, 5d supply, fill #1
  Filled 2022-07-22: qty 5, 5d supply, fill #2

## 2022-06-30 ENCOUNTER — Other Ambulatory Visit: Payer: Medicaid Other

## 2022-06-30 ENCOUNTER — Ambulatory Visit: Payer: Medicaid Other | Admitting: Hematology and Oncology

## 2022-06-30 ENCOUNTER — Ambulatory Visit: Payer: Medicaid Other

## 2022-07-01 ENCOUNTER — Encounter: Payer: Self-pay | Admitting: Adult Health

## 2022-07-01 ENCOUNTER — Encounter: Payer: Self-pay | Admitting: Hematology and Oncology

## 2022-07-01 ENCOUNTER — Other Ambulatory Visit (HOSPITAL_COMMUNITY): Payer: Self-pay

## 2022-07-02 ENCOUNTER — Encounter: Payer: Self-pay | Admitting: Adult Health

## 2022-07-02 ENCOUNTER — Other Ambulatory Visit (HOSPITAL_COMMUNITY): Payer: Self-pay

## 2022-07-02 ENCOUNTER — Encounter: Payer: Self-pay | Admitting: Hematology and Oncology

## 2022-07-09 ENCOUNTER — Encounter (HOSPITAL_COMMUNITY): Payer: Self-pay

## 2022-07-09 ENCOUNTER — Other Ambulatory Visit (HOSPITAL_COMMUNITY): Payer: Self-pay

## 2022-07-10 ENCOUNTER — Encounter: Payer: Self-pay | Admitting: Hematology and Oncology

## 2022-07-10 ENCOUNTER — Other Ambulatory Visit (HOSPITAL_COMMUNITY): Payer: Self-pay

## 2022-07-10 ENCOUNTER — Encounter: Payer: Self-pay | Admitting: Adult Health

## 2022-07-12 ENCOUNTER — Other Ambulatory Visit (HOSPITAL_COMMUNITY): Payer: Self-pay

## 2022-07-17 ENCOUNTER — Encounter: Payer: Self-pay | Admitting: Adult Health

## 2022-07-17 ENCOUNTER — Encounter: Payer: Self-pay | Admitting: Hematology and Oncology

## 2022-07-17 ENCOUNTER — Inpatient Hospital Stay: Payer: Medicaid Other | Attending: Hematology and Oncology

## 2022-07-17 DIAGNOSIS — C049 Malignant neoplasm of floor of mouth, unspecified: Secondary | ICD-10-CM | POA: Insufficient documentation

## 2022-07-17 DIAGNOSIS — Z452 Encounter for adjustment and management of vascular access device: Secondary | ICD-10-CM | POA: Diagnosis not present

## 2022-07-17 DIAGNOSIS — Z95828 Presence of other vascular implants and grafts: Secondary | ICD-10-CM

## 2022-07-17 MED ORDER — SODIUM CHLORIDE 0.9% FLUSH
10.0000 mL | INTRAVENOUS | Status: DC | PRN
  Administered 2022-07-17: 10 mL via INTRAVENOUS

## 2022-07-17 MED ORDER — HEPARIN SOD (PORK) LOCK FLUSH 100 UNIT/ML IV SOLN
500.0000 [IU] | Freq: Once | INTRAVENOUS | Status: AC
  Administered 2022-07-17: 500 [IU] via INTRAVENOUS

## 2022-07-22 ENCOUNTER — Other Ambulatory Visit (HOSPITAL_COMMUNITY): Payer: Self-pay

## 2022-07-31 ENCOUNTER — Other Ambulatory Visit (HOSPITAL_COMMUNITY): Payer: Self-pay

## 2022-07-31 ENCOUNTER — Other Ambulatory Visit: Payer: Self-pay | Admitting: Adult Health

## 2022-07-31 ENCOUNTER — Telehealth: Payer: Self-pay

## 2022-07-31 ENCOUNTER — Other Ambulatory Visit: Payer: Self-pay

## 2022-07-31 DIAGNOSIS — C049 Malignant neoplasm of floor of mouth, unspecified: Secondary | ICD-10-CM

## 2022-07-31 MED ORDER — FLUCONAZOLE 200 MG PO TABS
200.0000 mg | ORAL_TABLET | Freq: Every day | ORAL | 2 refills | Status: DC
  Filled 2022-07-31: qty 5, 5d supply, fill #0

## 2022-07-31 NOTE — Telephone Encounter (Signed)
Patient called asking if he needs to keep is PAC in. After discussion, patient will wait to discuss with Dr. Chryl Heck on 09/16/22.

## 2022-08-06 ENCOUNTER — Other Ambulatory Visit: Payer: Self-pay

## 2022-08-07 ENCOUNTER — Other Ambulatory Visit (HOSPITAL_COMMUNITY): Payer: Self-pay

## 2022-08-11 ENCOUNTER — Encounter: Payer: Self-pay | Admitting: Adult Health

## 2022-08-11 ENCOUNTER — Encounter: Payer: Self-pay | Admitting: Hematology and Oncology

## 2022-08-11 ENCOUNTER — Other Ambulatory Visit (HOSPITAL_COMMUNITY): Payer: Self-pay

## 2022-08-27 ENCOUNTER — Other Ambulatory Visit: Payer: Self-pay

## 2022-09-01 ENCOUNTER — Other Ambulatory Visit (HOSPITAL_COMMUNITY): Payer: Self-pay

## 2022-09-04 ENCOUNTER — Encounter: Payer: Self-pay | Admitting: Hematology and Oncology

## 2022-09-04 ENCOUNTER — Telehealth: Payer: Self-pay | Admitting: *Deleted

## 2022-09-04 ENCOUNTER — Other Ambulatory Visit: Payer: Self-pay | Admitting: *Deleted

## 2022-09-04 ENCOUNTER — Encounter: Payer: Self-pay | Admitting: Adult Health

## 2022-09-04 DIAGNOSIS — C7801 Secondary malignant neoplasm of right lung: Secondary | ICD-10-CM

## 2022-09-04 DIAGNOSIS — C049 Malignant neoplasm of floor of mouth, unspecified: Secondary | ICD-10-CM

## 2022-09-04 DIAGNOSIS — Z95828 Presence of other vascular implants and grafts: Secondary | ICD-10-CM

## 2022-09-04 NOTE — Telephone Encounter (Signed)
Casey [name] called. He has CT scan next week on 2/7 and appt with Dr. Chryl Heck on 2/13. He said he has not had lab work in a while and wanted to know if it could be added. He is curious about his WBC. Dr Chryl Heck informed. Per Dr. Chryl Heck, yes to lab appt.  Labs ordered. Schedule message sent. Contacted patient with information as above and advised that scheduling will contact him to schedule lab and flush appt for 09/10/22 prior to CT. Pt verbalized understanding.

## 2022-09-10 ENCOUNTER — Encounter (HOSPITAL_COMMUNITY): Payer: Self-pay

## 2022-09-10 ENCOUNTER — Ambulatory Visit (HOSPITAL_COMMUNITY)
Admission: RE | Admit: 2022-09-10 | Discharge: 2022-09-10 | Disposition: A | Discharge status: Home / Self Care | Payer: Self-pay | Source: Ambulatory Visit | Attending: Hematology and Oncology | Admitting: Hematology and Oncology

## 2022-09-10 ENCOUNTER — Inpatient Hospital Stay: Payer: Self-pay | Attending: Hematology and Oncology

## 2022-09-10 ENCOUNTER — Other Ambulatory Visit: Payer: Self-pay

## 2022-09-10 ENCOUNTER — Ambulatory Visit: Payer: Medicaid Other | Admitting: Hematology and Oncology

## 2022-09-10 DIAGNOSIS — C7801 Secondary malignant neoplasm of right lung: Secondary | ICD-10-CM | POA: Insufficient documentation

## 2022-09-10 DIAGNOSIS — C7800 Secondary malignant neoplasm of unspecified lung: Secondary | ICD-10-CM | POA: Insufficient documentation

## 2022-09-10 DIAGNOSIS — C7802 Secondary malignant neoplasm of left lung: Secondary | ICD-10-CM | POA: Insufficient documentation

## 2022-09-10 DIAGNOSIS — C049 Malignant neoplasm of floor of mouth, unspecified: Secondary | ICD-10-CM

## 2022-09-10 DIAGNOSIS — Z452 Encounter for adjustment and management of vascular access device: Secondary | ICD-10-CM | POA: Insufficient documentation

## 2022-09-10 DIAGNOSIS — Z95828 Presence of other vascular implants and grafts: Secondary | ICD-10-CM

## 2022-09-10 LAB — CMP (CANCER CENTER ONLY)
ALT: 28 U/L (ref 0–44)
AST: 40 U/L (ref 15–41)
Albumin: 4.1 g/dL (ref 3.5–5.0)
Alkaline Phosphatase: 63 U/L (ref 38–126)
Anion gap: 6 (ref 5–15)
BUN: 7 mg/dL (ref 6–20)
CO2: 31 mmol/L (ref 22–32)
Calcium: 9.3 mg/dL (ref 8.9–10.3)
Chloride: 99 mmol/L (ref 98–111)
Creatinine: 0.66 mg/dL (ref 0.61–1.24)
GFR, Estimated: >60 mL/min (ref >60)
Glucose, Bld: 84 mg/dL (ref 70–99)
Potassium: 3.8 mmol/L (ref 3.5–5.1)
Sodium: 136 mmol/L (ref 135–145)
Total Bilirubin: 0.4 mg/dL (ref 0.3–1.2)
Total Protein: 6.8 g/dL (ref 6.5–8.1)

## 2022-09-10 LAB — CBC WITH DIFFERENTIAL (CANCER CENTER ONLY)
Abs Immature Granulocytes: 0.02 10*3/uL (ref 0.00–0.07)
Basophils Absolute: 0.1 10*3/uL (ref 0.0–0.1)
Basophils Relative: 1 %
Eosinophils Absolute: 0.1 10*3/uL (ref 0.0–0.5)
Eosinophils Relative: 1 %
HCT: 34.6 % — ABNORMAL LOW (ref 39.0–52.0)
Hemoglobin: 12.1 g/dL — ABNORMAL LOW (ref 13.0–17.0)
Immature Granulocytes: 0 %
Lymphocytes Relative: 13 %
Lymphs Abs: 0.8 10*3/uL (ref 0.7–4.0)
MCH: 33.6 pg (ref 26.0–34.0)
MCHC: 35 g/dL (ref 30.0–36.0)
MCV: 96.1 fL (ref 80.0–100.0)
Monocytes Absolute: 0.8 10*3/uL (ref 0.1–1.0)
Monocytes Relative: 13 %
Neutro Abs: 4.5 10*3/uL (ref 1.7–7.7)
Neutrophils Relative %: 72 %
Platelet Count: 257 10*3/uL (ref 150–400)
RBC: 3.6 MIL/uL — ABNORMAL LOW (ref 4.22–5.81)
RDW: 13 % (ref 11.5–15.5)
WBC Count: 6.2 10*3/uL (ref 4.0–10.5)
nRBC: 0 % (ref 0.0–0.2)

## 2022-09-10 MED ORDER — IOHEXOL 9 MG/ML PO SOLN
1000.0000 mL | ORAL | Status: AC
  Administered 2022-09-10: 1000 mL via ORAL

## 2022-09-10 MED ORDER — IOHEXOL 9 MG/ML PO SOLN
ORAL | Status: AC
  Filled 2022-09-10: qty 1000

## 2022-09-10 MED ORDER — IOHEXOL 300 MG/ML  SOLN
100.0000 mL | Freq: Once | INTRAMUSCULAR | Status: AC | PRN
  Administered 2022-09-10: 100 mL via INTRAVENOUS

## 2022-09-10 MED ORDER — SODIUM CHLORIDE 0.9% FLUSH
10.0000 mL | Freq: Once | INTRAVENOUS | Status: AC
  Administered 2022-09-10: 10 mL

## 2022-09-10 MED ORDER — HEPARIN SOD (PORK) LOCK FLUSH 100 UNIT/ML IV SOLN
500.0000 [IU] | Freq: Once | INTRAVENOUS | Status: AC
  Administered 2022-09-10: 500 [IU]

## 2022-09-10 MED ORDER — SODIUM CHLORIDE (PF) 0.9 % IJ SOLN
INTRAMUSCULAR | Status: AC
  Filled 2022-09-10: qty 50

## 2022-09-16 ENCOUNTER — Inpatient Hospital Stay (HOSPITAL_BASED_OUTPATIENT_CLINIC_OR_DEPARTMENT_OTHER): Payer: Self-pay | Admitting: Hematology and Oncology

## 2022-09-16 ENCOUNTER — Telehealth: Payer: Self-pay | Admitting: Hematology and Oncology

## 2022-09-16 VITALS — BP 138/86 | HR 76 | Temp 98.1°F | Resp 16 | Ht 63.0 in | Wt 115.6 lb

## 2022-09-16 DIAGNOSIS — C049 Malignant neoplasm of floor of mouth, unspecified: Secondary | ICD-10-CM

## 2022-09-16 NOTE — Telephone Encounter (Signed)
Spoke with patient confirming upcoming appointment 

## 2022-09-16 NOTE — Progress Notes (Signed)
[facility] Follow up:    Casey Stain, MD 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 CT maxillofacial on 12/22/19 showed postsurgical changes and mandibular invasion, with cervical lymphadenopathy consistent with metastatic nodes. PET scan on 02/16/20 showed the tongue mass with lymph node metastases on the right floor of the mouth and cervical lymph nodes.   03/07/2020 Cancer Staging   Staging form: Oral Cavity, AJCC 8th Edition - Clinical: Stage IVA (cT4a, cN2b, cM0) - Signed by Nicholas Lose, MD on 03/07/2020   04/04/2020 - 05/23/2020 Radiation Therapy    Site Technique Total Dose (Gy) Dose per Fx (Gy) Completed Fx Beam Energies  Tongue: [id number]_[id number] IMRT 70/70 2 35/35 6X    04/06/2020 - 05/17/2020 Chemotherapy   Patient is on Treatment Plan : HEAD/NECK Cisplatin + XRT q21d     02/06/2022 Imaging   IMPRESSION: 1. Progressive metastatic disease to the lungs as evidenced by increased size of numerous pre-existing pulmonary nodules, as above. No new pulmonary nodules are otherwise noted. No extra pulmonary metastatic disease noted elsewhere in the abdomen or pelvis. 2. Aortic atherosclerosis, in addition to left main and three-vessel coronary artery disease. Please note that although the presence of coronary artery calcium documents the presence of  coronary artery disease, the severity of this disease and any potential stenosis cannot be assessed on this non-gated CT examination. Assessment for potential risk factor modification, dietary therapy or pharmacologic therapy may be warranted, if clinically indicated. 3. Mild diffuse bronchial wall thickening with mild centrilobular and paraseptal emphysema; imaging findings suggestive of underlying COPD.     Electronically Signed   By: Vinnie Langton M.D.   On: 02/06/2022 08:46   03/31/2022 Initial Biopsy   Bronchoscopy LUNG, LUL, NEEDLE  BIOPSIES:  - Malignant cells consistent with squamous cell carcinoma   LUNG, LUL, BRUSHING:  - Malignant cells consistent with squamous cell carcinoma  PDL 0%, negative   Malignant neoplasm metastatic to lung ([place])  10/16/2021 Initial Diagnosis   Malignant neoplasm metastatic to lung ([place])   02/06/2022 Imaging   IMPRESSION: 1. Progressive metastatic disease to the lungs as evidenced by increased size of numerous pre-existing pulmonary nodules, as above. No new pulmonary nodules are otherwise noted. No extra pulmonary metastatic disease noted elsewhere in the abdomen or pelvis. 2. Aortic atherosclerosis, in addition to left main and three-vessel coronary artery disease. Please note that although the presence of coronary artery calcium documents the presence of coronary artery disease, the severity of this disease and any potential stenosis cannot be assessed on this non-gated CT examination. Assessment for potential risk factor modification, dietary therapy or pharmacologic therapy may be warranted, if clinically indicated. 3. Mild diffuse bronchial wall thickening with mild centrilobular and paraseptal emphysema; imaging findings suggestive of underlying COPD.     Electronically Signed   By: Vinnie Langton M.D.   On: 02/06/2022 08:46  03/31/2022 Initial Biopsy   Bronchoscopy LUNG, LUL, NEEDLE  BIOPSIES:  - Malignant cells  consistent with squamous cell carcinoma   LUNG, LUL, BRUSHING:  - Malignant cells consistent with squamous cell carcinoma  PDL 0%, negative   05/19/2022 -  Chemotherapy   Patient is on Treatment Plan : HEAD/NECK Pembrolizumab (200) D1 + Carboplatin (5) D1 + 5FU (1000) IVCI D1-4 q21d x 6 cycles / Pembrolizumab (200) D1 q21d       CURRENT THERAPY: Status postchemotherapy  INTERVAL HISTORY:  Casey [name] 57 y.o. male returns for follow-up . He arrived to the appointment today by himself.  He complains of some ongoing morning sickness and congestion.  He denies any cough, chest pain, shortness of breath or hemoptysis.  He once again is very reluctant to try chemotherapy however is open to listening to options of treatment.  He understands and has a realistic expectation of needing hospice down the road if he chooses not to pursue any further treatment. He wanted to review the imaging.  No other complaints today for me.  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  MEDICAL HISTORY: Past Medical History:  Diagnosis Date   Arthritis    Depression    Hypothyroidism    squamous cell ca of floor of mouth 12/2018    SURGICAL HISTORY: Past Surgical History:  Procedure Laterality Date   BRONCHIAL BIOPSY  03/31/2022   Procedure: BRONCHIAL BIOPSIES;  Surgeon: Collene Gobble, MD;  Location: [facility] ENDOSCOPY;  Service: Pulmonary;;   BRONCHIAL BRUSHINGS  03/31/2022    Procedure: BRONCHIAL BRUSHINGS;  Surgeon: Collene Gobble, MD;  Location: [facility] ENDOSCOPY;  Service: Pulmonary;;   BRONCHIAL NEEDLE ASPIRATION BIOPSY  03/31/2022   Procedure: BRONCHIAL NEEDLE ASPIRATION BIOPSIES;  Surgeon: Collene Gobble, MD;  Location: [facility] ENDOSCOPY;  Service: Pulmonary;;   EXCISION OF TONGUE LESION Left 09/17/2020   Procedure: Biopsy of Anterior Tongue;  Surgeon: [name], MD;  Location: [place];  Service: ENT;  Laterality: Left;   IR GASTROSTOMY TUBE MOD SED  04/03/2020   IR GASTROSTOMY TUBE REMOVAL  10/03/2020   IR IMAGING GUIDED PORT INSERTION  04/03/2020   IR IMAGING GUIDED PORT INSERTION  05/01/2022   IR REMOVAL TUN ACCESS W/ PORT W/O FL MOD SED  10/03/2020   VIDEO BRONCHOSCOPY WITH RADIAL ENDOBRONCHIAL ULTRASOUND  03/31/2022   Procedure: VIDEO BRONCHOSCOPY WITH RADIAL ENDOBRONCHIAL ULTRASOUND;  Surgeon: Collene Gobble, MD;  Location: [facility] ENDOSCOPY;  Service: Pulmonary;;    SOCIAL HISTORY: Social History   Socioeconomic History   Marital status: Single    Spouse name: Not on file   Number of children: Not on file   Years of education: Not on file   Highest education level: Not on file  Occupational History   Not on file  Tobacco Use   Smoking status: Every Day    Packs/day: 0.50    Types: Cigarettes   Smokeless tobacco: Never   Tobacco comments:    32  cigarettes smoked daily 04/17/22 [facility]   Vaping Use   Vaping Use: Never used  Substance and Sexual Activity   Alcohol use: Not Currently    Comment: a 12 pack of beer a week   Drug use: Never   Sexual activity: Not Currently  Other Topics Concern   Not on file  Social History Narrative   Not on file   Social Determinants of Health   Financial Resource Strain: Not on file  Food Insecurity: Not on file  Transportation Needs: Unmet Transportation Needs (07/17/2022)   PRAPARE - Hydrologist (Medical): Yes    Lack of Transportation (Non-Medical): Yes  Physical  Activity: Not on file  Stress: Not on file  Social Connections: Not on file  Intimate Partner Violence: Not on file    FAMILY HISTORY: No family history on file.  Review of Systems  Constitutional:  Negative for appetite change, chills, fatigue, fever and unexpected weight change.  HENT:   Positive for mouth sores and sore throat. Negative for hearing loss, lump/mass and trouble swallowing.   Eyes:  Negative for eye problems and icterus.  Respiratory:  Negative for chest tightness, cough and shortness of breath.   Cardiovascular:  Negative for chest pain, leg swelling and palpitations.  Gastrointestinal:  Negative for abdominal distention, abdominal pain, constipation, diarrhea, nausea and vomiting.  Endocrine: Negative for hot flashes.  Genitourinary:  Negative for difficulty urinating.   Musculoskeletal:  Negative for arthralgias.  Skin:  Negative for itching and rash.  Neurological:  Negative for dizziness, extremity weakness, headaches and numbness.  Hematological:  Negative for adenopathy. Does not bruise/bleed easily.  Psychiatric/Behavioral:  Negative for depression. The patient is not nervous/anxious.       PHYSICAL EXAMINATION  ECOG PERFORMANCE STATUS: 1 - Symptomatic but completely ambulatory  Vitals:   09/16/22 1353  BP: 138/86  Pulse: 76  Resp: 16  Temp: 98.1 F (36.7 C)  SpO2: 100%    General: Alert, oriented and in no acute distress Psych: Normal affect although he laughs at most things.  LABORATORY DATA:  CBC    Component Value Date/Time   WBC 6.2 09/10/2022 1031   WBC 3.4 (L) 06/03/2022 1316   RBC 3.60 (L) 09/10/2022 1031   HGB 12.1 (L) 09/10/2022 1031   HGB 13.0 09/17/2021 1511   HCT 34.6 (L) 09/10/2022 1031   HCT 37.2 (L) 09/17/2021 1511   PLT 257 09/10/2022 1031   PLT 266 09/17/2021 1511   MCV 96.1 09/10/2022 1031   MCV 100 (H) 09/17/2021 1511   MCH 33.6 09/10/2022 1031   MCHC 35.0 09/10/2022 1031   RDW 13.0 09/10/2022 1031   RDW 13.3  09/17/2021 1511   LYMPHSABS 0.8 09/10/2022 1031   LYMPHSABS 1.3 09/17/2021 1511   MONOABS 0.8 09/10/2022 1031   EOSABS 0.1 09/10/2022 1031   EOSABS 0.2 09/17/2021 1511   BASOSABS 0.1 09/10/2022 1031   BASOSABS 0.1 09/17/2021 1511    CMP     Component Value Date/Time   NA 136 09/10/2022 1031   NA 136 09/17/2021 1511   K 3.8 09/10/2022 1031   CL 99 09/10/2022 1031   CO2 31 09/10/2022 1031   GLUCOSE 84 09/10/2022 1031   BUN 7 09/10/2022 1031   BUN 9 09/17/2021 1511   CREATININE 0.66 09/10/2022 1031   CALCIUM 9.3 09/10/2022 1031   PROT 6.8 09/10/2022 1031   PROT 7.5 09/17/2021 1511   ALBUMIN 4.1 09/10/2022 1031   ALBUMIN 5.0 (H)  09/17/2021 1511   AST 40 09/10/2022 1031   ALT 28 09/10/2022 1031   ALKPHOS 63 09/10/2022 1031   BILITOT 0.4 09/10/2022 1031   GFRNONAA >60 09/10/2022 1031   GFRAA >60 05/03/2020 0940         ASSESSMENT and THERAPY PLAN:  1. SCC of floor of mouth,  [id number] M0 stage IVa clinical stage   PET scan on 02/16/20 showed the tongue mass with lymph node metastases on the right floor of the mouth and cervical lymph nodes. CK7 and CD 117 are negative, p63 and CK 5/6 are positive, p16 is negative Completed 3 Cycles of cisplatin (started 04/06/2020) with XRT   PET CT scan 08/28/2020: Increased tongue hypermetabolism SUV 15.2 more diffuse including posteriorly suggestive of progressive local disease.  Resolution of thoracic nodal hypermetabolism.  No evidence of extrathoracic metastases. Repeat biopsy showed  Focal necroinflammatory debris.  No carcinoma identified.  No  squamous mucosa present for evaluation.    He had CT chest in November 2022 which showed multiple new small pulmonary nodules consistent with pulmonary metastatic disease.  CT soft tissue neck showed satisfactory posttreatment appearance of the neck.   PET/CT done in December 2022 showed FDG avid pulmonary nodules in the upper lobes with enlarging base nodule in the right lung base suspicious  for metastatic disease.  [name] met him in December 2022 to discuss the imaging findings discussed about offering SBRT versus biopsy versus referral for systemic therapy.  He elected to monitor.     He then had imaging in March 2023 which showed several enlarging pulmonary nodules consistent with metastatic disease, patchy groundglass opacities in the left upper lobe and right lower lobe which are likely inflammatory.   He said he was not ready for a biopsy, he does not want to proceed with any treatment at this time but would rather monitor.  He understands that this could progress to widespread metastatic disease and may become symptomatic and hard to treat at a later date.     He was supposed to come back in 3 months with repeat scans.  He however had the scan scheduled but canceled since he was not feeling well.  He did not reschedule this.    He finally had CT imaging in July 2023 which showed progressive metastatic disease to the lungs, increased size of numerous pre-existing pulmonary nodules.  No extrapulmonary metastatic disease.   He then underwent bronchoscopy and biopsy of the left upper lobe lung nodule which showed squamous cell carcinoma He is here to discuss treatment planning given the metastatic squamous cell carcinoma along with his sister Ms. Ann.  We have today discussed the incurable nature of the disease, intent of treatment which is palliative.  We have discussed the median overall survival with chemoimmunotherapy.  We have discussed about considering carbo/5-FU with [facility] frontline for management of metastatic head and neck cancer.    He says Beryle Flock is off the table. He doesn't want to do the immunotherapy.  We have once again discussed about the benefits and risks of immunotherapy.  He insists that Keytruda be removed from the treatment plan.  He tried 1 cycle of carbo/5-FU and return to clinic for follow-up complaining of severe mouth pain, worse mucositis.  He  refused any more chemotherapy after this.  He wanted to continue follow-up with repeat imaging.  He is here to review his most recent imaging.  We have compared imaging from last week to imaging back in August and this  shows clear evidence of progression in the lungs.  There appears to be no evidence of progression outside the lungs.  I have also discussed about possible mention of coronary artery disease based on atherosclerosis noted on the CT imaging.  I have encouraged him to talk to his PCP about a cardiology referral.  I have once again brought up the topic of considering chemotherapy for treatment of metastatic head and neck cancer.  He said he does not want to do the 5-FU based regimen anymore.  He also had strong opinion not to consider immunotherapy.  I have given him printed information about paclitaxel as a single agent for management of head and neck cancer.  He wanted to read about it and will let us know if his opinion.  He tells me that the imaging has been costing him a a lot of money and he will think about doing routine scans.  I have then offered him to see me back in 3 to 4 months unless he decides to try the paclitaxel when he needs to call Anderson Malta our nurse navigator to keep [place] posted.  He expressed understanding of the recommendation.  Will try to image him if he has symptomatic progression.   We have once again discussed about hospice when he symptomatically progresses and if he is still does not want to pursue any treatment and he has a realistic understanding of it.   All questions were answered. The patient knows to call the clinic with any problems, questions or concerns. We can certainly see the patient much sooner if necessary.  Total encounter time:30 minutes*in face-to-face visit time, chart review, lab review, care coordination, order entry, and documentation of the encounter time.    *Total Encounter Time as defined by the Centers for Medicare and Medicaid Services  includes, in addition to the face-to-face time of a patient visit (documented in the note above) non-face-to-face time: obtaining and reviewing outside history, ordering and reviewing medications, tests or procedures, care coordination (communications with other health care professionals or caregivers) and documentation in the medical record.

## 2022-09-17 ENCOUNTER — Other Ambulatory Visit (HOSPITAL_COMMUNITY): Payer: Self-pay

## 2022-09-17 ENCOUNTER — Other Ambulatory Visit: Payer: Self-pay

## 2022-09-18 ENCOUNTER — Other Ambulatory Visit (HOSPITAL_COMMUNITY): Payer: Self-pay

## 2022-09-19 ENCOUNTER — Other Ambulatory Visit (HOSPITAL_COMMUNITY): Payer: Self-pay

## 2022-09-23 ENCOUNTER — Other Ambulatory Visit: Payer: Self-pay

## 2022-09-23 ENCOUNTER — Other Ambulatory Visit (HOSPITAL_COMMUNITY): Payer: Self-pay

## 2022-09-24 ENCOUNTER — Telehealth: Payer: Self-pay | Admitting: Emergency Medicine

## 2022-09-24 NOTE — Telephone Encounter (Signed)
I spoke to the patient today

## 2022-09-24 NOTE — Telephone Encounter (Signed)
Copied from [place] [id number]. Topic: General - Other >> Sep 23, 2022  2:36 PM Leone Payor F wrote: Reason for CRM: Patient wants Dr. Joya Gaskins to know that he's no long on Medicare so he can't make an appointment right now. He says he has some type of heart disease and needs him to diagnose him. Patient has filled out 2 forms and sent them to Social Security regarding him wanting Medicare B. Patient says that Medicare hasn't received anything from Dr. Joya Gaskins. Patient also wanted Dr. Joya Gaskins to look at the records from the heart disease diagnosis. Patient says he will make an appointment with Dr. Joya Gaskins when his Medicare coverage begins.

## 2022-09-29 ENCOUNTER — Other Ambulatory Visit (HOSPITAL_COMMUNITY): Payer: Self-pay

## 2022-10-04 NOTE — Progress Notes (Unsigned)
Established Patient Office Visit  Subjective:  Patient ID: Casey [name], male    DOB: Feb 05, 1966  Age: 57 y.o. MRN: [id number]  CC: Primary care visit to establish  HPI Casey [name] presents for primary care visit to establish face-to-face.  Note last visit was January 10 and was a video visit.   08/13/21 Casey [name] presents for primary care to establish.  This patient originally was living in [facility] but then moved to be closer to his sister in June 2020.  Prior to this in the fall 2019 in [place] he developed sores in the base of his tongue with hoarseness and swallowing difficulty.  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  He was being recommended reconstructive surgery he did not wish to have this we moved to [place] to be with his sister in June 2020 in the [place] area.  It was that time he connected with otolaryngology Dr. [name].  He also connected with medical oncology and radiation oncology at [facility] health.  He has had extensive radiotherapy and chemotherapy treatments to the base of his tongue.  Multiple notes are in our [facility] health system which I have reviewed in detail.  See the [facility] health epic system for these notes.  Currently the patient's not undergoing treatment he has had ulcerations in the mouth it is slowly healed multiple biopsies taken of these were negative for recurrent cancer.  He did have a recent PET scan which shows nodules in the lung which are new and a pleural-based lesion as well.  These are being observed for now.  Note the patient now is edentulous and is looking for Medicaid excepting dentist for dentures.  Patient is still smoking 4 to 5 cigarettes daily and does use a 7 mg nicotine patch.   Is able to eat some meals at this time.  He has no other complaints.  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  He has had extensive treatments of this documented at our last visit.  Patient seen today face-to-face.  He has developed a lesion on his lip which she is concerned about.  Is been there for several months and is not healing.  Patient also request a Medicaid dentist.  Patient also complains of occasional muscle spasticity in the stomach area.  Patient also needs colon cancer screening and is agreeable to Cologuard screening. Patient declined the flu vaccine.  He is agreeable to a pneumonia vaccination. Patient is no longer smoking cigarettes at this time and is using nicotine replacement The patient would like a dental examination to see if he is a candidate for dental implants The patient did successfully quit smoking using bupropion The patient has elevated TSH with previous radiation this needs to be further evaluated  02/03/22 The patient is experiencing increased nausea vomiting and episodes of diarrhea.  This seemed to occur when his dosage of levothyroxine was increased from 75 mcg to 100 mcg daily in May.  He still had elevated TSH levels and slightly low thyroid levels at that time.  However the patient simply not able to tolerate 100 mcg Synthroid dosing.  Symptoms worsened over the past week.  Patient was seen recently by oncology he has enlarging lung nodules they are recommending biopsy he wishes  to hold off and observe for now.  Concern is that he has metastatic cancer to the lung.  Patient is accompanied with his sister who also is advocating for the patient to hold off on biopsy of the lung nodules.  Patient's not taking any nutritional supplements is not eating well.  8/14 This patient seen in return follow-up and since the last visit he had a repeat CT scan of the chest in early July showing enlargement of both left and right upper lobe lung nodules consistent with metastatic disease.  Initially he was not  going to have biopsies performed but now has been referred to pulmonary medicine and they are going to perform on August 28 CT-guided super D imaging biopsies of the lung nodules.  Patient states he would like refills on his Zofran.  He states since reducing Synthroid to 75 mcg daily his side effects from the medication have resolved.  On arrival blood pressure initially elevated but on recheck was 123/78.  He is still smoking about 3 cigarettes daily.  There are no other complaints.  Past Medical History:  Diagnosis Date   Arthritis    Depression    Hypothyroidism    squamous cell ca of floor of mouth 12/2018    Past Surgical History:  Procedure Laterality Date   BRONCHIAL BIOPSY  03/31/2022   Procedure: BRONCHIAL BIOPSIES;  Surgeon: Collene Gobble, MD;  Location: [facility] ENDOSCOPY;  Service: Pulmonary;;   BRONCHIAL BRUSHINGS  03/31/2022   Procedure: BRONCHIAL BRUSHINGS;  Surgeon: Collene Gobble, MD;  Location: [facility] ENDOSCOPY;  Service: Pulmonary;;   BRONCHIAL NEEDLE ASPIRATION BIOPSY  03/31/2022   Procedure: BRONCHIAL NEEDLE ASPIRATION BIOPSIES;  Surgeon: Collene Gobble, MD;  Location: [facility] ENDOSCOPY;  Service: Pulmonary;;   EXCISION OF TONGUE LESION Left 09/17/2020   Procedure: Biopsy of Anterior Tongue;  Surgeon: [name], MD;  Location: [place];  Service: ENT;  Laterality: Left;   IR GASTROSTOMY TUBE MOD SED  04/03/2020   IR GASTROSTOMY TUBE REMOVAL  10/03/2020   IR IMAGING GUIDED PORT INSERTION  04/03/2020   IR IMAGING GUIDED PORT INSERTION  05/01/2022   IR REMOVAL TUN ACCESS W/ PORT W/O FL MOD SED  10/03/2020   VIDEO BRONCHOSCOPY WITH RADIAL ENDOBRONCHIAL ULTRASOUND  03/31/2022   Procedure: VIDEO BRONCHOSCOPY WITH RADIAL ENDOBRONCHIAL ULTRASOUND;  Surgeon: Collene Gobble, MD;  Location: [facility] ENDOSCOPY;  Service: Pulmonary;;    No family history on file.  Social History   Socioeconomic History   Marital status: Single    Spouse name: Not on file   Number of children:  Not on file   Years of education: Not on file   Highest education level: Not on file  Occupational History   Not on file  Tobacco Use   Smoking status: Every Day    Packs/day: 0.50    Types: Cigarettes   Smokeless tobacco: Never   Tobacco comments:    32 cigarettes smoked daily 04/17/22 [facility]   Vaping Use   Vaping Use: Never used  Substance and Sexual Activity   Alcohol use: Not Currently    Comment: a 12 pack of beer a week   Drug use: Never   Sexual activity: Not Currently  Other Topics Concern   Not on file  Social History Narrative   Not on file   Social Determinants of Health   Financial Resource Strain: Not on file  Food Insecurity: Not on file  Transportation Needs: Unmet Transportation Needs (07/17/2022)  PRAPARE - Hydrologist (Medical): Yes    Lack of Transportation (Non-Medical): Yes  Physical Activity: Not on file  Stress: Not on file  Social Connections: Not on file  Intimate Partner Violence: Not on file    Outpatient Medications Prior to Visit  Medication Sig Dispense Refill   buPROPion (WELLBUTRIN XL) 150 MG 24 hr tablet Take 1 tablet by mouth daily. 60 tablet 1   chlorhexidine (PERIDEX) 0.12 % solution Rinse with 4 teaspoonsfull then spit after use. Use once to twice daily. 240 mL 3   dexamethasone (DECADRON) 1 MG tablet Take no more than 1 tablet every other day, as needed for mouth swelling. 30 tablet 1   dicyclomine (BENTYL) 10 MG capsule Take 1 capsule (10 mg total) by mouth 3 (three) times daily as needed for spasms. 30 capsule 1   fluconazole (DIFLUCAN) 200 MG tablet Take 1 tablet (200 mg total) by mouth daily. 5 tablet 2   levothyroxine (SYNTHROID) 125 MCG tablet Take 1 tablet (125 mcg total) by mouth daily. 90 tablet 3   lidocaine (XYLOCAINE) 2 % solution MIX 1 PART 2% VISCOUS LIDOCAINE WITH 1 PART WATER. SWISH AND SPIT 10ML OF DILUTED MIXTURE TO NUMB MOUTH, UP TO 6 TIMES DAILY AS NEEDED 100 mL 4   lidocaine-prilocaine  (EMLA) cream Apply 1 Application topically as needed. 30 g 0   magic mouthwash (lidocaine, diphenhydrAMINE, alum & mag hydroxide) suspension Swish and spit 5 mls every 4 hours as needed 240 mL 1   Multiple Vitamin (MULTIVITAMIN) tablet Take 1 tablet by mouth daily.     ondansetron (ZOFRAN) 4 MG tablet Take 1 tablet (4 mg total) by mouth every 8 (eight) hours as needed for nausea or vomiting. 40 tablet 3   valACYclovir (VALTREX) 1000 MG tablet Take 1 tablet (1,000 mg total) by mouth 2 (two) times daily. 20 tablet 0   No facility-administered medications prior to visit.    No Known Allergies  ROS Review of Systems  Constitutional: Negative.  Negative for fever.  HENT:  Positive for trouble swallowing. Negative for ear pain, postnasal drip, rhinorrhea, sinus pressure, sore throat and voice change.   Eyes: Negative.   Respiratory: Negative.  Negative for apnea, cough, choking, chest tightness, shortness of breath, wheezing and stridor.   Cardiovascular: Negative.  Negative for chest pain, palpitations and leg swelling.  Gastrointestinal:  Negative for abdominal distention, abdominal pain, diarrhea, nausea and vomiting.  Genitourinary: Negative.   Musculoskeletal: Negative.  Negative for arthralgias and myalgias.  Skin:  Negative for rash and wound.  Allergic/Immunologic: Negative.  Negative for environmental allergies and food allergies.  Neurological: Negative.  Negative for dizziness, syncope, weakness and headaches.  Hematological: Negative.  Negative for adenopathy. Does not bruise/bleed easily.  Psychiatric/Behavioral: Negative.  Negative for agitation, dysphoric mood, sleep disturbance and suicidal ideas. The patient is not nervous/anxious.       Objective:    Physical Exam Vitals reviewed.  Constitutional:      Appearance: He is well-developed. He is not diaphoretic.     Comments: Weight has stabilized now up to 118 pounds  HENT:     Head: Normocephalic and atraumatic.      Nose: Nose normal. No nasal deformity, septal deviation, mucosal edema or rhinorrhea.     Right Sinus: No maxillary sinus tenderness or frontal sinus tenderness.     Left Sinus: No maxillary sinus tenderness or frontal sinus tenderness.     Mouth/Throat:  Pharynx: No oropharyngeal exudate.  Eyes:     General: No scleral icterus.    Conjunctiva/sclera: Conjunctivae normal.     Pupils: Pupils are equal, round, and reactive to light.  Neck:     Thyroid: No thyromegaly.     Vascular: No carotid bruit or JVD.     Trachea: Trachea normal. No tracheal tenderness or tracheal deviation.     Comments: The tissue at the top of the neck under the jaw is firm as evidence from previous radiotherapy Cardiovascular:     Rate and Rhythm: Normal rate and regular rhythm.     Chest Wall: PMI is not displaced.     Pulses: Normal pulses. No decreased pulses.     Heart sounds: Normal heart sounds, S1 normal and S2 normal. Heart sounds not distant. No murmur heard.    No systolic murmur is present.     No diastolic murmur is present.     No friction rub. No gallop. No S3 or S4 sounds.  Pulmonary:     Effort: No tachypnea, accessory muscle usage or respiratory distress.     Breath sounds: No stridor. No decreased breath sounds, wheezing, rhonchi or rales.  Chest:     Chest wall: No tenderness.  Abdominal:     General: Bowel sounds are normal. There is no distension.     Palpations: Abdomen is soft. Abdomen is not rigid.     Tenderness: There is no abdominal tenderness. There is no guarding or rebound.  Musculoskeletal:        General: Normal range of motion.     Cervical back: Normal range of motion. No edema, erythema or rigidity. No muscular tenderness. Normal range of motion.  Lymphadenopathy:     Head:     Right side of head: No submental or submandibular adenopathy.     Left side of head: No submental or submandibular adenopathy.     Cervical: No cervical adenopathy.  Skin:    General: Skin is  warm and dry.     Coloration: Skin is not pale.     Findings: No rash.     Nails: There is no clubbing.  Neurological:     Mental Status: He is alert and oriented to person, place, and time.     Sensory: No sensory deficit.  Psychiatric:        Speech: Speech normal.        Behavior: Behavior normal.     There were no vitals taken for this visit. Wt Readings from Last 3 Encounters:  09/16/22 115 lb 9.6 oz (52.4 kg)  06/11/22 115 lb (52.2 kg)  06/09/22 117 lb 3 oz (53.2 kg)     Health Maintenance Due  Topic Date Due   DTaP/Tdap/Td (1 - Tdap) Never done   Zoster Vaccines- Shingrix (1 of 2) Never done   INFLUENZA VACCINE  Never done   COVID-19 Vaccine (4 - 2023-24 season) 04/04/2022    There are no preventive care reminders to display for this patient.  Lab Results  Component Value Date   TSH 18.360 (H) 06/09/2022   Lab Results  Component Value Date   WBC 6.2 09/10/2022   HGB 12.1 (L) 09/10/2022   HCT 34.6 (L) 09/10/2022   MCV 96.1 09/10/2022   PLT 257 09/10/2022   Lab Results  Component Value Date   NA 136 09/10/2022   K 3.8 09/10/2022   CO2 31 09/10/2022   GLUCOSE 84 09/10/2022   BUN 7 09/10/2022  CREATININE 0.66 09/10/2022   BILITOT 0.4 09/10/2022   ALKPHOS 63 09/10/2022   AST 40 09/10/2022   ALT 28 09/10/2022   PROT 6.8 09/10/2022   ALBUMIN 4.1 09/10/2022   CALCIUM 9.3 09/10/2022   ANIONGAP 6 09/10/2022   EGFR 104 09/17/2021   Lab Results  Component Value Date   CHOL 255 (H) 09/17/2021   Lab Results  Component Value Date   HDL 79 09/17/2021   Lab Results  Component Value Date   LDLCALC 110 (H) 09/17/2021   Lab Results  Component Value Date   TRIG 388 (H) 09/17/2021   Lab Results  Component Value Date   CHOLHDL 3.2 09/17/2021   No results found for: "HGBA1C"    Assessment & Plan:   Problem List Items Addressed This Visit   None No orders of the defined types were placed in this encounter.  Follow-up: No follow-ups on file.     Asencion Noble, MD

## 2022-10-07 ENCOUNTER — Other Ambulatory Visit: Payer: Self-pay

## 2022-10-07 ENCOUNTER — Other Ambulatory Visit (HOSPITAL_COMMUNITY): Payer: Self-pay

## 2022-10-07 ENCOUNTER — Ambulatory Visit: Payer: Self-pay | Attending: Critical Care Medicine | Admitting: Critical Care Medicine

## 2022-10-07 ENCOUNTER — Encounter: Payer: Self-pay | Admitting: Critical Care Medicine

## 2022-10-07 VITALS — BP 123/87 | HR 80 | Wt 117.4 lb

## 2022-10-07 DIAGNOSIS — C7802 Secondary malignant neoplasm of left lung: Secondary | ICD-10-CM

## 2022-10-07 DIAGNOSIS — C7801 Secondary malignant neoplasm of right lung: Secondary | ICD-10-CM

## 2022-10-07 DIAGNOSIS — E039 Hypothyroidism, unspecified: Secondary | ICD-10-CM

## 2022-10-07 DIAGNOSIS — C049 Malignant neoplasm of floor of mouth, unspecified: Secondary | ICD-10-CM

## 2022-10-07 MED ORDER — FLUCONAZOLE 200 MG PO TABS
200.0000 mg | ORAL_TABLET | Freq: Every day | ORAL | 2 refills | Status: DC
  Filled 2022-10-07: qty 5, 5d supply, fill #0
  Filled 2022-11-06: qty 5, 5d supply, fill #1
  Filled 2022-12-24: qty 5, 5d supply, fill #2

## 2022-10-07 MED ORDER — ONDANSETRON HCL 4 MG PO TABS
4.0000 mg | ORAL_TABLET | Freq: Three times a day (TID) | ORAL | 3 refills | Status: DC | PRN
  Filled 2022-10-07: qty 40, 14d supply, fill #0
  Filled 2022-11-24: qty 40, 14d supply, fill #1
  Filled 2022-12-18: qty 40, 14d supply, fill #2
  Filled 2023-01-19: qty 40, 14d supply, fill #3

## 2022-10-07 NOTE — Assessment & Plan Note (Signed)
Continue with 125 mcg daily of thyroid supplement

## 2022-10-07 NOTE — Assessment & Plan Note (Signed)
No evidence of active disease in the floor of the mouth

## 2022-10-07 NOTE — Patient Instructions (Addendum)
I have asked Dr Lindi Adie to review your case  No heart evaluation needed  Refills on zofran and diflucan given  Lets get together again in 2 months

## 2022-10-07 NOTE — Assessment & Plan Note (Signed)
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

## 2022-10-14 ENCOUNTER — Ambulatory Visit: Payer: Medicaid Other | Admitting: Critical Care Medicine

## 2022-11-03 ENCOUNTER — Other Ambulatory Visit: Payer: Self-pay

## 2022-11-03 ENCOUNTER — Other Ambulatory Visit (HOSPITAL_COMMUNITY): Payer: Self-pay

## 2022-11-03 ENCOUNTER — Other Ambulatory Visit: Payer: Self-pay | Admitting: Emergency Medicine

## 2022-11-03 MED ORDER — CHLORHEXIDINE GLUCONATE 0.12 % MT SOLN
OROMUCOSAL | 3 refills | Status: DC
  Filled 2022-11-03: qty 473, 11d supply, fill #0
  Filled 2023-01-19: qty 473, 11d supply, fill #1
  Filled 2023-03-31 – 2023-04-01 (×3): qty 473, 11d supply, fill #2

## 2022-11-06 ENCOUNTER — Other Ambulatory Visit (HOSPITAL_COMMUNITY): Payer: Self-pay

## 2022-11-06 ENCOUNTER — Other Ambulatory Visit: Payer: Self-pay

## 2022-11-06 DIAGNOSIS — C049 Malignant neoplasm of floor of mouth, unspecified: Secondary | ICD-10-CM

## 2022-11-06 DIAGNOSIS — C7800 Secondary malignant neoplasm of unspecified lung: Secondary | ICD-10-CM

## 2022-11-06 NOTE — Progress Notes (Signed)
Oncology Nurse Navigator Documentation   Casey [name] sent me an email expressing his desire to not receive chemotherapy treatment for his lung metastasis as recommended by Dr. Chryl Heck. After much thought, research, and prayer he made the decision to not receive treatment. I have notified Dr. Chryl Heck and she has placed orders for removal of his PAC as requested by Casey [name]. There has also been a referral placed to palliative care here at [facility]. Casey [name] is feeling well at this time and at peace with his decision. He knows how to contact me if he has any needs in the future.   [name] RN, BSN, OCN Head & Neck Oncology Nurse [place] at [facility] Phone # [phone]  Fax # [phone]

## 2022-11-07 ENCOUNTER — Encounter: Payer: Self-pay | Admitting: Adult Health

## 2022-11-07 ENCOUNTER — Telehealth: Payer: Self-pay | Admitting: Nurse Practitioner

## 2022-11-07 ENCOUNTER — Encounter: Payer: Self-pay | Admitting: Hematology and Oncology

## 2022-11-07 ENCOUNTER — Other Ambulatory Visit: Payer: Self-pay

## 2022-11-07 DIAGNOSIS — C7800 Secondary malignant neoplasm of unspecified lung: Secondary | ICD-10-CM

## 2022-11-07 NOTE — Telephone Encounter (Signed)
Called patient per 4/5 staff message to schedule. Patient scheduled and notified. Sending message to transportation.

## 2022-11-10 ENCOUNTER — Other Ambulatory Visit: Payer: Self-pay

## 2022-11-10 ENCOUNTER — Other Ambulatory Visit (HOSPITAL_COMMUNITY): Payer: Self-pay

## 2022-11-11 ENCOUNTER — Encounter (HOSPITAL_COMMUNITY): Payer: Self-pay

## 2022-11-11 ENCOUNTER — Other Ambulatory Visit (HOSPITAL_COMMUNITY): Payer: Self-pay

## 2022-11-12 ENCOUNTER — Encounter: Payer: Self-pay | Admitting: Adult Health

## 2022-11-12 ENCOUNTER — Encounter: Payer: Self-pay | Admitting: Hematology and Oncology

## 2022-11-13 NOTE — Progress Notes (Deleted)
Palliative Medicine [facility]  Telephone:([phone]) [phone] Fax:(336) [phone]   Name: Casey [name] Date: 11/13/2022 MRN: [id number]  DOB: 1966-01-25  Patient Care Team: Storm Frisk, MD as PCP - General (Pulmonary Disease) Malmfelt, Lise Auer, RN as Oncology Nurse Navigator Lonie Peak, MD as Attending Physician (Radiation Oncology) Barron Alvine, CCC-SLP as Speech Language Pathologist (Speech Pathology) Jeanella Craze, Remi Deter, PT as Physical Therapist (Physical Therapy) Anabel Bene, RD as Dietitian (Nutrition) Anabel Bene, RD as Dietitian (Nutrition) Sallee Lange, LCSW (Inactive) as Social Worker (General Practice) Rachel Moulds, MD as Consulting Physician (Hematology and Oncology)    REASON FOR CONSULTATION: Casey [name] is a 57 y.o. 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 Per radiation oncology note, patient has declined to proceed with radiation therapy for his lung metastases. Palliative ask to see for symptom management and goals of care.    SOCIAL HISTORY:     reports that he has been smoking cigarettes. He has been smoking an average of .5 packs per day. He has never used smokeless tobacco. He reports that he does not currently use alcohol. He reports that he does not use drugs.  ADVANCE DIRECTIVES:  None on file  CODE STATUS:   PAST MEDICAL HISTORY: Past Medical History:  Diagnosis Date   Arthritis    Depression    Hypothyroidism    squamous cell ca of floor of mouth 12/2018    PAST SURGICAL HISTORY:  Past Surgical History:  Procedure Laterality Date   BRONCHIAL BIOPSY  03/31/2022   Procedure: BRONCHIAL BIOPSIES;  Surgeon: Leslye Peer, MD;  Location: [facility];  Service: Pulmonary;;   BRONCHIAL BRUSHINGS  03/31/2022   Procedure: BRONCHIAL BRUSHINGS;  Surgeon:  Leslye Peer, MD;  Location: [facility];  Service: Pulmonary;;   BRONCHIAL NEEDLE ASPIRATION BIOPSY  03/31/2022   Procedure: BRONCHIAL NEEDLE ASPIRATION BIOPSIES;  Surgeon: Leslye Peer, MD;  Location: [facility];  Service: Pulmonary;;   EXCISION OF TONGUE LESION Left 09/17/2020   Procedure: Biopsy of Anterior Tongue;  Surgeon: Serena Colonel, MD;  Location: [facility];  Service: ENT;  Laterality: Left;   IR GASTROSTOMY TUBE MOD SED  04/03/2020   IR GASTROSTOMY TUBE REMOVAL  10/03/2020   IR IMAGING GUIDED PORT INSERTION  04/03/2020   IR IMAGING GUIDED PORT INSERTION  05/01/2022   IR REMOVAL TUN ACCESS W/ PORT W/O FL MOD SED  10/03/2020   VIDEO BRONCHOSCOPY WITH RADIAL ENDOBRONCHIAL ULTRASOUND  03/31/2022   Procedure: VIDEO BRONCHOSCOPY WITH RADIAL ENDOBRONCHIAL ULTRASOUND;  Surgeon: Leslye Peer, MD;  Location: [facility];  Service: Pulmonary;;    HEMATOLOGY/ONCOLOGY HISTORY:  Oncology History  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  12/21/2019 Initial Diagnosis   In March 2021 he underwent dental etxractions and his dentists referred him for evaluation. 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 CT maxillofacial on 12/22/19 showed postsurgical changes and mandibular invasion, with cervical lymphadenopathy consistent with metastatic nodes. PET scan on 02/16/20 showed the tongue mass with lymph node metastases on the right floor of the mouth and cervical lymph nodes.   03/07/2020 Cancer Staging   Staging form: Oral Cavity, AJCC 8th Edition - Clinical: Stage IVA (cT4a, cN2b, cM0) - Signed by Serena Croissant, MD on 03/07/2020   04/04/2020 - 05/23/2020  Radiation Therapy    Site Technique Total Dose (Gy) Dose per Fx (Gy) Completed Fx Beam Energies  Tongue: [id number]_tongFOM IMRT 70/70 2 35/35 6X    04/06/2020 - 05/17/2020 Chemotherapy   Patient is on Treatment Plan : HEAD/NECK Cisplatin + XRT q21d     02/06/2022  Imaging   IMPRESSION: 1. Progressive metastatic disease to the lungs as evidenced by increased size of numerous pre-existing pulmonary nodules, as above. No new pulmonary nodules are otherwise noted. No extra pulmonary metastatic disease noted elsewhere in the abdomen or pelvis. 2. Aortic atherosclerosis, in addition to left main and three-vessel coronary artery disease. Please note that although the presence of coronary artery calcium documents the presence of coronary artery disease, the severity of this disease and any potential stenosis cannot be assessed on this non-gated CT examination. Assessment for potential risk factor modification, dietary therapy or pharmacologic therapy may be warranted, if clinically indicated. 3. Mild diffuse bronchial wall thickening with mild centrilobular and paraseptal emphysema; imaging findings suggestive of underlying COPD.     Electronically Signed   By: Trudie Reed M.D.   On: 02/06/2022 08:46   03/31/2022 Initial Biopsy   Bronchoscopy LUNG, LUL, NEEDLE  BIOPSIES:  - Malignant cells consistent with squamous cell carcinoma   LUNG, LUL, BRUSHING:  - Malignant cells consistent with squamous cell carcinoma  PDL 0%, negative   Malignant neoplasm metastatic to lung  10/16/2021 Initial Diagnosis   Malignant neoplasm metastatic to lung (HCC)   02/06/2022 Imaging   IMPRESSION: 1. Progressive metastatic disease to the lungs as evidenced by increased size of numerous pre-existing pulmonary nodules, as above. No new pulmonary nodules are otherwise noted. No extra pulmonary metastatic disease noted elsewhere in the abdomen or pelvis. 2. Aortic atherosclerosis, in addition to left main and three-vessel coronary artery disease. Please note that although the presence of coronary artery calcium documents the presence of coronary artery disease, the severity of this disease and any potential stenosis cannot be assessed on this non-gated CT  examination. Assessment for potential risk factor modification, dietary therapy or pharmacologic therapy may be warranted, if clinically indicated. 3. Mild diffuse bronchial wall thickening with mild centrilobular and paraseptal emphysema; imaging findings suggestive of underlying COPD.     Electronically Signed   By: Trudie Reed M.D.   On: 02/06/2022 08:46   03/31/2022 Initial Biopsy   Bronchoscopy LUNG, LUL, NEEDLE  BIOPSIES:  - Malignant cells consistent with squamous cell carcinoma   LUNG, LUL, BRUSHING:  - Malignant cells consistent with squamous cell carcinoma  PDL 0%, negative   05/19/2022 -  Chemotherapy   Patient is on Treatment Plan : HEAD/NECK Pembrolizumab (200) D1 + Carboplatin (5) D1 + 5FU (1000) IVCI D1-4 q21d x 6 cycles / Pembrolizumab (200) D1 q21d       ALLERGIES:  has No Known Allergies.  MEDICATIONS:  Current Outpatient Medications  Medication Sig Dispense Refill   buPROPion (WELLBUTRIN XL) 150 MG 24 hr tablet Take 1 tablet by mouth daily. 60 tablet 1   chlorhexidine (PERIDEX) 0.12 % solution Rinse mouth with 20 mL then spit after use. Use once to twice daily. 473 mL 3   dexamethasone (DECADRON) 1 MG tablet Take no more than 1 tablet every other day, as needed for mouth swelling. 30 tablet 1   dicyclomine (BENTYL) 10 MG capsule Take 1 capsule (10 mg total) by mouth 3 (three) times daily as needed for spasms. 30 capsule 1   fluconazole (DIFLUCAN) 200 MG tablet Take 1  tablet (200 mg total) by mouth daily. 5 tablet 2   levothyroxine (SYNTHROID) 125 MCG tablet Take 1 tablet (125 mcg total) by mouth daily. 90 tablet 3   lidocaine (XYLOCAINE) 2 % solution MIX 1 PART 2% VISCOUS LIDOCAINE WITH 1 PART WATER. SWISH AND SPIT 10ML OF DILUTED MIXTURE TO NUMB MOUTH, UP TO 6 TIMES DAILY AS NEEDED 100 mL 4   lidocaine-prilocaine (EMLA) cream Apply 1 Application topically as needed. 30 g 0   magic mouthwash (lidocaine, diphenhydrAMINE, alum & mag hydroxide) suspension  Swish and spit 5 mls every 4 hours as needed (Patient not taking: Reported on 10/07/2022) 240 mL 1   Multiple Vitamin (MULTIVITAMIN) tablet Take 1 tablet by mouth daily.     ondansetron (ZOFRAN) 4 MG tablet Take 1 tablet (4 mg total) by mouth every 8 (eight) hours as needed for nausea or vomiting. 40 tablet 3   valACYclovir (VALTREX) 1000 MG tablet Take 1 tablet (1,000 mg total) by mouth 2 (two) times daily. 20 tablet 0   No current facility-administered medications for this visit.    VITAL SIGNS: There were no vitals taken for this visit. There were no vitals filed for this visit.  Estimated body mass index is 20.8 kg/m as calculated from the following:   Height as of 09/16/22: 5\' 3"  (1.6 m).   Weight as of 10/07/22: 117 lb 6.4 oz (53.3 kg).  LABS: CBC:    Component Value Date/Time   WBC 6.2 09/10/2022 1031   WBC 3.4 (L) 06/03/2022 1316   HGB 12.1 (L) 09/10/2022 1031   HGB 13.0 09/17/2021 1511   HCT 34.6 (L) 09/10/2022 1031   HCT 37.2 (L) 09/17/2021 1511   PLT 257 09/10/2022 1031   PLT 266 09/17/2021 1511   MCV 96.1 09/10/2022 1031   MCV 100 (H) 09/17/2021 1511   NEUTROABS 4.5 09/10/2022 1031   NEUTROABS 4.2 09/17/2021 1511   LYMPHSABS 0.8 09/10/2022 1031   LYMPHSABS 1.3 09/17/2021 1511   MONOABS 0.8 09/10/2022 1031   EOSABS 0.1 09/10/2022 1031   EOSABS 0.2 09/17/2021 1511   BASOSABS 0.1 09/10/2022 1031   BASOSABS 0.1 09/17/2021 1511   Comprehensive Metabolic Panel:    Component Value Date/Time   NA 136 09/10/2022 1031   NA 136 09/17/2021 1511   K 3.8 09/10/2022 1031   CL 99 09/10/2022 1031   CO2 31 09/10/2022 1031   BUN 7 09/10/2022 1031   BUN 9 09/17/2021 1511   CREATININE 0.66 09/10/2022 1031   GLUCOSE 84 09/10/2022 1031   CALCIUM 9.3 09/10/2022 1031   AST 40 09/10/2022 1031   ALT 28 09/10/2022 1031   ALKPHOS 63 09/10/2022 1031   BILITOT 0.4 09/10/2022 1031   PROT 6.8 09/10/2022 1031   PROT 7.5 09/17/2021 1511   ALBUMIN 4.1 09/10/2022 1031   ALBUMIN 5.0 (H)  09/17/2021 1511    RADIOGRAPHIC STUDIES: No results found.  PERFORMANCE STATUS (ECOG) : {CHL ONC ECOG ZO:[id number]}PS:[id number]}  Review of Systems Unless otherwise noted, a complete review of systems is negative.  Physical Exam General: NAD Cardiovascular: regular rate and rhythm Pulmonary: clear ant fields Abdomen: soft, nontender, + bowel sounds Extremities: no edema, no joint deformities Skin: no rashes Neurological:  IMPRESSION: *** I introduced myself, Rayner Erman RN, and Palliative's role in collaboration with the oncology team. Concept of Palliative Care was introduced as specialized medical care for people and their families living with serious illness.  It focuses on providing relief from the symptoms and stress of a serious  illness.  The goal is to improve quality of life for both the patient and the family. Values and goals of care important to patient and family were attempted to be elicited.    We discussed *** current illness and what it means in the larger context of *** on-going co-morbidities. Natural disease trajectory and expectations were discussed.  I discussed the importance of continued conversation with family and their medical providers regarding overall plan of care and treatment options, ensuring decisions are within the context of the patients values and GOCs.  PLAN: Established therapeutic relationship. Education provided on palliative's role in collaboration with their Oncology/Radiation team. I will plan to see patient back in 2-4 weeks in collaboration to other oncology appointments.    Patient expressed understanding and was in agreement with this plan. He also understands that He can call the clinic at any time with any questions, concerns, or complaints.   Thank you for your referral and allowing Palliative to assist in [name]'s care.   Number and complexity of problems addressed: ***HIGH - 1 or more chronic illnesses with SEVERE  exacerbation, progression, or side effects of treatment - advanced cancer, pain. Any controlled substances utilized were prescribed in the context of palliative care.   Visit consisted of counseling and education dealing with the complex and emotionally intense issues of symptom management and palliative care in the setting of serious and potentially life-threatening illness.Greater than 50%  of this time was spent counseling and coordinating care related to the above assessment and plan.  Signed by: Willette Alma, AGPCNP-BC Palliative Medicine Team/ Cancer Center   *Please note that this is a verbal dictation therefore any spelling or grammatical errors are due to the "[employer]" system interpretation.

## 2022-11-14 ENCOUNTER — Other Ambulatory Visit: Payer: Self-pay

## 2022-11-18 ENCOUNTER — Other Ambulatory Visit (HOSPITAL_COMMUNITY): Payer: Self-pay

## 2022-11-19 ENCOUNTER — Telehealth: Payer: Self-pay | Admitting: Nurse Practitioner

## 2022-11-19 ENCOUNTER — Inpatient Hospital Stay: Payer: Medicare Other

## 2022-11-19 NOTE — Telephone Encounter (Signed)
Rescheduled 4/17 appointment due to lack of transportation. Rescheduled patient and forwarded information to transportation liaison to see if we can provide assistance to patient. Patient notified.

## 2022-11-21 NOTE — Progress Notes (Deleted)
Palliative Medicine [facility]  [phone] [phone]   Name: Casey [name] Date: 11/21/2022 MRN: [id number]  DOB: 1965/10/09  Patient Care Team: Storm Frisk, MD as PCP - General (Pulmonary Disease) Malmfelt, Lise Auer, RN as Oncology Nurse Navigator Lonie Peak, MD as Attending Physician (Radiation Oncology) Barron Alvine, CCC-SLP as Speech Language Pathologist (Speech Pathology) Jeanella Craze, Remi Deter, PT as Physical Therapist (Physical Therapy) Anabel Bene, RD as Dietitian (Nutrition) Anabel Bene, RD as Dietitian (Nutrition) Sallee Lange, LCSW (Inactive) as Social Worker (General Practice) Rachel Moulds, MD as Consulting Physician (Hematology and Oncology)    REASON FOR CONSULTATION: Casey [name] is a 57 y.o. 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 Patient was offered chemotherapy for his metastatic lung disease, however patient declined to pursue further treatment. Palliative ask to see for symptom management and goals of care.    SOCIAL HISTORY:     reports that he has been smoking cigarettes. He has been smoking an average of .5 packs per day. He has never used smokeless tobacco. He reports that he does not currently use alcohol. He reports that he does not use drugs.  ADVANCE DIRECTIVES:  None on file  CODE STATUS:   PAST MEDICAL HISTORY: Past Medical History:  Diagnosis Date   Arthritis    Depression    Hypothyroidism    squamous cell ca of floor of mouth 12/2018    PAST SURGICAL HISTORY:  Past Surgical History:  Procedure Laterality Date   BRONCHIAL BIOPSY  03/31/2022   Procedure: BRONCHIAL BIOPSIES;  Surgeon: Leslye Peer, MD;  Location: [facility];  Service: Pulmonary;;   BRONCHIAL BRUSHINGS  03/31/2022   Procedure: BRONCHIAL BRUSHINGS;  Surgeon: Leslye Peer, MD;   Location: [facility];  Service: Pulmonary;;   BRONCHIAL NEEDLE ASPIRATION BIOPSY  03/31/2022   Procedure: BRONCHIAL NEEDLE ASPIRATION BIOPSIES;  Surgeon: Leslye Peer, MD;  Location: [facility];  Service: Pulmonary;;   EXCISION OF TONGUE LESION Left 09/17/2020   Procedure: Biopsy of Anterior Tongue;  Surgeon: Serena Colonel, MD;  Location: [facility];  Service: ENT;  Laterality: Left;   IR GASTROSTOMY TUBE MOD SED  04/03/2020   IR GASTROSTOMY TUBE REMOVAL  10/03/2020   IR IMAGING GUIDED PORT INSERTION  04/03/2020   IR IMAGING GUIDED PORT INSERTION  05/01/2022   IR REMOVAL TUN ACCESS W/ PORT W/O FL MOD SED  10/03/2020   VIDEO BRONCHOSCOPY WITH RADIAL ENDOBRONCHIAL ULTRASOUND  03/31/2022   Procedure: VIDEO BRONCHOSCOPY WITH RADIAL ENDOBRONCHIAL ULTRASOUND;  Surgeon: Leslye Peer, MD;  Location: [facility];  Service: Pulmonary;;    HEMATOLOGY/ONCOLOGY HISTORY:  Oncology History  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  12/21/2019 Initial Diagnosis   In March 2021 he underwent dental etxractions and his dentists referred him for evaluation. 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 CT maxillofacial on 12/22/19 showed postsurgical changes and mandibular invasion, with cervical lymphadenopathy consistent with metastatic nodes. PET scan on 02/16/20 showed the tongue mass with lymph node metastases on the right floor of the mouth and cervical lymph nodes.   03/07/2020 Cancer Staging   Staging form: Oral Cavity, AJCC 8th Edition - Clinical: Stage IVA (cT4a, cN2b, cM0) - Signed by Serena Croissant, MD on 03/07/2020   04/04/2020 - 05/23/2020 Radiation Therapy  Site Technique Total Dose (Gy) Dose per Fx (Gy) Completed Fx Beam Energies  Tongue: HN_tongFOM IMRT 70/70 2 35/35 6X    04/06/2020 - 05/17/2020 Chemotherapy   Patient is on Treatment Plan : HEAD/NECK Cisplatin + XRT q21d     02/06/2022 Imaging    IMPRESSION: 1. Progressive metastatic disease to the lungs as evidenced by increased size of numerous pre-existing pulmonary nodules, as above. No new pulmonary nodules are otherwise noted. No extra pulmonary metastatic disease noted elsewhere in the abdomen or pelvis. 2. Aortic atherosclerosis, in addition to left main and three-vessel coronary artery disease. Please note that although the presence of coronary artery calcium documents the presence of coronary artery disease, the severity of this disease and any potential stenosis cannot be assessed on this non-gated CT examination. Assessment for potential risk factor modification, dietary therapy or pharmacologic therapy may be warranted, if clinically indicated. 3. Mild diffuse bronchial wall thickening with mild centrilobular and paraseptal emphysema; imaging findings suggestive of underlying COPD.     Electronically Signed   By: Trudie Reed M.D.   On: 02/06/2022 08:46   03/31/2022 Initial Biopsy   Bronchoscopy LUNG, LUL, NEEDLE  BIOPSIES:  - Malignant cells consistent with squamous cell carcinoma   LUNG, LUL, BRUSHING:  - Malignant cells consistent with squamous cell carcinoma  PDL 0%, negative   Malignant neoplasm metastatic to lung  10/16/2021 Initial Diagnosis   Malignant neoplasm metastatic to lung (HCC)   02/06/2022 Imaging   IMPRESSION: 1. Progressive metastatic disease to the lungs as evidenced by increased size of numerous pre-existing pulmonary nodules, as above. No new pulmonary nodules are otherwise noted. No extra pulmonary metastatic disease noted elsewhere in the abdomen or pelvis. 2. Aortic atherosclerosis, in addition to left main and three-vessel coronary artery disease. Please note that although the presence of coronary artery calcium documents the presence of coronary artery disease, the severity of this disease and any potential stenosis cannot be assessed on this non-gated CT examination.  Assessment for potential risk factor modification, dietary therapy or pharmacologic therapy may be warranted, if clinically indicated. 3. Mild diffuse bronchial wall thickening with mild centrilobular and paraseptal emphysema; imaging findings suggestive of underlying COPD.     Electronically Signed   By: Trudie Reed M.D.   On: 02/06/2022 08:46   03/31/2022 Initial Biopsy   Bronchoscopy LUNG, LUL, NEEDLE  BIOPSIES:  - Malignant cells consistent with squamous cell carcinoma   LUNG, LUL, BRUSHING:  - Malignant cells consistent with squamous cell carcinoma  PDL 0%, negative   05/19/2022 -  Chemotherapy   Patient is on Treatment Plan : HEAD/NECK Pembrolizumab (200) D1 + Carboplatin (5) D1 + 5FU (1000) IVCI D1-4 q21d x 6 cycles / Pembrolizumab (200) D1 q21d       ALLERGIES:  has No Known Allergies.  MEDICATIONS:  Current Outpatient Medications  Medication Sig Dispense Refill   buPROPion (WELLBUTRIN XL) 150 MG 24 hr tablet Take 1 tablet by mouth daily. 60 tablet 1   chlorhexidine (PERIDEX) 0.12 % solution Rinse mouth with 20 mL then spit after use. Use once to twice daily. 473 mL 3   dexamethasone (DECADRON) 1 MG tablet Take no more than 1 tablet every other day, as needed for mouth swelling. 30 tablet 1   dicyclomine (BENTYL) 10 MG capsule Take 1 capsule (10 mg total) by mouth 3 (three) times daily as needed for spasms. 30 capsule 1   fluconazole (DIFLUCAN) 200 MG tablet Take 1 tablet (200 mg total) by  mouth daily. 5 tablet 2   levothyroxine (SYNTHROID) 125 MCG tablet Take 1 tablet (125 mcg total) by mouth daily. 90 tablet 3   lidocaine (XYLOCAINE) 2 % solution MIX 1 PART 2% VISCOUS LIDOCAINE WITH 1 PART WATER. SWISH AND SPIT OF DILUTED MIXTURE TO NUMB MOUTH, UP TO 6 TIMES DAILY AS NEEDED 100 mL 4   lidocaine-prilocaine (EMLA) cream Apply 1 Application topically as needed. 30 g 0   magic mouthwash (lidocaine, diphenhydrAMINE, alum & mag hydroxide) suspension Swish and spit 5  mls every 4 hours as needed (Patient not taking: Reported on 10/07/2022) 240 mL 1   Multiple Vitamin (MULTIVITAMIN) tablet Take 1 tablet by mouth daily.     ondansetron (ZOFRAN) 4 MG tablet Take 1 tablet (4 mg total) by mouth every 8 (eight) hours as needed for nausea or vomiting. 40 tablet 3   valACYclovir (VALTREX) 1000 MG tablet Take 1 tablet (1,000 mg total) by mouth 2 (two) times daily. 20 tablet 0   No current facility-administered medications for this visit.    VITAL SIGNS: There were no vitals taken for this visit. There were no vitals filed for this visit.  Estimated body mass index is 20.8 kg/m as calculated from the following:   Height as of 09/16/22:  (1.6 m).   Weight as of 10/07/22: 117 lb 6.4 oz (53.3 kg).  LABS: CBC:    Component Value Date/Time   WBC 6.2 09/10/2022 1031   WBC 3.4 (L) 06/03/2022 1316   HGB 12.1 (L) 09/10/2022 1031   HGB 13.0 09/17/2021 1511   HCT 34.6 (L) 09/10/2022 1031   HCT 37.2 (L) 09/17/2021 1511   PLT 257 09/10/2022 1031   PLT 266 09/17/2021 1511   MCV 96.1 09/10/2022 1031   MCV 100 (H) 09/17/2021 1511   NEUTROABS 4.5 09/10/2022 1031   NEUTROABS 4.2 09/17/2021 1511   LYMPHSABS 0.8 09/10/2022 1031   LYMPHSABS 1.3 09/17/2021 1511   MONOABS 0.8 09/10/2022 1031   EOSABS 0.1 09/10/2022 1031   EOSABS 0.2 09/17/2021 1511   BASOSABS 0.1 09/10/2022 1031   BASOSABS 0.1 09/17/2021 1511   Comprehensive Metabolic Panel:    Component Value Date/Time   NA 136 09/10/2022 1031   NA 136 09/17/2021 1511   K 3.8 09/10/2022 1031   CL 99 09/10/2022 1031   CO2 31 09/10/2022 1031   BUN 7 09/10/2022 1031   BUN 9 09/17/2021 1511   CREATININE 0.66 09/10/2022 1031   GLUCOSE 84 09/10/2022 1031   CALCIUM 9.3 09/10/2022 1031   AST 40 09/10/2022 1031   ALT 28 09/10/2022 1031   ALKPHOS 63 09/10/2022 1031   BILITOT 0.4 09/10/2022 1031   PROT 6.8 09/10/2022 1031   PROT 7.5 09/17/2021 1511   ALBUMIN 4.1 09/10/2022 1031   ALBUMIN 5.0 (H) 09/17/2021 1511     RADIOGRAPHIC STUDIES: No results found.  PERFORMANCE STATUS (ECOG) : {CHL ONC ECOG [id number]}  Review of Systems Unless otherwise noted, a complete review of systems is negative.  Physical Exam General: NAD Cardiovascular: regular rate and rhythm Pulmonary: clear ant fields Abdomen: soft, nontender, + bowel sounds Extremities: no edema, no joint deformities Skin: no rashes Neurological:  IMPRESSION: *** I introduced myself, Romana Deaton RN, and Palliative's role in collaboration with the oncology team. Concept of Palliative Care was introduced as specialized medical care for people and their families living with serious illness.  It focuses on providing relief from the symptoms and stress of a serious illness.  The goal is  to improve quality of life for both the patient and the family. Values and goals of care important to patient and family were attempted to be elicited.    We discussed *** current illness and what it means in the larger context of *** on-going co-morbidities. Natural disease trajectory and expectations were discussed.  I discussed the importance of continued conversation with family and their medical providers regarding overall plan of care and treatment options, ensuring decisions are within the context of the patients values and GOCs.  PLAN: Established therapeutic relationship. Education provided on palliative's role in collaboration with their Oncology/Radiation team. I will plan to see patient back in 2-4 weeks in collaboration to other oncology appointments.    Patient expressed understanding and was in agreement with this plan. He also understands that He can call the clinic at any time with any questions, concerns, or complaints.   Thank you for your referral and allowing Palliative to assist in [name]'s care.   Number and complexity of problems addressed: ***HIGH - 1 or more chronic illnesses with SEVERE exacerbation, progression,  or side effects of treatment - advanced cancer, pain. Any controlled substances utilized were prescribed in the context of palliative care.   Visit consisted of counseling and education dealing with the complex and emotionally intense issues of symptom management and palliative care in the setting of serious and potentially life-threatening illness.Greater than 50%  of this time was spent counseling and coordinating care related to the above assessment and plan.  Signed by: Willette Alma, AGPCNP-BC Palliative Medicine Team/[facility]   *Please note that this is a verbal dictation therefore any spelling or grammatical errors are due to the "[employer] One" system interpretation.

## 2022-11-24 ENCOUNTER — Inpatient Hospital Stay (HOSPITAL_COMMUNITY)
Admission: RE | Admit: 2022-11-24 | Discharge: 2022-11-24 | Disposition: A | Discharge status: Home / Self Care | Payer: Medicare Other | Source: Ambulatory Visit | Attending: Hematology and Oncology | Admitting: Hematology and Oncology

## 2022-11-24 ENCOUNTER — Inpatient Hospital Stay: Payer: Self-pay | Attending: Hematology and Oncology | Admitting: Nurse Practitioner

## 2022-11-24 ENCOUNTER — Other Ambulatory Visit (HOSPITAL_COMMUNITY): Payer: Self-pay

## 2022-11-24 ENCOUNTER — Ambulatory Visit: Payer: Medicare Other | Admitting: *Deleted

## 2022-11-24 NOTE — Telephone Encounter (Signed)
Summary: cannot keep anyting down,vomiting,possibly dehydrated   Pt stated he has been experiencing severe stomach convulsions, abdominal pain, vomiting, and cannot keep anything down. He stated that he is probably dehydrated as he feels wiped out.  Stated no abdominal pain right at this moment.  Pt is requesting an appointment no appointments available.  Seeking clinical advice.      Chief Complaint: vomiting- sips only at this point Symptoms: cold symptoms, vomiting that started yesterday- patient is doing sips now and using Zofran- helping a little. Patient would like appointment either virtual today or in office tomorrow with PCP. Patient advised to continue sips, be vigilant for signs of dehydration and go to ED for fluids if needed. Patient is aware. Frequency: vomiting started yesterday Pertinent Negatives: Patient denies fever, headache, vertigo, vomiting blood or coffee grounds, recent head injury Disposition: ED /[] Urgent Care (no appt availability in office) / Appointment(In office/virtual)/  [facility]/ Home Care/ Refused Recommended Disposition /[] [facility]/  Follow-up with PCP Additional Notes: Patient advised will send message to PCP- he does not want to come to office today- but he can do virtual visit- he will come tomorrow. Please let him know.   Reason for Disposition  [1] SEVERE vomiting (e.g., 6 or more times/day) AND [2] present > 8 hours (Exception: Patient sounds well, is drinking liquids, does not sound dehydrated, and vomiting has lasted less than 24 hours.)    Patient states he  Answer Assessment - Initial Assessment Questions 1. VOMITING SEVERITY: "How many times have you vomited in the past 24 hours?"     - MILD:  1 - 2 times/day    - MODERATE: 3 - 5 times/day, decreased oral intake without significant weight loss or symptoms of dehydration    - SEVERE: 6 or more times/day, vomits everything or nearly everything, with  significant weight loss, symptoms of dehydration      severe 2. ONSET: "When did the vomiting begin?"      Started yesterday am 3. FLUIDS: "What fluids or food have you vomited up today?" "Have you been able to keep any fluids down?"     Unable to keep anything down 4. ABDOMEN PAIN: "Are your having any abdomen pain?" If Yes : "How bad is it and what does it feel like?" (e.g., crampy, dull, intermittent, constant)      Abdominal spasm- precursor to vomiting 5. DIARRHEA: "Is there any diarrhea?" If Yes, ask: "How many times today?"      no 6. CONTACTS: "Is there anyone else in the family with the same symptoms?"      No- cold symptoms only 7. CAUSE: "What do you think is causing your vomiting?"     Not sure 8. HYDRATION STATUS: "Any signs of dehydration?" (e.g., dry mouth [not only dry lips], too weak to stand) "When did you last urinate?"     Weight loss- up until yesterday able to drink fluids- doing sips 9. OTHER SYMPTOMS: "Do you have any other symptoms?" (e.g., fever, headache, vertigo, vomiting blood or coffee grounds, recent head injury)     no  Protocols used: Vomiting-A-AH

## 2022-11-25 ENCOUNTER — Other Ambulatory Visit (HOSPITAL_COMMUNITY): Payer: Self-pay

## 2022-11-25 ENCOUNTER — Telehealth: Payer: Self-pay | Admitting: Nurse Practitioner

## 2022-11-25 ENCOUNTER — Encounter: Payer: Self-pay | Admitting: Adult Health

## 2022-11-25 ENCOUNTER — Ambulatory Visit: Payer: Medicare Other | Admitting: Family Medicine

## 2022-11-25 ENCOUNTER — Other Ambulatory Visit: Payer: Self-pay

## 2022-11-25 ENCOUNTER — Encounter: Payer: Self-pay | Admitting: Hematology and Oncology

## 2022-11-25 ENCOUNTER — Ambulatory Visit: Payer: Medicare Other | Attending: Family Medicine | Admitting: Family Medicine

## 2022-11-25 DIAGNOSIS — R109 Unspecified abdominal pain: Secondary | ICD-10-CM

## 2022-11-25 DIAGNOSIS — J018 Other acute sinusitis: Secondary | ICD-10-CM

## 2022-11-25 DIAGNOSIS — R112 Nausea with vomiting, unspecified: Secondary | ICD-10-CM | POA: Diagnosis not present

## 2022-11-25 MED ORDER — METHOCARBAMOL 750 MG PO TABS
750.0000 mg | ORAL_TABLET | Freq: Three times a day (TID) | ORAL | 1 refills | Status: DC | PRN
  Filled 2022-11-25: qty 60, 20d supply, fill #0

## 2022-11-25 MED ORDER — AMOXICILLIN-POT CLAVULANATE 875-125 MG PO TABS
1.0000 | ORAL_TABLET | Freq: Two times a day (BID) | ORAL | 0 refills | Status: DC
  Filled 2022-11-25: qty 20, 10d supply, fill #0

## 2022-11-25 NOTE — Progress Notes (Signed)
Virtual Visit via Telephone Note  I connected with Casey [name], on 11/25/2022 at 8:26 AM by telephone and verified that I am speaking with the correct person using two identifiers.   Consent: I discussed the limitations, risks, security and privacy concerns of performing an evaluation and management service by telephone and the availability of in person appointments. I also discussed with the patient that there may be a patient responsible charge related to this service. The patient expressed understanding and agreed to proceed.   Location of Patient: Home  Location of Provider: Clinic   Persons participating in Telemedicine visit: Casey [name] Dr. Alvis Lemmings     History of Present Illness: Casey [name] is a 57 y.o. 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 Stomach seizes up for several hours of the day then resolves on its own. Pain is described as moderate.This occurs once to twice a month  He takes Zofran daily for nausea .  Was prescribed Bentyl by PCP which has been ineffective and he has been unable to keep this down. States he is not Lactose intolerant and has no problem with gluten. Sometimes has diarrhea but he also denies increase in oral intake.  CT abdomen and pelvis from 09/2022 revealed: IMPRESSION: 1. Today's study once again demonstrates progression of metastatic disease to the lungs, as evidenced by interval enlargement of all previously noted pulmonary nodules, as detailed above. No new pulmonary nodules are noted. 2. Borderline enlarged left hilar lymph node, likely metastatic. 3. No extra thoracic metastatic disease noted elsewhere in the chest, abdomen or pelvis. 4. Aortic atherosclerosis, in addition to left main and three-vessel coronary artery  disease. Please note that although the presence of coronary artery calcium documents the presence of coronary artery disease, the severity of this disease and any potential stenosis cannot be assessed on this non-gated CT examination. Assessment for potential risk factor modification, dietary therapy or pharmacologic therapy may be warranted, if clinically indicated. 5. Mild diffuse bronchial wall thickening with mild centrilobular and paraseptal emphysema; imaging findings suggestive of underlying COPD.   He has a cold x2 weeks and OTC medications have been ineffective.  He has no fever or myalgias. Endorses presence of nasal congestion which will not resolve. Past Medical History:  Diagnosis Date   Arthritis    Depression    Hypothyroidism    squamous cell ca of floor of mouth 12/2018   No Known Allergies  Current Outpatient Medications on File Prior to Visit  Medication Sig Dispense Refill   buPROPion (WELLBUTRIN XL) 150 MG 24 hr tablet Take 1 tablet by mouth daily. 60 tablet 1   chlorhexidine (PERIDEX) 0.12 % solution Rinse mouth with 20 mL then spit after use. Use once to twice daily. 473 mL 3   dexamethasone (DECADRON) 1 MG tablet Take no more than 1 tablet every other day, as needed for mouth swelling. 30 tablet 1   dicyclomine (BENTYL) 10 MG capsule Take 1 capsule (10 mg total) by mouth 3 (three) times daily as needed for spasms. 30 capsule 1   fluconazole (DIFLUCAN) 200 MG tablet Take 1 tablet (200 mg total) by mouth daily. 5 tablet 2   levothyroxine (SYNTHROID) 125 MCG tablet Take 1 tablet (125 mcg total) by mouth daily. 90 tablet 3   lidocaine (XYLOCAINE)  2 % solution MIX 1 PART 2% VISCOUS LIDOCAINE WITH 1 PART WATER. SWISH AND SPIT OF DILUTED MIXTURE TO NUMB MOUTH, UP TO 6 TIMES DAILY AS NEEDED 100 mL 4   lidocaine-prilocaine (EMLA) cream Apply 1 Application topically as needed. 30 g 0   magic mouthwash (lidocaine, diphenhydrAMINE, alum & mag hydroxide) suspension  Swish and spit 5 mls every 4 hours as needed (Patient not taking: Reported on 10/07/2022) 240 mL 1   Multiple Vitamin (MULTIVITAMIN) tablet Take 1 tablet by mouth daily.     ondansetron (ZOFRAN) 4 MG tablet Take 1 tablet (4 mg total) by mouth every 8 (eight) hours as needed for nausea or vomiting. 40 tablet 3   valACYclovir (VALTREX) 1000 MG tablet Take 1 tablet (1,000 mg total) by mouth 2 (two) times daily. 20 tablet 0   No current facility-administered medications on file prior to visit.    ROS: See HPI    Observations/Objective: Awake, alert, oriented x3 Not in acute distress Normal mood      Latest Ref Rng & Units 09/10/2022   10:31 AM 06/09/2022   11:59 AM 06/03/2022    1:16 PM  CMP  Glucose 70 - 99 mg/dL 84  83  409   BUN 6 - 20 mg/dL Creatinine 0.61 - 1.24 mg/dL 8.11  9.14  7.82   Sodium 135 - 145 mmol/L 136  134  135   Potassium 3.5 - 5.1 mmol/L 3.8  4.0  4.0   Chloride 98 - 111 mmol/L 99  100  102   CO2 22 - 32 mmol/L Calcium 8.9 - 10.3 mg/dL 9.3  8.9  9.1   Total Protein 6.5 - 8.1 g/dL 6.8  6.9  7.1   Total Bilirubin 0.3 - 1.2 mg/dL 0.4  0.3  0.3   Alkaline Phos 38 - 126 U/L 63  53  55   AST 15 - 41 U/L 40  23  21   ALT 0 - 44 U/L Lipid Panel     Component Value Date/Time   CHOL 255 (H) 09/17/2021 1511   TRIG 388 (H) 09/17/2021 1511   HDL 79 09/17/2021 1511   CHOLHDL 3.2 09/17/2021 1511   LDLCALC 110 (H) 09/17/2021 1511   LABVLDL 66 (H) 09/17/2021 1511    No results found for: "HGBA1C"  Assessment and Plan: 1. Other acute sinusitis, recurrence not specified Uncontrolled on OTC medications - amoxicillin-clavulanate (AUGMENTIN) 875-125 MG tablet; Take 1 tablet by mouth 2 (two) times daily.  Dispense: 20 tablet; Refill: 0  2. Abdominal spasms Uncontrolled on Bentyl He might benefit from gastric emptying study versus EGD - Ambulatory referral to Gastroenterology - methocarbamol (ROBAXIN-750) 750 MG tablet; Take 1  tablet (750 mg total) by mouth every 8 (eight) hours as needed for muscle spasms.  Dispense: 60 tablet; Refill: 1  3. Nausea and vomiting, unspecified vomiting type Uncontrolled Continue Zofran - Ambulatory referral to Gastroenterology   Follow Up Instructions: Keep previous to primary PCP   I discussed the assessment and treatment plan with the patient. The patient was provided an opportunity to ask questions and all were answered. The patient agreed with the plan and demonstrated an understanding of the instructions.   The patient was advised to call back or seek an in-person evaluation if the symptoms worsen or if the condition fails to improve as anticipated.  I provided 15 minutes total of non-face-to-face time during this encounter.   Hoy Register, MD, FAAFP. [facility] [place], [place] [phone]   11/25/2022, 8:26 AM

## 2022-11-25 NOTE — Telephone Encounter (Signed)
Patient does not want to reschedule new palliative care visit until he sees a GI specialist. Forwarding information to RN.

## 2022-11-26 ENCOUNTER — Encounter: Payer: Self-pay | Admitting: Hematology and Oncology

## 2022-11-26 ENCOUNTER — Encounter: Payer: Self-pay | Admitting: Adult Health

## 2022-11-26 ENCOUNTER — Other Ambulatory Visit (HOSPITAL_COMMUNITY): Payer: Self-pay

## 2022-12-03 ENCOUNTER — Telehealth: Payer: Self-pay | Admitting: Hematology and Oncology

## 2022-12-08 ENCOUNTER — Ambulatory Visit (HOSPITAL_COMMUNITY)
Admission: RE | Admit: 2022-12-08 | Discharge: 2022-12-08 | Disposition: A | Discharge status: Home / Self Care | Payer: Medicare Other | Source: Ambulatory Visit | Attending: Hematology and Oncology | Admitting: Hematology and Oncology

## 2022-12-08 DIAGNOSIS — C7800 Secondary malignant neoplasm of unspecified lung: Secondary | ICD-10-CM | POA: Diagnosis not present

## 2022-12-08 DIAGNOSIS — Z452 Encounter for adjustment and management of vascular access device: Secondary | ICD-10-CM | POA: Diagnosis not present

## 2022-12-08 HISTORY — PX: IR REMOVAL TUN ACCESS W/ PORT W/O FL MOD SED: IMG2290

## 2022-12-08 MED ORDER — LIDOCAINE-EPINEPHRINE 1 %-1:100000 IJ SOLN
INTRAMUSCULAR | Status: AC
  Filled 2022-12-08: qty 1

## 2022-12-08 MED ORDER — LIDOCAINE-EPINEPHRINE 1 %-1:100000 IJ SOLN
4.0000 mL | Freq: Once | INTRAMUSCULAR | Status: AC
  Administered 2022-12-08: 4 mL via INTRADERMAL

## 2022-12-14 NOTE — Progress Notes (Deleted)
Established Patient Office Visit  Subjective:  Patient ID: Casey [name], male    DOB: 03-05-1966  Age: 57 y.o. MRN: [id number]  CC: Primary care visit to establish  HPI Casey [name] presents for primary care f/u   08/13/21 Casey [name] presents for primary care to establish.  This patient originally was living in [facility] but then moved to be closer to his sister in June 2020.  Prior to this in the fall 2019 in [place] he developed sores in the base of his tongue with hoarseness and swallowing difficulty.  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  He was being recommended reconstructive surgery he did not wish to have this we moved to [place] to be with his sister in June 2020 in the [place] area.  It was that time he connected with otolaryngology Dr. Pollyann Kennedy.  He also connected with medical oncology and radiation oncology at [facility] health.  He has had extensive radiotherapy and chemotherapy treatments to the base of his tongue.  Multiple notes are in our [facility] health system which I have reviewed in detail.  See the [facility] health epic system for these notes.  Currently the patient's not undergoing treatment he has had ulcerations in the mouth it is slowly healed multiple biopsies taken of these were negative for recurrent cancer.  He did have a recent PET scan which shows nodules in the lung which are new and a pleural-based lesion as well.  These are being observed for now.  Note the patient now is edentulous and is looking for Medicaid excepting dentist for dentures.  Patient is still smoking 4 to 5 cigarettes daily and does use a 7 mg nicotine patch.   Is able to eat some meals at this time.  He has no other complaints.  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  He has had  extensive treatments of this documented at our last visit.  Patient seen today face-to-face.  He has developed a lesion on his lip which she is concerned about.  Is been there for several months and is not healing.  Patient also request a Medicaid dentist.  Patient also complains of occasional muscle spasticity in the stomach area.  Patient also needs colon cancer screening and is agreeable to Cologuard screening. Patient declined the flu vaccine.  He is agreeable to a pneumonia vaccination. Patient is no longer smoking cigarettes at this time and is using nicotine replacement The patient would like a dental examination to see if he is a candidate for dental implants The patient did successfully quit smoking using bupropion The patient has elevated TSH with previous radiation this needs to be further evaluated  02/03/22 The patient is experiencing increased nausea vomiting and episodes of diarrhea.  This seemed to occur when his dosage of levothyroxine was increased from 75 mcg to 100 mcg daily in May.  He still had elevated TSH levels and slightly low thyroid levels at that time.  However the patient simply not able to tolerate 100 mcg Synthroid dosing.  Symptoms worsened over the past week.  Patient was seen recently by oncology he has enlarging lung nodules they are recommending biopsy he wishes to hold off and observe for now.  Concern is that he has metastatic cancer  to the lung.  Patient is accompanied with his sister who also is advocating for the patient to hold off on biopsy of the lung nodules.  Patient's not taking any nutritional supplements is not eating well.  8/14 This patient seen in return follow-up and since the last visit he had a repeat CT scan of the chest in early July showing enlargement of both left and right upper lobe lung nodules consistent with metastatic disease.  Initially he was not going to have biopsies performed but now has been referred to pulmonary medicine and they  are going to perform on August 28 CT-guided super D imaging biopsies of the lung nodules.  Patient states he would like refills on his Zofran.  He states since reducing Synthroid to 75 mcg daily his side effects from the medication have resolved.  On arrival blood pressure initially elevated but on recheck was 123/78.  He is still smoking about 3 cigarettes daily.  There are no other complaints.  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  He took several cycles of chemotherapy but has not been on any since the end of last year.  There was a question of cardiac effects of the chemotherapy but all imaging studies today did not show heart failure or heart damage.  Patient is wishing to have potentially a second opinion on his treatment plan.  There is only 1 head and neck cancer specialist in the health system at this time.  Recent CT of the chest showed progression of pulmonary masses.  12/16/22  Past Medical History:  Diagnosis Date   Arthritis    Depression    Hypothyroidism    squamous cell ca of floor of mouth 12/2018    Past Surgical History:  Procedure Laterality Date   BRONCHIAL BIOPSY  03/31/2022   Procedure: BRONCHIAL BIOPSIES;  Surgeon: Leslye Peer, MD;  Location: [facility] ENDOSCOPY;  Service: Pulmonary;;   BRONCHIAL BRUSHINGS  03/31/2022   Procedure: BRONCHIAL BRUSHINGS;  Surgeon: Leslye Peer, MD;  Location: [facility] ENDOSCOPY;  Service: Pulmonary;;   BRONCHIAL NEEDLE ASPIRATION BIOPSY  03/31/2022   Procedure: BRONCHIAL NEEDLE ASPIRATION BIOPSIES;  Surgeon: Leslye Peer, MD;  Location: [facility] ENDOSCOPY;  Service: Pulmonary;;   EXCISION OF TONGUE LESION Left 09/17/2020   Procedure: Biopsy of Anterior Tongue;  Surgeon: Serena Colonel, MD;  Location: [facility];  Service: ENT;  Laterality: Left;   IR GASTROSTOMY TUBE MOD SED   04/03/2020   IR GASTROSTOMY TUBE REMOVAL  10/03/2020   IR IMAGING GUIDED PORT INSERTION  04/03/2020   IR IMAGING GUIDED PORT INSERTION  05/01/2022   IR REMOVAL TUN ACCESS W/ PORT W/O FL MOD SED  10/03/2020   IR REMOVAL TUN ACCESS W/ PORT W/O FL MOD SED  12/08/2022   VIDEO BRONCHOSCOPY WITH RADIAL ENDOBRONCHIAL ULTRASOUND  03/31/2022   Procedure: VIDEO BRONCHOSCOPY WITH RADIAL ENDOBRONCHIAL ULTRASOUND;  Surgeon: Leslye Peer, MD;  Location: [facility] ENDOSCOPY;  Service: Pulmonary;;    No family history on file.  Social History   Socioeconomic History   Marital status: Single    Spouse name: Not on file   Number of children: Not on file   Years of education: Not on file   Highest education level: Not on file  Occupational History   Not on file  Tobacco Use   Smoking status:  Every Day    Packs/day: .5    Types: Cigarettes   Smokeless tobacco: Never   Tobacco comments:    32 cigarettes smoked daily 04/17/22 [facility]   Vaping Use   Vaping Use: Never used  Substance and Sexual Activity   Alcohol use: Not Currently    Comment: a 12 pack of beer a week   Drug use: Never   Sexual activity: Not Currently  Other Topics Concern   Not on file  Social History Narrative   Not on file   Social Determinants of Health   Financial Resource Strain: Not on file  Food Insecurity: Not on file  Transportation Needs: Unmet Transportation Needs (07/17/2022)   PRAPARE - Administrator, Civil Service (Medical): Yes    Lack of Transportation (Non-Medical): Yes  Physical Activity: Not on file  Stress: Not on file  Social Connections: Not on file  Intimate Partner Violence: Not on file    Outpatient Medications Prior to Visit  Medication Sig Dispense Refill   amoxicillin-clavulanate (AUGMENTIN) 875-125 MG tablet Take 1 tablet by mouth 2 (two) times daily. 20 tablet 0   buPROPion (WELLBUTRIN XL) 150 MG 24 hr tablet Take 1 tablet by mouth daily. 60 tablet 1   chlorhexidine (PERIDEX) 0.12 % solution  Rinse mouth with 20 mL then spit after use. Use once to twice daily. 473 mL 3   dexamethasone (DECADRON) 1 MG tablet Take no more than 1 tablet every other day, as needed for mouth swelling. 30 tablet 1   dicyclomine (BENTYL) 10 MG capsule Take 1 capsule (10 mg total) by mouth 3 (three) times daily as needed for spasms. 30 capsule 1   fluconazole (DIFLUCAN) 200 MG tablet Take 1 tablet (200 mg total) by mouth daily. 5 tablet 2   levothyroxine (SYNTHROID) 125 MCG tablet Take 1 tablet (125 mcg total) by mouth daily. 90 tablet 3   lidocaine (XYLOCAINE) 2 % solution MIX 1 PART 2% VISCOUS LIDOCAINE WITH 1 PART WATER. SWISH AND SPIT OF DILUTED MIXTURE TO NUMB MOUTH, UP TO 6 TIMES DAILY AS NEEDED 100 mL 4   lidocaine-prilocaine (EMLA) cream Apply 1 Application topically as needed. 30 g 0   magic mouthwash (lidocaine, diphenhydrAMINE, alum & mag hydroxide) suspension Swish and spit 5 mls every 4 hours as needed (Patient not taking: Reported on 10/07/2022) 240 mL 1   methocarbamol (ROBAXIN-750) 750 MG tablet Take 1 tablet (750 mg total) by mouth every 8 (eight) hours as needed for muscle spasms. 60 tablet 1   Multiple Vitamin (MULTIVITAMIN) tablet Take 1 tablet by mouth daily.     ondansetron (ZOFRAN) 4 MG tablet Take 1 tablet (4 mg total) by mouth every 8 (eight) hours as needed for nausea or vomiting. 40 tablet 3   valACYclovir (VALTREX) 1000 MG tablet Take 1 tablet (1,000 mg total) by mouth 2 (two) times daily. 20 tablet 0   No facility-administered medications prior to visit.    No Known Allergies  ROS Review of Systems  Constitutional: Negative.  Negative for fever.  HENT:  Positive for trouble swallowing. Negative for ear pain, postnasal drip, rhinorrhea, sinus pressure, sore throat and voice change.   Eyes: Negative.   Respiratory: Negative.  Negative for apnea, cough, choking, chest tightness, shortness of breath, wheezing and stridor.   Cardiovascular: Negative.  Negative for chest pain,  palpitations and leg swelling.  Gastrointestinal:  Negative for abdominal distention, abdominal pain, diarrhea, nausea and vomiting.  Genitourinary: Negative.  Musculoskeletal: Negative.  Negative for arthralgias and myalgias.  Skin:  Negative for rash and wound.  Allergic/Immunologic: Negative.  Negative for environmental allergies and food allergies.  Neurological: Negative.  Negative for dizziness, syncope, weakness and headaches.  Hematological: Negative.  Negative for adenopathy. Does not bruise/bleed easily.  Psychiatric/Behavioral: Negative.  Negative for agitation, dysphoric mood, sleep disturbance and suicidal ideas. The patient is not nervous/anxious.       Objective:    Physical Exam Vitals reviewed.  Constitutional:      Appearance: He is well-developed. He is not diaphoretic.     Comments: Weight has stabilized now up to 118 pounds  HENT:     Head: Normocephalic and atraumatic.     Nose: Nose normal. No nasal deformity, septal deviation, mucosal edema or rhinorrhea.     Right Sinus: No maxillary sinus tenderness or frontal sinus tenderness.     Left Sinus: No maxillary sinus tenderness or frontal sinus tenderness.     Mouth/Throat:     Pharynx: No oropharyngeal exudate.  Eyes:     General: No scleral icterus.    Conjunctiva/sclera: Conjunctivae normal.     Pupils: Pupils are equal, round, and reactive to light.  Neck:     Thyroid: No thyromegaly.     Vascular: No carotid bruit or JVD.     Trachea: Trachea normal. No tracheal tenderness or tracheal deviation.     Comments: The tissue at the top of the neck under the jaw is firm as evidence from previous radiotherapy Cardiovascular:     Rate and Rhythm: Normal rate and regular rhythm.     Chest Wall: PMI is not displaced.     Pulses: Normal pulses. No decreased pulses.     Heart sounds: Normal heart sounds, S1 normal and S2 normal. Heart sounds not distant. No murmur heard.    No systolic murmur is present.     No  diastolic murmur is present.     No friction rub. No gallop. No S3 or S4 sounds.  Pulmonary:     Effort: No tachypnea, accessory muscle usage or respiratory distress.     Breath sounds: No stridor. No decreased breath sounds, wheezing, rhonchi or rales.  Chest:     Chest wall: No tenderness.  Abdominal:     General: Bowel sounds are normal. There is no distension.     Palpations: Abdomen is soft. Abdomen is not rigid.     Tenderness: There is no abdominal tenderness. There is no guarding or rebound.  Musculoskeletal:        General: Normal range of motion.     Cervical back: Normal range of motion. No edema, erythema or rigidity. No muscular tenderness. Normal range of motion.  Lymphadenopathy:     Head:     Right side of head: No submental or submandibular adenopathy.     Left side of head: No submental or submandibular adenopathy.     Cervical: No cervical adenopathy.  Skin:    General: Skin is warm and dry.     Coloration: Skin is not pale.     Findings: No rash.     Nails: There is no clubbing.  Neurological:     Mental Status: He is alert and oriented to person, place, and time.     Sensory: No sensory deficit.  Psychiatric:        Speech: Speech normal.        Behavior: Behavior normal.     There were no vitals  taken for this visit. Wt Readings from Last 3 Encounters:  10/07/22 117 lb 6.4 oz (53.3 kg)  09/16/22 115 lb 9.6 oz (52.4 kg)  06/11/22 115 lb (52.2 kg)     Health Maintenance Due  Topic Date Due   Medicare Annual Wellness (AWV)  Never done   DTaP/Tdap/Td (1 - Tdap) Never done   Zoster Vaccines- Shingrix (1 of 2) Never done   COVID-19 Vaccine (4 - 2023-24 season) 04/04/2022    There are no preventive care reminders to display for this patient.  Lab Results  Component Value Date   TSH 18.360 (H) 06/09/2022   Lab Results  Component Value Date   WBC 6.2 09/10/2022   HGB 12.1 (L) 09/10/2022   HCT 34.6 (L) 09/10/2022   MCV 96.1 09/10/2022   PLT 257  09/10/2022   Lab Results  Component Value Date   NA 136 09/10/2022   K 3.8 09/10/2022   CO2 31 09/10/2022   GLUCOSE 84 09/10/2022   BUN 7 09/10/2022   CREATININE 0.66 09/10/2022   BILITOT 0.4 09/10/2022   ALKPHOS 63 09/10/2022   AST 40 09/10/2022   ALT 28 09/10/2022   PROT 6.8 09/10/2022   ALBUMIN 4.1 09/10/2022   CALCIUM 9.3 09/10/2022   ANIONGAP 6 09/10/2022   EGFR 104 09/17/2021   Lab Results  Component Value Date   CHOL 255 (H) 09/17/2021   Lab Results  Component Value Date   HDL 79 09/17/2021   Lab Results  Component Value Date   LDLCALC 110 (H) 09/17/2021   Lab Results  Component Value Date   TRIG 388 (H) 09/17/2021   Lab Results  Component Value Date   CHOLHDL 3.2 09/17/2021   No results found for: "HGBA1C"    Assessment & Plan:   Problem List Items Addressed This Visit   None No orders of the defined types were placed in this encounter.  Follow-up: No follow-ups on file.    Shan Levans, MD

## 2022-12-15 ENCOUNTER — Telehealth: Payer: Self-pay

## 2022-12-15 NOTE — Telephone Encounter (Signed)
Copied from CRM [id number]. 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  CB#  [phone]

## 2022-12-16 ENCOUNTER — Ambulatory Visit: Payer: Self-pay | Admitting: Critical Care Medicine

## 2022-12-17 ENCOUNTER — Encounter: Payer: Self-pay | Admitting: Critical Care Medicine

## 2022-12-17 NOTE — Telephone Encounter (Signed)
Called patient and left voicemail.

## 2022-12-17 NOTE — Telephone Encounter (Signed)
I produced a letter to send to the patient

## 2022-12-18 ENCOUNTER — Other Ambulatory Visit: Payer: Self-pay

## 2022-12-18 ENCOUNTER — Other Ambulatory Visit (HOSPITAL_COMMUNITY): Payer: Self-pay

## 2022-12-19 ENCOUNTER — Other Ambulatory Visit (HOSPITAL_COMMUNITY): Payer: Self-pay

## 2022-12-23 ENCOUNTER — Ambulatory Visit: Payer: Self-pay | Admitting: Hematology and Oncology

## 2022-12-24 ENCOUNTER — Other Ambulatory Visit (HOSPITAL_COMMUNITY): Payer: Self-pay

## 2022-12-25 ENCOUNTER — Telehealth: Payer: Self-pay | Admitting: Hematology and Oncology

## 2022-12-25 NOTE — Telephone Encounter (Signed)
Spoke with patient confirming upcoming appointment  

## 2022-12-26 ENCOUNTER — Other Ambulatory Visit: Payer: Self-pay

## 2023-01-06 ENCOUNTER — Encounter: Payer: Self-pay | Admitting: Hematology and Oncology

## 2023-01-06 ENCOUNTER — Encounter: Payer: Self-pay | Admitting: Adult Health

## 2023-01-13 NOTE — Progress Notes (Unsigned)
Palliative Medicine [facility]  [phone] [phone]   Name: Casey [name] Date: 01/13/2023 MRN: [id number]  DOB: [birth year]  Patient Care Team: Storm Frisk, MD as PCP - General (Pulmonary Disease) Malmfelt, Lise Auer, RN as Oncology Nurse Navigator Lonie Peak, MD as Attending Physician (Radiation Oncology) Barron Alvine, CCC-SLP as Speech Language Pathologist (Speech Pathology) Jeanella Craze, Remi Deter, PT as Physical Therapist (Physical Therapy) Anabel Bene, RD as Dietitian (Nutrition) Anabel Bene, RD as Dietitian (Nutrition) Sallee Lange, LCSW (Inactive) as Social Worker (General Practice) Rachel Moulds, MD as Consulting Physician (Hematology and Oncology)    REASON FOR CONSULTATION: Casey [name] is a 57 y.o. 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 Palliative ask to see for symptom management and goals of care.    SOCIAL HISTORY:     reports that he has been smoking cigarettes. He has been smoking an average of .5 packs per day. He has never used smokeless tobacco. He reports that he does not currently use alcohol. He reports that he does not use drugs.  ADVANCE DIRECTIVES:  None on file  CODE STATUS:   PAST MEDICAL HISTORY: Past Medical History:  Diagnosis Date   Arthritis    Depression    Hypothyroidism    squamous cell ca of floor of mouth 12/2018    PAST SURGICAL HISTORY:  Past Surgical History:  Procedure Laterality Date   BRONCHIAL BIOPSY  03/31/2022   Procedure: BRONCHIAL BIOPSIES;  Surgeon: Leslye Peer, MD;  Location: [facility];  Service: Pulmonary;;   BRONCHIAL BRUSHINGS  03/31/2022   Procedure: BRONCHIAL BRUSHINGS;  Surgeon: Leslye Peer, MD;  Location: [facility];  Service: Pulmonary;;   BRONCHIAL NEEDLE ASPIRATION BIOPSY  03/31/2022    Procedure: BRONCHIAL NEEDLE ASPIRATION BIOPSIES;  Surgeon: Leslye Peer, MD;  Location: [facility];  Service: Pulmonary;;   EXCISION OF TONGUE LESION Left 09/17/2020   Procedure: Biopsy of Anterior Tongue;  Surgeon: Serena Colonel, MD;  Location: [facility];  Service: ENT;  Laterality: Left;   IR GASTROSTOMY TUBE MOD SED  04/03/2020   IR GASTROSTOMY TUBE REMOVAL  10/03/2020   IR IMAGING GUIDED PORT INSERTION  04/03/2020   IR IMAGING GUIDED PORT INSERTION  05/01/2022   IR REMOVAL TUN ACCESS W/ PORT W/O FL MOD SED  10/03/2020   IR REMOVAL TUN ACCESS W/ PORT W/O FL MOD SED  12/08/2022   VIDEO BRONCHOSCOPY WITH RADIAL ENDOBRONCHIAL ULTRASOUND  03/31/2022   Procedure: VIDEO BRONCHOSCOPY WITH RADIAL ENDOBRONCHIAL ULTRASOUND;  Surgeon: Leslye Peer, MD;  Location: [facility];  Service: Pulmonary;;    HEMATOLOGY/ONCOLOGY HISTORY:  Oncology History  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 CT maxillofacial on 12/22/19 showed postsurgical changes and mandibular invasion, with cervical lymphadenopathy consistent with metastatic nodes. PET scan on 02/16/20 showed the tongue mass with lymph node metastases on the right floor of the mouth and cervical lymph nodes.   03/07/2020 Cancer Staging   Staging form: Oral Cavity, AJCC 8th Edition - Clinical: Stage IVA (cT4a, cN2b, cM0) - Signed by Serena Croissant, MD on 03/07/2020   04/04/2020 - 05/23/2020 Radiation Therapy  Site Technique Total Dose (Gy) Dose per Fx (Gy) Completed Fx Beam Energies  Tongue: HN_tongFOM IMRT 70/70 2 35/35 6X    04/06/2020 - 05/17/2020 Chemotherapy   Patient is on Treatment Plan : HEAD/NECK Cisplatin + XRT q21d     02/06/2022 Imaging   IMPRESSION: 1. Progressive metastatic disease  to the lungs as evidenced by increased size of numerous pre-existing pulmonary nodules, as above. No new pulmonary nodules are otherwise noted. No extra pulmonary metastatic disease noted elsewhere in the abdomen or pelvis. 2. Aortic atherosclerosis, in addition to left main and three-vessel coronary artery disease. Please note that although the presence of coronary artery calcium documents the presence of coronary artery disease, the severity of this disease and any potential stenosis cannot be assessed on this non-gated CT examination. Assessment for potential risk factor modification, dietary therapy or pharmacologic therapy may be warranted, if clinically indicated. 3. Mild diffuse bronchial wall thickening with mild centrilobular and paraseptal emphysema; imaging findings suggestive of underlying COPD.     Electronically Signed   By: Trudie Reed M.D.   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 Progressive metastatic disease to the lungs as evidenced by increased size of numerous pre-existing pulmonary nodules, as above. No new pulmonary nodules are otherwise noted. No extra pulmonary metastatic disease noted elsewhere in the abdomen or pelvis. 2. Aortic atherosclerosis, in addition to left main and three-vessel coronary artery disease. Please note that although the presence of coronary artery calcium documents the presence of coronary artery disease, the severity of this disease and any potential stenosis cannot be assessed on this non-gated CT examination. Assessment for potential risk factor  modification, dietary therapy or pharmacologic therapy may be warranted, if clinically indicated. 3. Mild diffuse bronchial wall thickening with mild centrilobular and paraseptal emphysema; imaging findings suggestive of underlying COPD.     Electronically Signed   By: Trudie Reed M.D.   On: 02/06/2022 08:46   03/31/2022 Initial Biopsy   Bronchoscopy LUNG, LUL, NEEDLE  BIOPSIES:  - Malignant cells consistent with squamous cell carcinoma   LUNG, LUL, BRUSHING:  - Malignant cells consistent with squamous cell carcinoma  PDL 0%, negative   05/19/2022 -  Chemotherapy   Patient is on Treatment Plan : HEAD/NECK Pembrolizumab (200) D1 + Carboplatin (5) D1 + 5FU (1000) IVCI D1-4 q21d x 6 cycles / Pembrolizumab (200) D1 q21d       ALLERGIES:  has No Known Allergies.  MEDICATIONS:  Current Outpatient Medications  Medication Sig Dispense Refill   amoxicillin-clavulanate (AUGMENTIN) 875-125 MG tablet Take 1 tablet by mouth 2 (two) times daily. 20 tablet 0   buPROPion (WELLBUTRIN XL) 150 MG 24 hr tablet Take 1 tablet by mouth daily. 60 tablet 1   chlorhexidine (PERIDEX) 0.12 % solution Rinse mouth with 20 mL then spit after use. Use once to twice daily. 473 mL 3   dexamethasone (DECADRON) 1 MG tablet Take no more than 1 tablet every other day, as needed for mouth swelling. 30 tablet 1   dicyclomine (BENTYL) 10 MG capsule Take 1 capsule (10 mg total) by mouth 3 (three) times daily as  needed for spasms. 30 capsule 1   fluconazole (DIFLUCAN) 200 MG tablet Take 1 tablet (200 mg total) by mouth daily. 5 tablet 2   levothyroxine (SYNTHROID) 125 MCG tablet Take 1 tablet (125 mcg total) by mouth daily. 90 tablet 3   lidocaine (XYLOCAINE) 2 % solution MIX 1 PART 2% VISCOUS LIDOCAINE WITH 1 PART WATER. SWISH AND SPIT OF DILUTED MIXTURE TO NUMB MOUTH, UP TO 6 TIMES DAILY AS NEEDED 100 mL 4   lidocaine-prilocaine (EMLA) cream Apply 1 Application topically as needed. 30 g 0   magic mouthwash  (lidocaine, diphenhydrAMINE, alum & mag hydroxide) suspension Swish and spit 5 mls every 4 hours as needed (Patient not taking: Reported on 10/07/2022) 240 mL 1   methocarbamol (ROBAXIN-750) 750 MG tablet Take 1 tablet (750 mg total) by mouth every 8 (eight) hours as needed for muscle spasms. 60 tablet 1   Multiple Vitamin (MULTIVITAMIN) tablet Take 1 tablet by mouth daily.     ondansetron (ZOFRAN) 4 MG tablet Take 1 tablet (4 mg total) by mouth every 8 (eight) hours as needed for nausea or vomiting. 40 tablet 3   valACYclovir (VALTREX) 1000 MG tablet Take 1 tablet (1,000 mg total) by mouth 2 (two) times daily. 20 tablet 0   No current facility-administered medications for this visit.    VITAL SIGNS: There were no vitals taken for this visit. There were no vitals filed for this visit.  Estimated body mass index is 20.8 kg/m as calculated from the following:   Height as of 09/16/22: 5\' 3"  (1.6 m).   Weight as of 10/07/22: 117 lb 6.4 oz (53.3 kg).  LABS: CBC:    Component Value Date/Time   WBC 6.2 09/10/2022 1031   WBC 3.4 (L) 06/03/2022 1316   HGB 12.1 (L) 09/10/2022 1031   HGB 13.0 09/17/2021 1511   HCT 34.6 (L) 09/10/2022 1031   HCT 37.2 (L) 09/17/2021 1511   PLT 257 09/10/2022 1031   PLT 266 09/17/2021 1511   MCV 96.1 09/10/2022 1031   MCV 100 (H) 09/17/2021 1511   NEUTROABS 4.5 09/10/2022 1031   NEUTROABS 4.2 09/17/2021 1511   LYMPHSABS 0.8 09/10/2022 1031   LYMPHSABS 1.3 09/17/2021 1511   MONOABS 0.8 09/10/2022 1031   EOSABS 0.1 09/10/2022 1031   EOSABS 0.2 09/17/2021 1511   BASOSABS 0.1 09/10/2022 1031   BASOSABS 0.1 09/17/2021 1511   Comprehensive Metabolic Panel:    Component Value Date/Time   NA 136 09/10/2022 1031   NA 136 09/17/2021 1511   K 3.8 09/10/2022 1031   CL 99 09/10/2022 1031   CO2 31 09/10/2022 1031   BUN 7 09/10/2022 1031   BUN 9 09/17/2021 1511   CREATININE 0.66 09/10/2022 1031   GLUCOSE 84 09/10/2022 1031   CALCIUM 9.3 09/10/2022 1031   AST 40  09/10/2022 1031   ALT 28 09/10/2022 1031   ALKPHOS 63 09/10/2022 1031   BILITOT 0.4 09/10/2022 1031   PROT 6.8 09/10/2022 1031   PROT 7.5 09/17/2021 1511   ALBUMIN 4.1 09/10/2022 1031   ALBUMIN 5.0 (H) 09/17/2021 1511    RADIOGRAPHIC STUDIES: No results found.  PERFORMANCE STATUS (ECOG) : {CHL ONC ECOG ZO:[id number]}  Review of Systems Unless otherwise noted, a complete review of systems is negative.  Physical Exam General: NAD Cardiovascular: regular rate and rhythm Pulmonary: clear ant fields Abdomen: soft, nontender, + bowel sounds Extremities: no edema, no joint deformities Skin: no rashes Neurological:  IMPRESSION: *** I introduced myself, Keedan Sample RN,  and Palliative's role in collaboration with the oncology team. Concept of Palliative Care was introduced as specialized medical care for people and their families living with serious illness.  It focuses on providing relief from the symptoms and stress of a serious illness.  The goal is to improve quality of life for both the patient and the family. Values and goals of care important to patient and family were attempted to be elicited.    We discussed *** current illness and what it means in the larger context of *** on-going co-morbidities. Natural disease trajectory and expectations were discussed.  I discussed the importance of continued conversation with family and their medical providers regarding overall plan of care and treatment options, ensuring decisions are within the context of the patients values and GOCs.  PLAN: Established therapeutic relationship. Education provided on palliative's role in collaboration with their Oncology/Radiation team. I will plan to see patient back in 2-4 weeks in collaboration to other oncology appointments.    Patient expressed understanding and was in agreement with this plan. He also understands that He can call the clinic at any time with any questions, concerns, or complaints.    Thank you for your referral and allowing Palliative to assist in Mr. Andee Lineman Gillooly's care.   Number and complexity of problems addressed: ***HIGH - 1 or more chronic illnesses with SEVERE exacerbation, progression, or side effects of treatment - advanced cancer, pain. Any controlled substances utilized were prescribed in the context of palliative care.   Visit consisted of counseling and education dealing with the complex and emotionally intense issues of symptom management and palliative care in the setting of serious and potentially life-threatening illness.Greater than 50%  of this time was spent counseling and coordinating care related to the above assessment and plan.  Signed by: Willette Alma, AGPCNP-BC Palliative Medicine Team/[facility]   *Please note that this is a verbal dictation therefore any spelling or grammatical errors are due to the "[employer]" system interpretation.

## 2023-01-14 ENCOUNTER — Inpatient Hospital Stay: Payer: Medicare HMO | Attending: Nurse Practitioner | Admitting: Nurse Practitioner

## 2023-01-18 ENCOUNTER — Other Ambulatory Visit: Payer: Self-pay

## 2023-01-19 ENCOUNTER — Other Ambulatory Visit: Payer: Self-pay | Admitting: Critical Care Medicine

## 2023-01-19 ENCOUNTER — Other Ambulatory Visit: Payer: Self-pay

## 2023-01-19 ENCOUNTER — Other Ambulatory Visit (HOSPITAL_COMMUNITY): Payer: Self-pay

## 2023-01-19 ENCOUNTER — Encounter: Payer: Self-pay | Admitting: Hematology and Oncology

## 2023-01-19 ENCOUNTER — Encounter: Payer: Self-pay | Admitting: Adult Health

## 2023-01-19 DIAGNOSIS — C049 Malignant neoplasm of floor of mouth, unspecified: Secondary | ICD-10-CM

## 2023-01-19 MED ORDER — FLUCONAZOLE 200 MG PO TABS
200.0000 mg | ORAL_TABLET | Freq: Every day | ORAL | 2 refills | Status: DC
  Filled 2023-01-19: qty 5, 5d supply, fill #0
  Filled 2023-02-02: qty 5, 5d supply, fill #1
  Filled 2023-03-02 – 2023-03-05 (×2): qty 5, 5d supply, fill #2

## 2023-01-24 ENCOUNTER — Other Ambulatory Visit (HOSPITAL_COMMUNITY): Payer: Self-pay

## 2023-02-02 ENCOUNTER — Other Ambulatory Visit (HOSPITAL_COMMUNITY): Payer: Self-pay

## 2023-02-02 ENCOUNTER — Other Ambulatory Visit: Payer: Self-pay

## 2023-02-19 ENCOUNTER — Ambulatory Visit: Payer: Self-pay | Admitting: *Deleted

## 2023-02-19 DIAGNOSIS — C049 Malignant neoplasm of floor of mouth, unspecified: Secondary | ICD-10-CM

## 2023-02-19 NOTE — Telephone Encounter (Signed)
Summary: requesting a dose increase   Pt is requesting a dose increase on the medication ondansetron (ZOFRAN) 4 MG tablet. Stated is taking it three times a day, and it doesn't seem to work as well as it used to.  Seeking clinical advice.          Called patient [phone] to review medication request. No answer from patient, LVMTCB [phone].

## 2023-02-19 NOTE — Telephone Encounter (Signed)
  Chief Complaint: requesting increase in dose of zofran 4 mg . Current dose not as effective Symptoms: worsening difficulty tolerating food and liquids. Vomiting a couple of times since Sunday  Frequency: Sunday  Pertinent Negatives: Patient denies weakness or dizziness Disposition: [] ED /[] Urgent Care (no appt availability in office) / [] Appointment(In office/virtual)/ []  [facility]/ [] Home Care/ [] Refused Recommended Disposition /[] [facility]/ [x]  Follow-up with PCP Additional Notes:   Requesting increase in dose of zofran . Please advise.    Reason for Disposition  [1] Caller has NON-URGENT medicine question about med that PCP prescribed AND [2] triager unable to answer question  Answer Assessment - Initial Assessment Questions 1. NAME of MEDICINE: "What medicine(s) are you calling about?"     Zofran 4 mg  2. QUESTION: "What is your question?" (e.g., double dose of medicine, side effect)     Can dose be increased due to not as effective as before ? 3. PRESCRIBER: "Who prescribed the medicine?" Reason: if prescribed by specialist, call should be referred to that group.     PCP 4. SYMPTOMS: "Do you have any symptoms?" If Yes, ask: "What symptoms are you having?"  "How bad are the symptoms (e.g., mild, moderate, severe)     Unable to tolerate food or liquids at times. Vomiting couple of times.  5. PREGNANCY:  "Is there any chance that you are pregnant?" "When was your last menstrual period?"     na  Protocols used: Medication Question Call-A-AH

## 2023-02-20 ENCOUNTER — Other Ambulatory Visit: Payer: Self-pay

## 2023-02-20 MED ORDER — ONDANSETRON HCL 8 MG PO TABS
4.0000 mg | ORAL_TABLET | Freq: Three times a day (TID) | ORAL | 1 refills | Status: DC | PRN
  Filled 2023-02-20: qty 30, 20d supply, fill #0
  Filled 2023-03-20: qty 30, 20d supply, fill #1

## 2023-02-20 NOTE — Telephone Encounter (Signed)
Dosing changed.

## 2023-02-20 NOTE — Telephone Encounter (Signed)
Called patient and he is aware. 

## 2023-02-23 ENCOUNTER — Other Ambulatory Visit: Payer: Self-pay

## 2023-02-23 NOTE — Progress Notes (Deleted)
Established Patient Office Visit  Subjective:  Patient ID: Wilkin Lippy, male    DOB: 1965/08/10  Age: 57 y.o. MRN: [id number]  CC: Primary care visit to establish  HPI Davi Kroon Bowe presents for primary care f/u   08/13/21 Andee Lineman Whittlesey presents for primary care to establish.  This patient originally was living in [facility] but then moved to be closer to his sister in June 2020.  Prior to this in the fall 2019 in [place] he developed sores in the base of his tongue with hoarseness and swallowing difficulty.  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  He was being recommended reconstructive surgery he did not wish to have this we moved to [place] to be with his sister in June 2020 in the [place] area.  It was that time he connected with otolaryngology Dr. Pollyann Kennedy.  He also connected with medical oncology and radiation oncology at [facility] health.  He has had extensive radiotherapy and chemotherapy treatments to the base of his tongue.  Multiple notes are in our [facility] health system which I have reviewed in detail.  See the [facility] health epic system for these notes.  Currently the patient's not undergoing treatment he has had ulcerations in the mouth it is slowly healed multiple biopsies taken of these were negative for recurrent cancer.  He did have a recent PET scan which shows nodules in the lung which are new and a pleural-based lesion as well.  These are being observed for now.  Note the patient now is edentulous and is looking for Medicaid excepting dentist for dentures.  Patient is still smoking 4 to 5 cigarettes daily and does use a 7 mg nicotine patch.   Is able to eat some meals at this time.  He has no other complaints.  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  He has had  extensive treatments of this documented at our last visit.  Patient seen today face-to-face.  He has developed a lesion on his lip which she is concerned about.  Is been there for several months and is not healing.  Patient also request a Medicaid dentist.  Patient also complains of occasional muscle spasticity in the stomach area.  Patient also needs colon cancer screening and is agreeable to Cologuard screening. Patient declined the flu vaccine.  He is agreeable to a pneumonia vaccination. Patient is no longer smoking cigarettes at this time and is using nicotine replacement The patient would like a dental examination to see if he is a candidate for dental implants The patient did successfully quit smoking using bupropion The patient has elevated TSH with previous radiation this needs to be further evaluated  02/03/22 The patient is experiencing increased nausea vomiting and episodes of diarrhea.  This seemed to occur when his dosage of levothyroxine was increased from 75 mcg to 100 mcg daily in May.  He still had elevated TSH levels and slightly low thyroid levels at that time.  However the patient simply not able to tolerate 100 mcg Synthroid dosing.  Symptoms worsened over the past week.  Patient was seen recently by oncology he has enlarging lung nodules they are recommending biopsy he wishes to hold off and observe for now.  Concern is that he has metastatic cancer  to the lung.  Patient is accompanied with his sister who also is advocating for the patient to hold off on biopsy of the lung nodules.  Patient's not taking any nutritional supplements is not eating well.  8/14 This patient seen in return follow-up and since the last visit he had a repeat CT scan of the chest in early July showing enlargement of both left and right upper lobe lung nodules consistent with metastatic disease.  Initially he was not going to have biopsies performed but now has been referred to pulmonary medicine and they  are going to perform on August 28 CT-guided super D imaging biopsies of the lung nodules.  Patient states he would like refills on his Zofran.  He states since reducing Synthroid to 75 mcg daily his side effects from the medication have resolved.  On arrival blood pressure initially elevated but on recheck was 123/78.  He is still smoking about 3 cigarettes daily.  There are no other complaints.  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  He took several cycles of chemotherapy but has not been on any since the end of last year.  There was a question of cardiac effects of the chemotherapy but all imaging studies today did not show heart failure or heart damage.  Patient is wishing to have potentially a second opinion on his treatment plan.  There is only 1 head and neck cancer specialist in the health system at this time.  Recent CT of the chest showed progression of pulmonary masses. Past Medical History:  Diagnosis Date   Arthritis    Depression    Hypothyroidism    squamous cell ca of floor of mouth 12/2018    Past Surgical History:  Procedure Laterality Date   BRONCHIAL BIOPSY  03/31/2022   Procedure: BRONCHIAL BIOPSIES;  Surgeon: Leslye Peer, MD;  Location: [facility];  Service: Pulmonary;;   BRONCHIAL BRUSHINGS  03/31/2022   Procedure: BRONCHIAL BRUSHINGS;  Surgeon: Leslye Peer, MD;  Location: [facility];  Service: Pulmonary;;   BRONCHIAL NEEDLE ASPIRATION BIOPSY  03/31/2022   Procedure: BRONCHIAL NEEDLE ASPIRATION BIOPSIES;  Surgeon: Leslye Peer, MD;  Location: [facility];  Service: Pulmonary;;   EXCISION OF TONGUE LESION Left 09/17/2020   Procedure: Biopsy of Anterior Tongue;  Surgeon: Serena Colonel, MD;  Location: [facility];  Service: ENT;  Laterality: Left;   IR GASTROSTOMY TUBE MOD SED  04/03/2020   IR  GASTROSTOMY TUBE REMOVAL  10/03/2020   IR IMAGING GUIDED PORT INSERTION  04/03/2020   IR IMAGING GUIDED PORT INSERTION  05/01/2022   IR REMOVAL TUN ACCESS W/ PORT W/O FL MOD SED  10/03/2020   IR REMOVAL TUN ACCESS W/ PORT W/O FL MOD SED  12/08/2022   VIDEO BRONCHOSCOPY WITH RADIAL ENDOBRONCHIAL ULTRASOUND  03/31/2022   Procedure: VIDEO BRONCHOSCOPY WITH RADIAL ENDOBRONCHIAL ULTRASOUND;  Surgeon: Leslye Peer, MD;  Location: [facility];  Service: Pulmonary;;    No family history on file.  Social History   Socioeconomic History   Marital status: Single    Spouse name: Not on file   Number of children: Not on file   Years of education: Not on file   Highest education level: Not on file  Occupational History   Not on file  Tobacco Use   Smoking status: Every Day  Current packs/day: 0.50    Types: Cigarettes   Smokeless tobacco: Never   Tobacco comments:    32 cigarettes smoked daily 04/17/22 [facility]   Vaping Use   Vaping status: Never Used  Substance and Sexual Activity   Alcohol use: Not Currently    Comment: a 12 pack of beer a week   Drug use: Never   Sexual activity: Not Currently  Other Topics Concern   Not on file  Social History Narrative   Not on file   Social Determinants of Health   Financial Resource Strain: Not on file  Food Insecurity: No Food Insecurity (11/01/2020)   [facility] from [facility]   Hunger Vital Sign    Worried About Running Out of Food in the Last Year: Never true    Ran Out of Food in the Last Year: Never true  Transportation Needs: Unmet Transportation Needs (07/17/2022)   PRAPARE - Administrator, Civil Service (Medical): Yes    Lack of Transportation (Non-Medical): Yes  Physical Activity: Not on file  Stress: Not on file  Social Connections: Unknown (12/17/2021)   Received from [facility]   Social Network    Social Network: Not on file  Intimate Partner Violence: Unknown (11/08/2021)   Received from [facility]   HITS     Physically Hurt: Not on file    Insult or Talk Down To: Not on file    Threaten Physical Harm: Not on file    Scream or Curse: Not on file    Outpatient Medications Prior to Visit  Medication Sig Dispense Refill   amoxicillin-clavulanate (AUGMENTIN) 875-125 MG tablet Take 1 tablet by mouth 2 (two) times daily. 20 tablet 0   buPROPion (WELLBUTRIN XL) 150 MG 24 hr tablet Take 1 tablet by mouth daily. 60 tablet 1   chlorhexidine (PERIDEX) 0.12 % solution Rinse mouth with 20 mL then spit after use. Use once to twice daily. 473 mL 3   dexamethasone (DECADRON) 1 MG tablet Take no more than 1 tablet every other day, as needed for mouth swelling. 30 tablet 1   dicyclomine (BENTYL) 10 MG capsule Take 1 capsule (10 mg total) by mouth 3 (three) times daily as needed for spasms. 30 capsule 1   fluconazole (DIFLUCAN) 200 MG tablet Take 1 tablet (200 mg total) by mouth daily. 5 tablet 2   levothyroxine (SYNTHROID) 125 MCG tablet Take 1 tablet (125 mcg total) by mouth daily. 90 tablet 3   lidocaine (XYLOCAINE) 2 % solution MIX 1 PART 2% VISCOUS LIDOCAINE WITH 1 PART WATER. SWISH AND SPIT OF DILUTED MIXTURE TO NUMB MOUTH, UP TO 6 TIMES DAILY AS NEEDED 100 mL 4   lidocaine-prilocaine (EMLA) cream Apply 1 Application topically as needed. 30 g 0   magic mouthwash (lidocaine, diphenhydrAMINE, alum & mag hydroxide) suspension Swish and spit 5 mls every 4 hours as needed (Patient not taking: Reported on 10/07/2022) 240 mL 1   methocarbamol (ROBAXIN-750) 750 MG tablet Take 1 tablet (750 mg total) by mouth every 8 (eight) hours as needed for muscle spasms. 60 tablet 1   Multiple Vitamin (MULTIVITAMIN) tablet Take 1 tablet by mouth daily.     ondansetron (ZOFRAN) 8 MG tablet Take 0.5 tablets (4 mg total) by mouth every 8 (eight) hours as needed for nausea or vomiting. 30 tablet 1   valACYclovir (VALTREX) 1000 MG tablet Take 1 tablet (1,000 mg total) by mouth 2 (two) times daily. 20 tablet 0   No  facility-administered medications prior to visit.    No Known Allergies  ROS Review of Systems  Constitutional: Negative.  Negative for fever.  HENT:  Positive for trouble swallowing. Negative for ear pain, postnasal drip, rhinorrhea, sinus pressure, sore throat and voice change.   Eyes: Negative.   Respiratory: Negative.  Negative for apnea, cough, choking, chest tightness, shortness of breath, wheezing and stridor.   Cardiovascular: Negative.  Negative for chest pain, palpitations and leg swelling.  Gastrointestinal:  Negative for abdominal distention, abdominal pain, diarrhea, nausea and vomiting.  Genitourinary: Negative.   Musculoskeletal: Negative.  Negative for arthralgias and myalgias.  Skin:  Negative for rash and wound.  Allergic/Immunologic: Negative.  Negative for environmental allergies and food allergies.  Neurological: Negative.  Negative for dizziness, syncope, weakness and headaches.  Hematological: Negative.  Negative for adenopathy. Does not bruise/bleed easily.  Psychiatric/Behavioral: Negative.  Negative for agitation, dysphoric mood, sleep disturbance and suicidal ideas. The patient is not nervous/anxious.       Objective:    Physical Exam Vitals reviewed.  Constitutional:      Appearance: He is well-developed. He is not diaphoretic.     Comments: Weight has stabilized now up to 118 pounds  HENT:     Head: Normocephalic and atraumatic.     Nose: Nose normal. No nasal deformity, septal deviation, mucosal edema or rhinorrhea.     Right Sinus: No maxillary sinus tenderness or frontal sinus tenderness.     Left Sinus: No maxillary sinus tenderness or frontal sinus tenderness.     Mouth/Throat:     Pharynx: No oropharyngeal exudate.  Eyes:     General: No scleral icterus.    Conjunctiva/sclera: Conjunctivae normal.     Pupils: Pupils are equal, round, and reactive to light.  Neck:     Thyroid: No thyromegaly.     Vascular: No carotid bruit or JVD.      Trachea: Trachea normal. No tracheal tenderness or tracheal deviation.     Comments: The tissue at the top of the neck under the jaw is firm as evidence from previous radiotherapy Cardiovascular:     Rate and Rhythm: Normal rate and regular rhythm.     Chest Wall: PMI is not displaced.     Pulses: Normal pulses. No decreased pulses.     Heart sounds: Normal heart sounds, S1 normal and S2 normal. Heart sounds not distant. No murmur heard.    No systolic murmur is present.     No diastolic murmur is present.     No friction rub. No gallop. No S3 or S4 sounds.  Pulmonary:     Effort: No tachypnea, accessory muscle usage or respiratory distress.     Breath sounds: No stridor. No decreased breath sounds, wheezing, rhonchi or rales.  Chest:     Chest wall: No tenderness.  Abdominal:     General: Bowel sounds are normal. There is no distension.     Palpations: Abdomen is soft. Abdomen is not rigid.     Tenderness: There is no abdominal tenderness. There is no guarding or rebound.  Musculoskeletal:        General: Normal range of motion.     Cervical back: Normal range of motion. No edema, erythema or rigidity. No muscular tenderness. Normal range of motion.  Lymphadenopathy:     Head:     Right side of head: No submental or submandibular adenopathy.     Left side of head: No submental or submandibular adenopathy.  Cervical: No cervical adenopathy.  Skin:    General: Skin is warm and dry.     Coloration: Skin is not pale.     Findings: No rash.     Nails: There is no clubbing.  Neurological:     Mental Status: He is alert and oriented to person, place, and time.     Sensory: No sensory deficit.  Psychiatric:        Speech: Speech normal.        Behavior: Behavior normal.     There were no vitals taken for this visit. Wt Readings from Last 3 Encounters:  10/07/22 117 lb 6.4 oz (53.3 kg)  09/16/22 115 lb 9.6 oz (52.4 kg)  06/11/22 115 lb (52.2 kg)     Health Maintenance Due   Topic Date Due   Medicare Annual Wellness (AWV)  Never done   DTaP/Tdap/Td (1 - Tdap) Never done   Zoster Vaccines- Shingrix (1 of 2) Never done   COVID-19 Vaccine (4 - 2023-24 season) 04/04/2022    There are no preventive care reminders to display for this patient.  Lab Results  Component Value Date   TSH 18.360 (H) 06/09/2022   Lab Results  Component Value Date   WBC 6.2 09/10/2022   HGB 12.1 (L) 09/10/2022   HCT 34.6 (L) 09/10/2022   MCV 96.1 09/10/2022   PLT 257 09/10/2022   Lab Results  Component Value Date   NA 136 09/10/2022   K 3.8 09/10/2022   CO2 31 09/10/2022   GLUCOSE 84 09/10/2022   BUN 7 09/10/2022   CREATININE 0.66 09/10/2022   BILITOT 0.4 09/10/2022   ALKPHOS 63 09/10/2022   AST 40 09/10/2022   ALT 28 09/10/2022   PROT 6.8 09/10/2022   ALBUMIN 4.1 09/10/2022   CALCIUM 9.3 09/10/2022   ANIONGAP 6 09/10/2022   EGFR 104 09/17/2021   Lab Results  Component Value Date   CHOL 255 (H) 09/17/2021   Lab Results  Component Value Date   HDL 79 09/17/2021   Lab Results  Component Value Date   LDLCALC 110 (H) 09/17/2021   Lab Results  Component Value Date   TRIG 388 (H) 09/17/2021   Lab Results  Component Value Date   CHOLHDL 3.2 09/17/2021   No results found for: "HGBA1C"    Assessment & Plan:   Problem List Items Addressed This Visit   None   No orders of the defined types were placed in this encounter.  Follow-up: No follow-ups on file.    Shan Levans, MD HPI Review of Systems  Constitutional: Negative.  Negative for fever.  HENT:  Positive for trouble swallowing. Negative for ear pain, postnasal drip, rhinorrhea, sinus pressure, sore throat and voice change.   Eyes: Negative.   Respiratory: Negative.  Negative for apnea, cough, choking, chest tightness, shortness of breath, wheezing and stridor.   Cardiovascular: Negative.  Negative for chest pain, palpitations and leg swelling.  Gastrointestinal:  Negative for abdominal  distention, abdominal pain, diarrhea, nausea and vomiting.  Genitourinary: Negative.   Musculoskeletal: Negative.  Negative for arthralgias and myalgias.  Skin:  Negative for rash and wound.  Neurological: Negative.  Negative for dizziness, syncope, weakness and headaches.  Endo/Heme/Allergies:  Negative for environmental allergies and adenopathy. Does not bruise/bleed easily.  Psychiatric/Behavioral: Negative.  Negative for agitation, dysphoric mood, sleep disturbance and suicidal ideas. The patient is not nervous/anxious.

## 2023-02-24 ENCOUNTER — Ambulatory Visit: Payer: Medicare HMO | Admitting: Critical Care Medicine

## 2023-02-25 ENCOUNTER — Other Ambulatory Visit: Payer: Self-pay

## 2023-03-02 ENCOUNTER — Other Ambulatory Visit (HOSPITAL_COMMUNITY): Payer: Self-pay

## 2023-03-05 ENCOUNTER — Other Ambulatory Visit (HOSPITAL_COMMUNITY): Payer: Self-pay

## 2023-03-11 ENCOUNTER — Other Ambulatory Visit (HOSPITAL_COMMUNITY): Payer: Self-pay

## 2023-03-20 ENCOUNTER — Other Ambulatory Visit (HOSPITAL_COMMUNITY): Payer: Self-pay

## 2023-03-31 ENCOUNTER — Other Ambulatory Visit: Payer: Self-pay

## 2023-03-31 ENCOUNTER — Other Ambulatory Visit (HOSPITAL_COMMUNITY): Payer: Self-pay

## 2023-04-01 ENCOUNTER — Other Ambulatory Visit: Payer: Self-pay

## 2023-04-13 ENCOUNTER — Other Ambulatory Visit (HOSPITAL_COMMUNITY): Payer: Self-pay

## 2023-04-13 ENCOUNTER — Other Ambulatory Visit: Payer: Self-pay | Admitting: Critical Care Medicine

## 2023-04-13 DIAGNOSIS — C049 Malignant neoplasm of floor of mouth, unspecified: Secondary | ICD-10-CM

## 2023-04-13 MED ORDER — ONDANSETRON HCL 8 MG PO TABS
4.0000 mg | ORAL_TABLET | Freq: Three times a day (TID) | ORAL | 1 refills | Status: DC | PRN
  Filled 2023-04-13: qty 30, 20d supply, fill #0

## 2023-04-14 ENCOUNTER — Other Ambulatory Visit (HOSPITAL_COMMUNITY): Payer: Self-pay

## 2023-04-14 ENCOUNTER — Encounter: Payer: Self-pay | Admitting: Hematology and Oncology

## 2023-04-14 ENCOUNTER — Encounter: Payer: Self-pay | Admitting: Adult Health

## 2023-04-19 NOTE — Progress Notes (Unsigned)
Established Patient Office Visit  Subjective:  Patient ID: Casey [name], male    DOB: 11-Jan-1966  Age: 57 y.o. MRN: [id number]  CC: Primary care visit to establish  HPI Casey [name] presents for primary care f/u   08/13/21 Casey [name] presents for primary care to establish.  This patient originally was living in [facility] but then moved to be closer to his sister in June 2020.  Prior to this in the fall 2019 in [place] he developed sores in the base of his tongue with hoarseness and swallowing difficulty.  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  He was being recommended reconstructive surgery he did not wish to have this we moved to [place] to be with his sister in June 2020 in the [place] area.  It was that time he connected with otolaryngology Dr. Pollyann Kennedy.  He also connected with medical oncology and radiation oncology at [facility].  He has had extensive radiotherapy and chemotherapy treatments to the base of his tongue.  Multiple notes are in our [facility] health system which I have reviewed in detail.  See the [facility] health epic system for these notes.  Currently the patient's not undergoing treatment he has had ulcerations in the mouth it is slowly healed multiple biopsies taken of these were negative for recurrent cancer.  He did have a recent PET scan which shows nodules in the lung which are new and a pleural-based lesion as well.  These are being observed for now.  Note the patient now is edentulous and is looking for Medicaid excepting dentist for dentures.  Patient is still smoking 4 to 5 cigarettes daily and does use a 7 mg nicotine patch.   Is able to eat some meals at this time.  He has no other complaints.  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  He has had  extensive treatments of this documented at our last visit.  Patient seen today face-to-face.  He has developed a lesion on his lip which she is concerned about.  Is been there for several months and is not healing.  Patient also request a Medicaid dentist.  Patient also complains of occasional muscle spasticity in the stomach area.  Patient also needs colon cancer screening and is agreeable to Cologuard screening. Patient declined the flu vaccine.  He is agreeable to a pneumonia vaccination. Patient is no longer smoking cigarettes at this time and is using nicotine replacement The patient would like a dental examination to see if he is a candidate for dental implants The patient did successfully quit smoking using bupropion The patient has elevated TSH with previous radiation this needs to be further evaluated  02/03/22 The patient is experiencing increased nausea vomiting and episodes of diarrhea.  This seemed to occur when his dosage of levothyroxine was increased from 75 mcg to 100 mcg daily in May.  He still had elevated TSH levels and slightly low thyroid levels at that time.  However the patient simply not able to tolerate 100 mcg Synthroid dosing.  Symptoms worsened over the past week.  Patient was seen recently by oncology he has enlarging lung nodules they are recommending biopsy he wishes to hold off and observe for now.  Concern is that he has metastatic cancer  to the lung.  Patient is accompanied with his sister who also is advocating for the patient to hold off on biopsy of the lung nodules.  Patient's not taking any nutritional supplements is not eating well.  8/14 This patient seen in return follow-up and since the last visit he had a repeat CT scan of the chest in early July showing enlargement of both left and right upper lobe lung nodules consistent with metastatic disease.  Initially he was not going to have biopsies performed but now has been referred to pulmonary medicine and they  are going to perform on August 28 CT-guided super D imaging biopsies of the lung nodules.  Patient states he would like refills on his Zofran.  He states since reducing Synthroid to 75 mcg daily his side effects from the medication have resolved.  On arrival blood pressure initially elevated but on recheck was 123/78.  He is still smoking about 3 cigarettes daily.  There are no other complaints.  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  He took several cycles of chemotherapy but has not been on any since the end of last year.  There was a question of cardiac effects of the chemotherapy but all imaging studies today did not show heart failure or heart damage.  Patient is wishing to have potentially a second opinion on his treatment plan.  There is only 1 head and neck cancer specialist in the health system at this time.  Recent CT of the chest showed progression of pulmonary masses.  04/21/23   Past Medical History:  Diagnosis Date   Arthritis    Depression    Hypothyroidism    squamous cell ca of floor of mouth 12/2018    Past Surgical History:  Procedure Laterality Date   BRONCHIAL BIOPSY  03/31/2022   Procedure: BRONCHIAL BIOPSIES;  Surgeon: Leslye Peer, MD;  Location: [facility] END[facility]COPY;  Service: Pulmonary;;   BRONCHIAL BRUSHINGS  03/31/2022   Procedure: BRONCHIAL BRUSHINGS;  Surgeon: Leslye Peer, MD;  Location: [facility] ENDOSCOPY;  Service: Pulmonary;;   BRONCHIAL NEEDLE ASPIRATION BIOPSY  03/31/2022   Procedure: BRONCHIAL NEEDLE ASPIRATION BIOPSIES;  Surgeon: Leslye Peer, MD;  Location: [facility] ENDOSCOPY;  Service: Pulmonary;;   EXCISION OF TONGUE LESION Left 09/17/2020   Procedure: Biopsy of Anterior Tongue;  Surgeon: Serena Colonel, MD;  Location: [facility];  Service: ENT;  Laterality: Left;   IR GASTROSTOMY TUBE MOD SED   04/03/2020   IR GASTROSTOMY TUBE REMOVAL  10/03/2020   IR IMAGING GUIDED PORT INSERTION  04/03/2020   IR IMAGING GUIDED PORT INSERTION  05/01/2022   IR REMOVAL TUN ACCESS W/ PORT W/O FL MOD SED  10/03/2020   IR REMOVAL TUN ACCESS W/ PORT W/O FL MOD SED  12/08/2022   VIDEO BRONCHOSCOPY WITH RADIAL ENDOBRONCHIAL ULTRASOUND  03/31/2022   Procedure: VIDEO BRONCHOSCOPY WITH RADIAL ENDOBRONCHIAL ULTRASOUND;  Surgeon: Leslye Peer, MD;  Location: [facility] ENDOSCOPY;  Service: Pulmonary;;    No family history on file.  Social History   Socioeconomic History   Marital status: Single    Spouse name: Not on file   Number of children: Not on file   Years of education: Not on file   Highest education level: Not on file  Occupational History   Not on file  Tobacco Use   Smoking  status: Every Day    Current packs/day: 0.50    Types: Cigarettes   Smokeless tobacco: Never   Tobacco comments:    32 cigarettes smoked daily 04/17/22 Casey [name]   Vaping Use   Vaping status: Never Used  Substance and Sexual Activity   Alcohol use: Not Currently    Comment: a 12 pack of beer a week   Drug use: Never   Sexual activity: Not Currently  Other Topics Concern   Not on file  Social History Narrative   Not on file   Social Determinants of Health   Financial Resource Strain: Not on file  Food Insecurity: No Food Insecurity (11/01/2020)   Received from [facility], [facility]   Hunger Vital Sign    Worried About Running Out of Food in the Last Year: Never true    Ran Out of Food in the Last Year: Never true  Transportation Needs: Unmet Transportation Needs (07/17/2022)   PRAPARE - Administrator, Civil Service (Medical): Yes    Lack of Transportation (Non-Medical): Yes  Physical Activity: Not on file  Stress: Not on file  Social Connections: Unknown (12/17/2021)   Received from [facility], [facility]   Social Network    Social Network: Not on file  Intimate Partner Violence: Unknown  (11/08/2021)   Received from [facility], [facility]   HITS    Physically Hurt: Not on file    Insult or Talk Down To: Not on file    Threaten Physical Harm: Not on file    Scream or Curse: Not on file    Outpatient Medications Prior to Visit  Medication Sig Dispense Refill   amoxicillin-clavulanate (AUGMENTIN) 875-125 MG tablet Take 1 tablet by mouth 2 (two) times daily. 20 tablet 0   buPROPion (WELLBUTRIN XL) 150 MG 24 hr tablet Take 1 tablet by mouth daily. 60 tablet 1   chlorhexidine (PERIDEX) 0.12 % solution Rinse mouth with 20 mL then spit after use. Use once to twice daily. 473 mL 3   dexamethasone (DECADRON) 1 MG tablet Take no more than 1 tablet every other day, as needed for mouth swelling. 30 tablet 1   dicyclomine (BENTYL) 10 MG capsule Take 1 capsule (10 mg total) by mouth 3 (three) times daily as needed for spasms. 30 capsule 1   fluconazole (DIFLUCAN) 200 MG tablet Take 1 tablet (200 mg total) by mouth daily. 5 tablet 2   levothyroxine (SYNTHROID) 125 MCG tablet Take 1 tablet (125 mcg total) by mouth daily. 90 tablet 3   lidocaine (XYLOCAINE) 2 % solution MIX 1 PART 2% VISCOUS LIDOCAINE WITH 1 PART WATER. SWISH AND SPIT OF DILUTED MIXTURE TO NUMB MOUTH, UP TO 6 TIMES DAILY AS NEEDED 100 mL 4   lidocaine-prilocaine (EMLA) cream Apply 1 Application topically as needed. 30 g 0   magic mouthwash (lidocaine, diphenhydrAMINE, alum & mag hydroxide) suspension Swish and spit 5 mls every 4 hours as needed (Patient not taking: Reported on 10/07/2022) 240 mL 1   methocarbamol (ROBAXIN-750) 750 MG tablet Take 1 tablet (750 mg total) by mouth every 8 (eight) hours as needed for muscle spasms. 60 tablet 1   Multiple Vitamin (MULTIVITAMIN) tablet Take 1 tablet by mouth daily.     ondansetron (ZOFRAN) 8 MG tablet Take 0.5 tablets (4 mg total) by mouth every 8 (eight) hours as needed for nausea or vomiting. 30 tablet 1   valACYclovir (VALTREX) 1000 MG tablet Take 1 tablet (1,000 mg total)  by mouth 2 (two) times daily. 20 tablet 0   No facility-administered medications prior to visit.    No Known Allergies  ROS Review of Systems  Constitutional: Negative.  Negative for fever.  HENT:  Positive for trouble swallowing. Negative for ear pain, postnasal drip, rhinorrhea, sinus pressure, sore throat and voice change.   Eyes: Negative.   Respiratory: Negative.  Negative for apnea, cough, choking, chest tightness, shortness of breath, wheezing and stridor.   Cardiovascular: Negative.  Negative for chest pain, palpitations and leg swelling.  Gastrointestinal:  Negative for abdominal distention, abdominal pain, diarrhea, nausea and vomiting.  Genitourinary: Negative.   Musculoskeletal: Negative.  Negative for arthralgias and myalgias.  Skin:  Negative for rash and wound.  Allergic/Immunologic: Negative.  Negative for environmental allergies and food allergies.  Neurological: Negative.  Negative for dizziness, syncope, weakness and headaches.  Hematological: Negative.  Negative for adenopathy. Does not bruise/bleed easily.  Psychiatric/Behavioral: Negative.  Negative for agitation, dysphoric mood, sleep disturbance and suicidal ideas. The patient is not nervous/anxious.       Objective:    Physical Exam Vitals reviewed.  Constitutional:      Appearance: He is well-developed. He is not diaphoretic.     Comments: Weight has stabilized now up to 118 pounds  HENT:     Head: Normocephalic and atraumatic.     Nose: Nose normal. No nasal deformity, septal deviation, mucosal edema or rhinorrhea.     Right Sinus: No maxillary sinus tenderness or frontal sinus tenderness.     Left Sinus: No maxillary sinus tenderness or frontal sinus tenderness.     Mouth/Throat:     Pharynx: No oropharyngeal exudate.  Eyes:     General: No scleral icterus.    Conjunctiva/sclera: Conjunctivae normal.     Pupils: Pupils are equal, round, and reactive to light.  Neck:     Thyroid: No thyromegaly.      Vascular: No carotid bruit or JVD.     Trachea: Trachea normal. No tracheal tenderness or tracheal deviation.     Comments: The tissue at the top of the neck under the jaw is firm as evidence from previous radiotherapy Cardiovascular:     Rate and Rhythm: Normal rate and regular rhythm.     Chest Wall: PMI is not displaced.     Pulses: Normal pulses. No decreased pulses.     Heart sounds: Normal heart sounds, S1 normal and S2 normal. Heart sounds not distant. No murmur heard.    No systolic murmur is present.     No diastolic murmur is present.     No friction rub. No gallop. No S3 or S4 sounds.  Pulmonary:     Effort: No tachypnea, accessory muscle usage or respiratory distress.     Breath sounds: No stridor. No decreased breath sounds, wheezing, rhonchi or rales.  Chest:     Chest wall: No tenderness.  Abdominal:     General: Bowel sounds are normal. There is no distension.     Palpations: Abdomen is soft. Abdomen is not rigid.     Tenderness: There is no abdominal tenderness. There is no guarding or rebound.  Musculoskeletal:        General: Normal range of motion.     Cervical back: Normal range of motion. No edema, erythema or rigidity. No muscular tenderness. Normal range of motion.  Lymphadenopathy:     Head:     Right side of head: No submental or submandibular adenopathy.  Left side of head: No submental or submandibular adenopathy.     Cervical: No cervical adenopathy.  Skin:    General: Skin is warm and dry.     Coloration: Skin is not pale.     Findings: No rash.     Nails: There is no clubbing.  Neurological:     Mental Status: He is alert and oriented to person, place, and time.     Sensory: No sensory deficit.  Psychiatric:        Speech: Speech normal.        Behavior: Behavior normal.     There were no vitals taken for this visit. Wt Readings from Last 3 Encounters:  10/07/22 117 lb 6.4 oz (53.3 kg)  09/16/22 115 lb 9.6 oz (52.4 kg)  06/11/22 115  lb (52.2 kg)     Health Maintenance Due  Topic Date Due   Medicare Annual Wellness (AWV)  Never done   DTaP/Tdap/Td (1 - Tdap) Never done   Zoster Vaccines- Shingrix (1 of 2) Never done   INFLUENZA VACCINE  Never done   COVID-19 Vaccine (4 - 2023-24 season) 04/05/2023    There are no preventive care reminders to display for this patient.  Lab Results  Component Value Date   TSH 18.360 (H) 06/09/2022   Lab Results  Component Value Date   WBC 6.2 09/10/2022   HGB 12.1 (L) 09/10/2022   HCT 34.6 (L) 09/10/2022   MCV 96.1 09/10/2022   PLT 257 09/10/2022   Lab Results  Component Value Date   NA 136 09/10/2022   K 3.8 09/10/2022   CO2 31 09/10/2022   GLUCOSE 84 09/10/2022   BUN 7 09/10/2022   CREATININE 0.66 09/10/2022   BILITOT 0.4 09/10/2022   ALKPHOS 63 09/10/2022   AST 40 09/10/2022   ALT 28 09/10/2022   PROT 6.8 09/10/2022   ALBUMIN 4.1 09/10/2022   CALCIUM 9.3 09/10/2022   ANIONGAP 6 09/10/2022   EGFR 104 09/17/2021   Lab Results  Component Value Date   CHOL 255 (H) 09/17/2021   Lab Results  Component Value Date   HDL 79 09/17/2021   Lab Results  Component Value Date   LDLCALC 110 (H) 09/17/2021   Lab Results  Component Value Date   TRIG 388 (H) 09/17/2021   Lab Results  Component Value Date   CHOLHDL 3.2 09/17/2021   No results found for: "HGBA1C"    Assessment & Plan:   Problem List Items Addressed This Visit   None   No orders of the defined types were placed in this encounter.  Follow-up: No follow-ups on file.    Shan Levans, MD

## 2023-04-21 ENCOUNTER — Other Ambulatory Visit (HOSPITAL_COMMUNITY): Payer: Self-pay

## 2023-04-21 ENCOUNTER — Encounter: Payer: Self-pay | Admitting: Critical Care Medicine

## 2023-04-21 ENCOUNTER — Ambulatory Visit: Payer: Medicare PPO | Attending: Critical Care Medicine | Admitting: Critical Care Medicine

## 2023-04-21 VITALS — BP 128/86 | HR 75 | Wt 120.8 lb

## 2023-04-21 DIAGNOSIS — E538 Deficiency of other specified B group vitamins: Secondary | ICD-10-CM

## 2023-04-21 DIAGNOSIS — C049 Malignant neoplasm of floor of mouth, unspecified: Secondary | ICD-10-CM

## 2023-04-21 DIAGNOSIS — C7801 Secondary malignant neoplasm of right lung: Secondary | ICD-10-CM | POA: Diagnosis not present

## 2023-04-21 DIAGNOSIS — C7802 Secondary malignant neoplasm of left lung: Secondary | ICD-10-CM

## 2023-04-21 DIAGNOSIS — C023 Malignant neoplasm of anterior two-thirds of tongue, part unspecified: Secondary | ICD-10-CM

## 2023-04-21 DIAGNOSIS — E039 Hypothyroidism, unspecified: Secondary | ICD-10-CM | POA: Diagnosis not present

## 2023-04-21 DIAGNOSIS — R109 Unspecified abdominal pain: Secondary | ICD-10-CM | POA: Diagnosis not present

## 2023-04-21 MED ORDER — LIDOCAINE VISCOUS HCL 2 % MT SOLN
OROMUCOSAL | 4 refills | Status: AC
  Filled 2023-04-21: qty 100, 2d supply, fill #0
  Filled 2023-04-25: qty 100, 2d supply, fill #1

## 2023-04-21 MED ORDER — FLUCONAZOLE 200 MG PO TABS
200.0000 mg | ORAL_TABLET | Freq: Every day | ORAL | 6 refills | Status: AC
  Filled 2023-04-21 – 2023-04-27 (×2): qty 10, 10d supply, fill #0
  Filled 2023-07-31: qty 10, 10d supply, fill #1
  Filled 2023-11-04: qty 10, 10d supply, fill #2

## 2023-04-21 MED ORDER — DICYCLOMINE HCL 10 MG PO CAPS
10.0000 mg | ORAL_CAPSULE | Freq: Three times a day (TID) | ORAL | 1 refills | Status: AC | PRN
  Filled 2023-04-21 – 2023-04-27 (×2): qty 60, 20d supply, fill #0

## 2023-04-21 MED ORDER — DEXAMETHASONE 1 MG PO TABS
ORAL_TABLET | ORAL | 1 refills | Status: AC
  Filled 2023-04-21 – 2023-04-27 (×2): qty 30, 60d supply, fill #0

## 2023-04-21 MED ORDER — CHLORHEXIDINE GLUCONATE 0.12 % MT SOLN
OROMUCOSAL | 3 refills | Status: AC
  Filled 2023-04-21: qty 473, 12d supply, fill #0
  Filled 2023-04-27: qty 473, 15d supply, fill #0
  Filled 2023-10-08: qty 473, 15d supply, fill #1

## 2023-04-21 MED ORDER — METHOCARBAMOL 750 MG PO TABS
750.0000 mg | ORAL_TABLET | Freq: Three times a day (TID) | ORAL | 1 refills | Status: AC | PRN
  Filled 2023-04-21 – 2023-04-27 (×2): qty 80, 27d supply, fill #0

## 2023-04-21 MED ORDER — LEVOTHYROXINE SODIUM 125 MCG PO TABS
125.0000 ug | ORAL_TABLET | Freq: Every day | ORAL | 3 refills | Status: AC
  Filled 2023-04-21 – 2023-04-27 (×2): qty 90, 90d supply, fill #0
  Filled 2023-12-09: qty 90, 90d supply, fill #1

## 2023-04-21 MED ORDER — ONDANSETRON HCL 8 MG PO TABS
8.0000 mg | ORAL_TABLET | Freq: Three times a day (TID) | ORAL | 1 refills | Status: DC | PRN
  Filled 2023-04-21 – 2023-04-23 (×2): qty 60, 20d supply, fill #0
  Filled 2023-06-18: qty 60, 20d supply, fill #1

## 2023-04-21 NOTE — Assessment & Plan Note (Signed)
Continue with thyroid replacement

## 2023-04-21 NOTE — Patient Instructions (Signed)
Medication refilled  Labs today Scans ordered  See PA in 6 weeks New Primary care referral will be made

## 2023-04-21 NOTE — Assessment & Plan Note (Signed)
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  He might be interested in a second opinion but has not decided he would like for [place] to see what imaging shows as there has not been any imaging since earlier this year.  Plan is to order a PET scan, CT maxillofacial, CT of the neck, CT of the chest

## 2023-04-21 NOTE — Assessment & Plan Note (Signed)
As per malignancy assessment

## 2023-04-22 ENCOUNTER — Other Ambulatory Visit (HOSPITAL_COMMUNITY): Payer: Self-pay

## 2023-04-22 ENCOUNTER — Encounter: Payer: Self-pay | Admitting: Hematology and Oncology

## 2023-04-22 ENCOUNTER — Encounter: Payer: Self-pay | Admitting: Adult Health

## 2023-04-22 ENCOUNTER — Other Ambulatory Visit: Payer: Self-pay

## 2023-04-22 ENCOUNTER — Telehealth: Payer: Self-pay

## 2023-04-22 NOTE — Telephone Encounter (Signed)
-----   Message from Shan Levans sent at 04/22/2023 12:31 PM EDT ----- Let patient know liver function normal kidneys normal blood count shows improvement of white cell count after being low with chemotherapy vitamin B12 levels are normal so B12 injections are not indicated

## 2023-04-22 NOTE — Telephone Encounter (Signed)
Pt was called and vm was left, Information has been sent to nurse pool.   

## 2023-04-22 NOTE — Progress Notes (Signed)
Let patient know liver function normal kidneys normal blood count shows improvement of white cell count after being low with chemotherapy vitamin B12 levels are normal so B12 injections are not indicated

## 2023-04-23 ENCOUNTER — Encounter: Payer: Self-pay | Admitting: Pharmacist

## 2023-04-23 ENCOUNTER — Other Ambulatory Visit (HOSPITAL_COMMUNITY): Payer: Self-pay

## 2023-04-23 ENCOUNTER — Other Ambulatory Visit: Payer: Self-pay

## 2023-04-25 ENCOUNTER — Other Ambulatory Visit (HOSPITAL_COMMUNITY): Payer: Self-pay

## 2023-04-27 ENCOUNTER — Other Ambulatory Visit: Payer: Self-pay

## 2023-04-27 ENCOUNTER — Other Ambulatory Visit (HOSPITAL_COMMUNITY): Payer: Self-pay

## 2023-04-28 ENCOUNTER — Other Ambulatory Visit: Payer: Self-pay

## 2023-05-01 ENCOUNTER — Telehealth: Payer: Self-pay

## 2023-05-01 ENCOUNTER — Other Ambulatory Visit: Payer: Self-pay | Admitting: Critical Care Medicine

## 2023-05-01 DIAGNOSIS — C049 Malignant neoplasm of floor of mouth, unspecified: Secondary | ICD-10-CM

## 2023-05-01 DIAGNOSIS — C7801 Secondary malignant neoplasm of right lung: Secondary | ICD-10-CM

## 2023-05-01 DIAGNOSIS — C023 Malignant neoplasm of anterior two-thirds of tongue, part unspecified: Secondary | ICD-10-CM

## 2023-05-01 NOTE — Progress Notes (Signed)
Ct order change

## 2023-05-01 NOTE — Telephone Encounter (Signed)
Copied from CRM [id number]. Topic: General - Inquiry >> May 01, 2023 10:22 AM Haroldine Laws wrote: Reason for CRM: Melissa with [facility] calling because the pt has several CT's on the 1st and they need a PA from insurance.  CB@  [phone] x [phone]

## 2023-05-04 NOTE — Telephone Encounter (Signed)
I got all scan approved for tomorrow  and called [name] and left a voicemail with pa number(Pa#[phone])

## 2023-05-05 ENCOUNTER — Ambulatory Visit (HOSPITAL_COMMUNITY): Admission: RE | Admit: 2023-05-05 | Payer: Medicare PPO | Source: Ambulatory Visit

## 2023-05-05 ENCOUNTER — Ambulatory Visit (HOSPITAL_COMMUNITY)
Admission: RE | Admit: 2023-05-05 | Discharge: 2023-05-05 | Disposition: A | Discharge status: Home / Self Care | Payer: Medicare PPO | Source: Ambulatory Visit | Attending: Critical Care Medicine | Admitting: Critical Care Medicine

## 2023-05-05 ENCOUNTER — Encounter (HOSPITAL_COMMUNITY): Payer: Self-pay

## 2023-05-05 DIAGNOSIS — C7801 Secondary malignant neoplasm of right lung: Secondary | ICD-10-CM | POA: Diagnosis present

## 2023-05-05 DIAGNOSIS — C7802 Secondary malignant neoplasm of left lung: Secondary | ICD-10-CM | POA: Insufficient documentation

## 2023-05-05 DIAGNOSIS — C049 Malignant neoplasm of floor of mouth, unspecified: Secondary | ICD-10-CM

## 2023-05-05 DIAGNOSIS — C023 Malignant neoplasm of anterior two-thirds of tongue, part unspecified: Secondary | ICD-10-CM

## 2023-05-05 MED ORDER — IOHEXOL 300 MG/ML  SOLN
75.0000 mL | Freq: Once | INTRAMUSCULAR | Status: AC | PRN
  Administered 2023-05-05: 75 mL via INTRAVENOUS

## 2023-05-07 ENCOUNTER — Telehealth: Payer: Self-pay | Admitting: Critical Care Medicine

## 2023-05-07 NOTE — Telephone Encounter (Signed)
Patient has called to check on the status of his CT Scan results he had done on Tuesday, 10.1.2024. Patient had 3 different CT Scans done. Please contact patient back @ # [phone].

## 2023-05-07 NOTE — Telephone Encounter (Addendum)
Called [facility] CT department to inquire about Radiologist release results. Representative stated they are behind with reading but will prioritize this reading since patient is inquiring about their results.

## 2023-05-15 NOTE — Telephone Encounter (Signed)
Late entry-  05/08/2023 at 1741  Patient was advised of results from CT scan. Advised to discuss further  regarding next steps at appt with Marylene Land at apt, 10/302024. Patient informed finding were in both lungs per radiology report.   Verbalized understanding.

## 2023-05-19 ENCOUNTER — Encounter: Payer: Self-pay | Admitting: Hematology and Oncology

## 2023-05-19 ENCOUNTER — Encounter: Payer: Self-pay | Admitting: Adult Health

## 2023-05-19 NOTE — Telephone Encounter (Signed)
TC

## 2023-06-03 ENCOUNTER — Ambulatory Visit: Payer: Medicare PPO | Attending: Physician Assistant | Admitting: Physician Assistant

## 2023-06-03 ENCOUNTER — Encounter: Payer: Self-pay | Admitting: Physician Assistant

## 2023-06-03 VITALS — BP 103/71 | HR 95 | Wt 114.8 lb

## 2023-06-03 DIAGNOSIS — E871 Hypo-osmolality and hyponatremia: Secondary | ICD-10-CM

## 2023-06-03 DIAGNOSIS — E039 Hypothyroidism, unspecified: Secondary | ICD-10-CM

## 2023-06-03 DIAGNOSIS — C7801 Secondary malignant neoplasm of right lung: Secondary | ICD-10-CM

## 2023-06-03 DIAGNOSIS — C7802 Secondary malignant neoplasm of left lung: Secondary | ICD-10-CM

## 2023-06-03 DIAGNOSIS — R918 Other nonspecific abnormal finding of lung field: Secondary | ICD-10-CM

## 2023-06-03 NOTE — Progress Notes (Signed)
Patient ID: Casey [name], male   DOB: 1966/05/07, 57 y.o.   MRN: [id number]   Casey [name], is a 57 y.o. male  YNW:[id number]  MVH:[id number]  DOB - 1966-04-29  Chief Complaint  Patient presents with   Medical Management of Chronic Issues       Subjective:   Casey [name] is a 57 y.o. male here today with his sister to go over most recent imaging.  He has elected not to undergo any further treatment for metastatic cancer.  His recent chest CT showed enlarging of nodules and new LN.  Currently he is doing well.  His appetite is good.  He denies SOB.  Bowels and bladder moving normally.  He just wanted to go over results in person.  No dysphagia.  He is interested in having an emotional support cat and his sister says she will house the cat if her brother declines but currently he is living alone and independent and is able to care for and benefit from a pet.  No cough or hemoptysis.    No problems updated.  ALLERGIES: No Known Allergies  PAST MEDICAL HISTORY: Past Medical History:  Diagnosis Date   Arthritis    Depression    Hypothyroidism    squamous cell ca of floor of mouth 12/2018    MEDICATIONS AT HOME: Prior to Admission medications   Medication Sig Start Date End Date Taking? Authorizing Provider  chlorhexidine (PERIDEX) 0.12 % solution Rinse mouth with 20 mL then spit after use. Use once to twice daily. 04/21/23  Yes Storm Frisk, MD  dexamethasone (DECADRON) 1 MG tablet Take no more than 1 tablet every other day, as needed for mouth swelling. 04/21/23  Yes Storm Frisk, MD  dicyclomine (BENTYL) 10 MG capsule Take 1 capsule (10 mg total) by mouth 3 (three) times daily as needed for spasms. 04/21/23  Yes Storm Frisk, MD  fluconazole (DIFLUCAN) 200 MG tablet Take 1 tablet (200 mg total) by mouth daily. 04/21/23  Yes Storm Frisk, MD  levothyroxine (SYNTHROID) 125 MCG tablet Take 1 tablet (125 mcg total) by mouth daily. 04/21/23  Yes Storm Frisk, MD  lidocaine (XYLOCAINE) 2 % solution MIX 1 PART 2% VISCOUS LIDOCAINE WITH 1 PART WATER. SWISH AND SPIT OF DILUTED MIXTURE TO NUMB MOUTH, UP TO 6 TIMES DAILY AS NEEDED 04/21/23 04/20/24 Yes Storm Frisk, MD  methocarbamol (ROBAXIN-750) 750 MG tablet Take 1 tablet (750 mg total) by mouth every 8 (eight) hours as needed for muscle spasms. 04/21/23  Yes Storm Frisk, MD  Multiple Vitamin (MULTIVITAMIN) tablet Take 1 tablet by mouth daily.   Yes [provider]  ondansetron (ZOFRAN) 8 MG tablet Take 1 tablet (8 mg total) by mouth every 8 (eight) hours as needed for nausea or vomiting. 04/21/23  Yes Storm Frisk, MD    ROS: Neg HEENT Neg resp Neg cardiac Neg GI Neg GU Neg MS Neg psych Neg neuro  Objective:   Vitals:   06/03/23 1523  BP: 103/71  Pulse: 95  SpO2: 100%  Weight: 114 lb 12.8 oz (52.1 kg)   Exam General appearance : Awake, alert, not in any distress. Speech Clear. Not toxic looking; thin HEENT: Atraumatic and Normocephalic Neck: Supple, no JVD. No cervical lymphadenopathy.  Chest: Good air entry bilaterally, CTAB.  No rales/rhonchi/wheezing CVS: S1 S2 regular, no murmurs.  Extremities: B/L Lower Ext shows no edema, both legs are warm to touch Neurology: Awake alert, and oriented  X 3, CN II-XII intact, Non focal Skin: No Rash  Data Review No results found for: "HGBA1C"  Assessment & Plan   1. Malignant neoplasm metastatic to both lungs [facility]) Since he is not wishing to have any further treatment, I will refer to palliative care to establish should he begin to deteriorate.   - Amb Referral to Palliative Care  2. Hypothyroidism (acquired) - Thyroid Panel With TSH - Comprehensive metabolic panel  3. Hyponatremia - Thyroid Panel With TSH - Comprehensive metabolic panel  4. Pulmonary nodules - Amb Referral to Palliative Care    Return in about 3 months (around 09/03/2023) for assign PCP for chronic conditions.  The patient  was given clear instructions to go to ER or return to medical center if symptoms don't improve, worsen or new problems develop. The patient verbalized understanding. The patient was told to call to get lab results if they haven't heard anything in the next week.      Georgian Co, PA-C [facility] [place], [place] [phone]   06/03/2023, 5:14 PM

## 2023-06-04 ENCOUNTER — Telehealth: Payer: Self-pay

## 2023-06-04 ENCOUNTER — Other Ambulatory Visit: Payer: Self-pay

## 2023-06-04 ENCOUNTER — Other Ambulatory Visit: Payer: Self-pay | Admitting: Physician Assistant

## 2023-06-04 ENCOUNTER — Encounter: Payer: Self-pay | Admitting: Hematology and Oncology

## 2023-06-04 ENCOUNTER — Encounter: Payer: Self-pay | Admitting: Adult Health

## 2023-06-04 ENCOUNTER — Other Ambulatory Visit (HOSPITAL_COMMUNITY): Payer: Self-pay

## 2023-06-04 DIAGNOSIS — E871 Hypo-osmolality and hyponatremia: Secondary | ICD-10-CM

## 2023-06-04 LAB — COMPREHENSIVE METABOLIC PANEL
ALT: 23 [IU]/L (ref 0–44)
AST: 45 [IU]/L — ABNORMAL HIGH (ref 0–40)
Albumin: 3.9 g/dL (ref 3.8–4.9)
Alkaline Phosphatase: 96 [IU]/L (ref 44–121)
BUN/Creatinine Ratio: 8 — ABNORMAL LOW (ref 9–20)
BUN: 7 mg/dL (ref 6–24)
Bilirubin Total: 0.5 mg/dL (ref 0.0–1.2)
CO2: 16 mmol/L — ABNORMAL LOW (ref 20–29)
Calcium: 9.6 mg/dL (ref 8.7–10.2)
Chloride: 89 mmol/L — ABNORMAL LOW (ref 96–106)
Creatinine, Ser: 0.87 mg/dL (ref 0.76–1.27)
Globulin, Total: 3.9 g/dL (ref 1.5–4.5)
Glucose: 87 mg/dL (ref 70–99)
Potassium: 5 mmol/L (ref 3.5–5.2)
Sodium: 128 mmol/L — ABNORMAL LOW (ref 134–144)
Total Protein: 7.8 g/dL (ref 6.0–8.5)
eGFR: 99 mL/min/{1.73_m2} (ref >59)

## 2023-06-04 LAB — THYROID PANEL WITH TSH
Free Thyroxine Index: 2.4 (ref 1.2–4.9)
T3 Uptake Ratio: 34 % (ref 24–39)
T4, Total: 7 ug/dL (ref 4.5–12.0)
TSH: 2.62 u[IU]/mL (ref 0.450–4.500)

## 2023-06-04 MED ORDER — SODIUM CHLORIDE 1 G PO TABS
1.0000 g | ORAL_TABLET | Freq: Every day | ORAL | 1 refills | Status: DC
  Filled 2023-06-04: qty 90, 90d supply, fill #0

## 2023-06-04 NOTE — Telephone Encounter (Signed)
I received a referral for palliative care services for the patient.  I spoke to Georgian Co, [place] who explained that she discussed the option of palliative care with the patient yesterday and the patient was in agreement to placing that referral.    Referral then sent to [facility]    I tried to reach the patient to inform him that the referral has been placed and I had to leave a message requesting he call me back.

## 2023-06-05 ENCOUNTER — Telehealth: Payer: Self-pay | Admitting: Critical Care Medicine

## 2023-06-05 ENCOUNTER — Telehealth: Payer: Self-pay

## 2023-06-05 NOTE — Telephone Encounter (Signed)
Pt was called and vm was left, Information has been sent to nurse pool.

## 2023-06-05 NOTE — Telephone Encounter (Signed)
Starr calling from [employer] is calling to report received a referral for pallativecare. [phone]

## 2023-06-05 NOTE — Telephone Encounter (Signed)
-----   Message from Georgian Co sent at 06/04/2023 11:20 AM EDT ----- Continue current dose of levothyroxine because your thyroid levels are great on current dose.  Sodium is still low(likely related to cancer as discussed at your office visit 10/30).  I am prescribing sodium tablets to take once daily to help with this.  Thanks, Georgian Co, PA-C

## 2023-06-08 NOTE — Telephone Encounter (Signed)
noted 

## 2023-06-09 NOTE — Telephone Encounter (Signed)
I spoke to [facility] who confirmed receipt of the referral and said they have an in person visit scheduled with him on 06/10/2023.

## 2023-06-17 ENCOUNTER — Other Ambulatory Visit (HOSPITAL_COMMUNITY): Payer: Self-pay

## 2023-06-17 ENCOUNTER — Other Ambulatory Visit: Payer: Self-pay

## 2023-06-18 ENCOUNTER — Other Ambulatory Visit (HOSPITAL_COMMUNITY): Payer: Self-pay

## 2023-06-18 ENCOUNTER — Other Ambulatory Visit: Payer: Self-pay

## 2023-07-06 ENCOUNTER — Encounter (HOSPITAL_COMMUNITY): Payer: Medicare PPO

## 2023-07-31 ENCOUNTER — Other Ambulatory Visit: Payer: Self-pay | Admitting: Critical Care Medicine

## 2023-07-31 ENCOUNTER — Other Ambulatory Visit (HOSPITAL_COMMUNITY): Payer: Self-pay

## 2023-07-31 DIAGNOSIS — C049 Malignant neoplasm of floor of mouth, unspecified: Secondary | ICD-10-CM

## 2023-07-31 MED ORDER — ONDANSETRON HCL 8 MG PO TABS
8.0000 mg | ORAL_TABLET | Freq: Three times a day (TID) | ORAL | 1 refills | Status: AC | PRN
  Filled 2023-07-31: qty 60, 20d supply, fill #0
  Filled 2023-09-12 – 2023-09-14 (×2): qty 60, 20d supply, fill #1

## 2023-08-04 ENCOUNTER — Other Ambulatory Visit (HOSPITAL_COMMUNITY): Payer: Self-pay

## 2023-08-04 ENCOUNTER — Other Ambulatory Visit: Payer: Self-pay

## 2023-08-06 ENCOUNTER — Other Ambulatory Visit: Payer: Self-pay

## 2023-08-07 ENCOUNTER — Other Ambulatory Visit (HOSPITAL_COMMUNITY): Payer: Self-pay

## 2023-08-07 ENCOUNTER — Other Ambulatory Visit: Payer: Self-pay

## 2023-09-03 ENCOUNTER — Ambulatory Visit: Payer: Medicare PPO | Admitting: Internal Medicine

## 2023-09-12 ENCOUNTER — Other Ambulatory Visit (HOSPITAL_COMMUNITY): Payer: Self-pay

## 2023-09-14 ENCOUNTER — Other Ambulatory Visit: Payer: Self-pay

## 2023-09-14 ENCOUNTER — Other Ambulatory Visit (HOSPITAL_COMMUNITY): Payer: Self-pay

## 2023-09-16 ENCOUNTER — Other Ambulatory Visit (HOSPITAL_COMMUNITY): Payer: Self-pay

## 2023-09-16 ENCOUNTER — Other Ambulatory Visit: Payer: Self-pay

## 2023-09-16 DIAGNOSIS — R0789 Other chest pain: Secondary | ICD-10-CM | POA: Diagnosis not present

## 2023-09-16 DIAGNOSIS — Z125 Encounter for screening for malignant neoplasm of prostate: Secondary | ICD-10-CM | POA: Diagnosis not present

## 2023-09-16 DIAGNOSIS — E785 Hyperlipidemia, unspecified: Secondary | ICD-10-CM | POA: Diagnosis not present

## 2023-09-16 DIAGNOSIS — F17210 Nicotine dependence, cigarettes, uncomplicated: Secondary | ICD-10-CM | POA: Diagnosis not present

## 2023-09-16 DIAGNOSIS — G893 Neoplasm related pain (acute) (chronic): Secondary | ICD-10-CM | POA: Diagnosis not present

## 2023-09-16 DIAGNOSIS — R918 Other nonspecific abnormal finding of lung field: Secondary | ICD-10-CM | POA: Diagnosis not present

## 2023-09-16 DIAGNOSIS — C7800 Secondary malignant neoplasm of unspecified lung: Secondary | ICD-10-CM | POA: Diagnosis not present

## 2023-09-16 DIAGNOSIS — E039 Hypothyroidism, unspecified: Secondary | ICD-10-CM | POA: Diagnosis not present

## 2023-09-16 DIAGNOSIS — J40 Bronchitis, not specified as acute or chronic: Secondary | ICD-10-CM | POA: Diagnosis not present

## 2023-09-16 MED ORDER — HYDROCODONE-ACETAMINOPHEN 5-325 MG PO TABS
1.0000 | ORAL_TABLET | Freq: Four times a day (QID) | ORAL | 0 refills | Status: AC
  Filled 2023-09-16 – 2023-09-17 (×2): qty 120, 30d supply, fill #0

## 2023-09-17 ENCOUNTER — Other Ambulatory Visit (HOSPITAL_COMMUNITY): Payer: Self-pay

## 2023-09-17 DIAGNOSIS — R918 Other nonspecific abnormal finding of lung field: Secondary | ICD-10-CM | POA: Diagnosis not present

## 2023-09-17 MED ORDER — LEVOFLOXACIN 500 MG PO TABS
500.0000 mg | ORAL_TABLET | Freq: Every day | ORAL | 0 refills | Status: AC
  Filled 2023-09-17 (×2): qty 10, 10d supply, fill #0

## 2023-09-18 ENCOUNTER — Other Ambulatory Visit: Payer: Self-pay

## 2023-09-18 ENCOUNTER — Other Ambulatory Visit (HOSPITAL_COMMUNITY): Payer: Self-pay

## 2023-09-22 ENCOUNTER — Other Ambulatory Visit (HOSPITAL_COMMUNITY): Payer: Self-pay

## 2023-09-28 DIAGNOSIS — E785 Hyperlipidemia, unspecified: Secondary | ICD-10-CM | POA: Diagnosis not present

## 2023-09-28 DIAGNOSIS — G893 Neoplasm related pain (acute) (chronic): Secondary | ICD-10-CM | POA: Diagnosis not present

## 2023-09-28 DIAGNOSIS — E039 Hypothyroidism, unspecified: Secondary | ICD-10-CM | POA: Diagnosis not present

## 2023-10-08 ENCOUNTER — Other Ambulatory Visit (HOSPITAL_COMMUNITY): Payer: Self-pay

## 2023-10-14 ENCOUNTER — Other Ambulatory Visit (HOSPITAL_COMMUNITY): Payer: Self-pay

## 2023-10-14 ENCOUNTER — Other Ambulatory Visit: Payer: Self-pay | Admitting: Critical Care Medicine

## 2023-10-14 DIAGNOSIS — C049 Malignant neoplasm of floor of mouth, unspecified: Secondary | ICD-10-CM

## 2023-10-15 ENCOUNTER — Other Ambulatory Visit (HOSPITAL_BASED_OUTPATIENT_CLINIC_OR_DEPARTMENT_OTHER): Payer: Self-pay

## 2023-10-17 ENCOUNTER — Other Ambulatory Visit (HOSPITAL_BASED_OUTPATIENT_CLINIC_OR_DEPARTMENT_OTHER): Payer: Self-pay

## 2023-10-19 ENCOUNTER — Other Ambulatory Visit (HOSPITAL_COMMUNITY): Payer: Self-pay

## 2023-10-19 MED ORDER — ONDANSETRON HCL 8 MG PO TABS
8.0000 mg | ORAL_TABLET | Freq: Two times a day (BID) | ORAL | 0 refills | Status: AC
  Filled 2023-10-19: qty 60, 30d supply, fill #0

## 2023-10-21 ENCOUNTER — Other Ambulatory Visit (HOSPITAL_COMMUNITY): Payer: Self-pay

## 2023-10-22 ENCOUNTER — Other Ambulatory Visit: Payer: Self-pay

## 2023-10-22 ENCOUNTER — Other Ambulatory Visit (HOSPITAL_COMMUNITY): Payer: Self-pay

## 2023-10-22 ENCOUNTER — Other Ambulatory Visit (HOSPITAL_BASED_OUTPATIENT_CLINIC_OR_DEPARTMENT_OTHER): Payer: Self-pay

## 2023-10-22 DIAGNOSIS — Z79891 Long term (current) use of opiate analgesic: Secondary | ICD-10-CM | POA: Diagnosis not present

## 2023-10-22 DIAGNOSIS — G893 Neoplasm related pain (acute) (chronic): Secondary | ICD-10-CM | POA: Diagnosis not present

## 2023-10-22 DIAGNOSIS — E039 Hypothyroidism, unspecified: Secondary | ICD-10-CM | POA: Diagnosis not present

## 2023-10-22 DIAGNOSIS — Z139 Encounter for screening, unspecified: Secondary | ICD-10-CM | POA: Diagnosis not present

## 2023-10-22 MED ORDER — HYDROCODONE-ACETAMINOPHEN 5-325 MG PO TABS
1.0000 | ORAL_TABLET | Freq: Four times a day (QID) | ORAL | 0 refills | Status: AC | PRN
  Filled 2023-10-22 (×3): qty 120, 30d supply, fill #0

## 2023-10-24 ENCOUNTER — Other Ambulatory Visit (HOSPITAL_COMMUNITY): Payer: Self-pay

## 2023-10-24 ENCOUNTER — Other Ambulatory Visit (HOSPITAL_BASED_OUTPATIENT_CLINIC_OR_DEPARTMENT_OTHER): Payer: Self-pay

## 2023-10-24 MED ORDER — LEVOTHYROXINE SODIUM 112 MCG PO TABS: 112.0000 ug | ORAL_TABLET | Freq: Every morning | ORAL | 2 refills | Status: AC

## 2023-11-02 DIAGNOSIS — G893 Neoplasm related pain (acute) (chronic): Secondary | ICD-10-CM | POA: Diagnosis not present

## 2023-11-02 DIAGNOSIS — E039 Hypothyroidism, unspecified: Secondary | ICD-10-CM | POA: Diagnosis not present

## 2023-11-02 DIAGNOSIS — E785 Hyperlipidemia, unspecified: Secondary | ICD-10-CM | POA: Diagnosis not present

## 2023-11-04 ENCOUNTER — Other Ambulatory Visit (HOSPITAL_COMMUNITY): Payer: Self-pay

## 2023-11-11 ENCOUNTER — Other Ambulatory Visit (HOSPITAL_BASED_OUTPATIENT_CLINIC_OR_DEPARTMENT_OTHER): Payer: Self-pay

## 2023-11-11 ENCOUNTER — Other Ambulatory Visit (HOSPITAL_COMMUNITY): Payer: Self-pay

## 2023-11-11 DIAGNOSIS — C049 Malignant neoplasm of floor of mouth, unspecified: Secondary | ICD-10-CM | POA: Diagnosis not present

## 2023-11-11 DIAGNOSIS — C7800 Secondary malignant neoplasm of unspecified lung: Secondary | ICD-10-CM | POA: Diagnosis not present

## 2023-11-11 DIAGNOSIS — G893 Neoplasm related pain (acute) (chronic): Secondary | ICD-10-CM | POA: Diagnosis not present

## 2023-11-11 DIAGNOSIS — F5104 Psychophysiologic insomnia: Secondary | ICD-10-CM | POA: Diagnosis not present

## 2023-11-11 MED ORDER — HYDROCODONE-ACETAMINOPHEN 10-325 MG PO TABS
1.0000 | ORAL_TABLET | ORAL | 0 refills | Status: AC
  Filled 2023-11-11: qty 180, 30d supply, fill #0

## 2023-11-18 ENCOUNTER — Other Ambulatory Visit: Payer: Self-pay

## 2023-11-18 ENCOUNTER — Other Ambulatory Visit (HOSPITAL_COMMUNITY): Payer: Self-pay

## 2023-11-18 DIAGNOSIS — E039 Hypothyroidism, unspecified: Secondary | ICD-10-CM | POA: Diagnosis not present

## 2023-11-18 DIAGNOSIS — G893 Neoplasm related pain (acute) (chronic): Secondary | ICD-10-CM | POA: Diagnosis not present

## 2023-11-18 DIAGNOSIS — K5903 Drug induced constipation: Secondary | ICD-10-CM | POA: Diagnosis not present

## 2023-11-18 DIAGNOSIS — R110 Nausea: Secondary | ICD-10-CM | POA: Diagnosis not present

## 2023-11-18 MED ORDER — ONDANSETRON HCL 8 MG PO TABS
8.0000 mg | ORAL_TABLET | Freq: Two times a day (BID) | ORAL | 0 refills | Status: DC | PRN
  Filled 2023-11-18: qty 60, 30d supply, fill #0

## 2023-11-18 MED ORDER — LEVOTHYROXINE SODIUM 112 MCG PO TABS
112.0000 ug | ORAL_TABLET | ORAL | 1 refills | Status: AC
Start: 2023-11-18 — End: ?
  Filled 2023-11-18: qty 90, 90d supply, fill #0

## 2023-11-21 ENCOUNTER — Other Ambulatory Visit (HOSPITAL_COMMUNITY): Payer: Self-pay

## 2023-11-25 DIAGNOSIS — C7800 Secondary malignant neoplasm of unspecified lung: Secondary | ICD-10-CM | POA: Diagnosis not present

## 2023-11-25 DIAGNOSIS — C023 Malignant neoplasm of anterior two-thirds of tongue, part unspecified: Secondary | ICD-10-CM | POA: Diagnosis not present

## 2023-11-25 DIAGNOSIS — E039 Hypothyroidism, unspecified: Secondary | ICD-10-CM | POA: Diagnosis not present

## 2023-11-25 DIAGNOSIS — R110 Nausea: Secondary | ICD-10-CM | POA: Diagnosis not present

## 2023-11-25 DIAGNOSIS — G893 Neoplasm related pain (acute) (chronic): Secondary | ICD-10-CM | POA: Diagnosis not present

## 2023-11-25 DIAGNOSIS — C049 Malignant neoplasm of floor of mouth, unspecified: Secondary | ICD-10-CM | POA: Diagnosis not present

## 2023-11-25 DIAGNOSIS — K5903 Drug induced constipation: Secondary | ICD-10-CM | POA: Diagnosis not present

## 2023-11-27 DIAGNOSIS — E039 Hypothyroidism, unspecified: Secondary | ICD-10-CM | POA: Diagnosis not present

## 2023-11-27 DIAGNOSIS — G893 Neoplasm related pain (acute) (chronic): Secondary | ICD-10-CM | POA: Diagnosis not present

## 2023-11-27 DIAGNOSIS — E785 Hyperlipidemia, unspecified: Secondary | ICD-10-CM | POA: Diagnosis not present

## 2023-12-09 ENCOUNTER — Other Ambulatory Visit (HOSPITAL_COMMUNITY): Payer: Self-pay

## 2023-12-10 ENCOUNTER — Other Ambulatory Visit (HOSPITAL_COMMUNITY): Payer: Self-pay

## 2023-12-10 ENCOUNTER — Other Ambulatory Visit: Payer: Self-pay

## 2023-12-10 MED ORDER — ONDANSETRON HCL 8 MG PO TABS
8.0000 mg | ORAL_TABLET | Freq: Two times a day (BID) | ORAL | 0 refills | Status: DC | PRN
  Filled 2023-12-10 – 2023-12-11 (×2): qty 60, 30d supply, fill #0

## 2023-12-10 MED ORDER — HYDROCODONE-ACETAMINOPHEN 10-325 MG PO TABS
1.0000 | ORAL_TABLET | ORAL | 0 refills | Status: DC | PRN
  Filled 2023-12-10: qty 180, 30d supply, fill #0

## 2023-12-11 ENCOUNTER — Other Ambulatory Visit (HOSPITAL_COMMUNITY): Payer: Self-pay

## 2023-12-13 ENCOUNTER — Emergency Department (HOSPITAL_BASED_OUTPATIENT_CLINIC_OR_DEPARTMENT_OTHER): Admission: EM | Admit: 2023-12-13 | Discharge: 2023-12-13 | Disposition: A | Discharge status: Home / Self Care

## 2023-12-13 ENCOUNTER — Encounter (HOSPITAL_BASED_OUTPATIENT_CLINIC_OR_DEPARTMENT_OTHER): Payer: Self-pay

## 2023-12-13 ENCOUNTER — Emergency Department (HOSPITAL_BASED_OUTPATIENT_CLINIC_OR_DEPARTMENT_OTHER)

## 2023-12-13 ENCOUNTER — Other Ambulatory Visit: Payer: Self-pay

## 2023-12-13 DIAGNOSIS — Z85118 Personal history of other malignant neoplasm of bronchus and lung: Secondary | ICD-10-CM | POA: Insufficient documentation

## 2023-12-13 DIAGNOSIS — R42 Dizziness and giddiness: Secondary | ICD-10-CM | POA: Diagnosis not present

## 2023-12-13 DIAGNOSIS — I6782 Cerebral ischemia: Secondary | ICD-10-CM | POA: Diagnosis not present

## 2023-12-13 DIAGNOSIS — E860 Dehydration: Secondary | ICD-10-CM | POA: Insufficient documentation

## 2023-12-13 DIAGNOSIS — K5900 Constipation, unspecified: Secondary | ICD-10-CM | POA: Diagnosis present

## 2023-12-13 DIAGNOSIS — Z79899 Other long term (current) drug therapy: Secondary | ICD-10-CM | POA: Diagnosis not present

## 2023-12-13 DIAGNOSIS — D72829 Elevated white blood cell count, unspecified: Secondary | ICD-10-CM | POA: Insufficient documentation

## 2023-12-13 DIAGNOSIS — E039 Hypothyroidism, unspecified: Secondary | ICD-10-CM | POA: Insufficient documentation

## 2023-12-13 DIAGNOSIS — K5903 Drug induced constipation: Secondary | ICD-10-CM | POA: Diagnosis not present

## 2023-12-13 DIAGNOSIS — R946 Abnormal results of thyroid function studies: Secondary | ICD-10-CM | POA: Insufficient documentation

## 2023-12-13 DIAGNOSIS — Z85818 Personal history of malignant neoplasm of other sites of lip, oral cavity, and pharynx: Secondary | ICD-10-CM | POA: Insufficient documentation

## 2023-12-13 DIAGNOSIS — E871 Hypo-osmolality and hyponatremia: Secondary | ICD-10-CM | POA: Insufficient documentation

## 2023-12-13 LAB — CBC WITH DIFFERENTIAL/PLATELET
Abs Immature Granulocytes: 0.39 10*3/uL — ABNORMAL HIGH (ref 0.00–0.07)
Basophils Absolute: 0.1 10*3/uL (ref 0.0–0.1)
Basophils Relative: 1 %
Eosinophils Absolute: 0.1 10*3/uL (ref 0.0–0.5)
Eosinophils Relative: 0 %
HCT: 34.3 % — ABNORMAL LOW (ref 39.0–52.0)
Hemoglobin: 11.5 g/dL — ABNORMAL LOW (ref 13.0–17.0)
Immature Granulocytes: 2 %
Lymphocytes Relative: 9 %
Lymphs Abs: 2 10*3/uL (ref 0.7–4.0)
MCH: 29.1 pg (ref 26.0–34.0)
MCHC: 33.5 g/dL (ref 30.0–36.0)
MCV: 86.8 fL (ref 80.0–100.0)
Monocytes Absolute: 1.8 10*3/uL — ABNORMAL HIGH (ref 0.1–1.0)
Monocytes Relative: 8 %
Neutro Abs: 17.6 10*3/uL — ABNORMAL HIGH (ref 1.7–7.7)
Neutrophils Relative %: 80 %
Platelets: 610 10*3/uL — ABNORMAL HIGH (ref 150–400)
RBC: 3.95 MIL/uL — ABNORMAL LOW (ref 4.22–5.81)
RDW: 17.8 % — ABNORMAL HIGH (ref 11.5–15.5)
WBC: 22 10*3/uL — ABNORMAL HIGH (ref 4.0–10.5)
nRBC: 0 % (ref 0.0–0.2)

## 2023-12-13 LAB — COMPREHENSIVE METABOLIC PANEL WITH GFR
ALT: 8 U/L (ref 0–44)
AST: 22 U/L (ref 15–41)
Albumin: 3.8 g/dL (ref 3.5–5.0)
Alkaline Phosphatase: 106 U/L (ref 38–126)
Anion gap: 12 (ref 5–15)
BUN: 9 mg/dL (ref 6–20)
CO2: 24 mmol/L (ref 22–32)
Calcium: 11.4 mg/dL — ABNORMAL HIGH (ref 8.9–10.3)
Chloride: 93 mmol/L — ABNORMAL LOW (ref 98–111)
Creatinine, Ser: 0.77 mg/dL (ref 0.61–1.24)
GFR, Estimated: >60 mL/min (ref >60)
Glucose, Bld: 90 mg/dL (ref 70–99)
Potassium: 4.6 mmol/L (ref 3.5–5.1)
Sodium: 129 mmol/L — ABNORMAL LOW (ref 135–145)
Total Bilirubin: 0.5 mg/dL (ref 0.0–1.2)
Total Protein: 7.5 g/dL (ref 6.5–8.1)

## 2023-12-13 LAB — TSH: TSH: 11.7 u[IU]/mL — ABNORMAL HIGH (ref 0.350–4.500)

## 2023-12-13 LAB — MAGNESIUM: Magnesium: 1.6 mg/dL — ABNORMAL LOW (ref 1.7–2.4)

## 2023-12-13 MED ORDER — LACTATED RINGERS IV BOLUS
1000.0000 mL | Freq: Once | INTRAVENOUS | Status: AC
  Administered 2023-12-13: 1000 mL via INTRAVENOUS

## 2023-12-13 MED ORDER — MAGNESIUM CITRATE PO SOLN: 1.0000 | Freq: Once | ORAL | 0 refills | Status: AC

## 2023-12-13 MED ORDER — POLYETHYLENE GLYCOL 3350 17 G PO PACK: 17.0000 g | PACK | Freq: Every day | ORAL | 0 refills | Status: AC

## 2023-12-13 MED ORDER — MAGNESIUM 30 MG PO TABS: 30.0000 mg | ORAL_TABLET | Freq: Every day | ORAL | 0 refills | Status: AC

## 2023-12-13 MED ORDER — FLEET ENEMA RE ENEM
1.0000 | ENEMA | Freq: Once | RECTAL | Status: AC
  Administered 2023-12-13: 1 via RECTAL
  Filled 2023-12-13: qty 1

## 2023-12-13 NOTE — ED Provider Notes (Signed)
 [name] EMERGENCY DEPARTMENT AT [facility] Provider Note   CSN: [id number] Arrival date & time: 12/13/23  1406     History  Chief Complaint  Patient presents with   Dizziness   Constipation    Casey [name] is a 58 y.o. male with squamous cell carcinoma of the mouth, terminal lung cancer, previous CVA with right sided deficits, hypothyroidism, presents with concern for constipation.  Reports last bowel bowel movement was 4 days ago.  Denies any nausea or vomiting or abdominal pain.  Feels the constipation is related to his Norco he takes for her cancer pain.  Also reports dizziness that started last night.  States that the floor feels like it shifts underneath him to the right when he goes to stand up.  Denies any double vision or lightheaded sensation.    Dizziness Constipation      Home Medications Prior to Admission medications   Medication Sig Start Date End Date Taking? Authorizing Provider  magnesium  30 MG tablet Take 1 tablet (30 mg total) by mouth daily. 12/13/23  Yes Rexie Catena, PA-C  magnesium  citrate SOLN Take 296 mLs (1 Bottle total) by mouth once for 1 dose. As needed for constipation 12/13/23 12/13/23 Yes Rexie Catena, PA-C  polyethylene glycol (MIRALAX ) 17 g packet Take 17 g by mouth daily. 12/13/23  Yes Rexie Catena, PA-C  chlorhexidine  (PERIDEX ) 0.12 % solution Rinse mouth with 20 mL then spit after use. Use once to twice daily. 04/21/23   Vernell Goldsmith, MD  dexamethasone  (DECADRON ) 1 MG tablet Take no more than 1 tablet every other day, as needed for mouth swelling. 04/21/23   Vernell Goldsmith, MD  dicyclomine  (BENTYL ) 10 MG capsule Take 1 capsule (10 mg total) by mouth 3 (three) times daily as needed for spasms. 04/21/23   Vernell Goldsmith, MD  fluconazole  (DIFLUCAN ) 200 MG tablet Take 1 tablet (200 mg total) by mouth daily. 04/21/23   Vernell Goldsmith, MD  HYDROcodone -acetaminophen  [facility]) 10-325 MG tablet Take 1 tablet by  mouth every 4 (four) hours as needed 11/11/23     HYDROcodone -acetaminophen  (NORCO) 10-325 MG tablet Take 1 tablet by mouth every 4 (four) hours as needed for pain. 12/10/23     HYDROcodone -acetaminophen  (NORCO/VICODIN) 5-325 MG tablet Take 1 tablet by mouth every 6 (six) hours as needed. 09/16/23     HYDROcodone -acetaminophen  (NORCO/VICODIN) 5-325 MG tablet Take 1 tablet by mouth every 6 (six) hours as needed. 10/22/23     levothyroxine  (SYNTHROID ) 112 MCG tablet Take 1 tablet (112 mcg total) by mouth in the morning. 10/24/23     levothyroxine  (SYNTHROID ) 112 MCG tablet Take 1 tablet (112 mcg total) by mouth every morning on an empty stomach. 11/18/23     levothyroxine  (SYNTHROID ) 125 MCG tablet Take 1 tablet (125 mcg total) by mouth daily. 04/21/23   Vernell Goldsmith, MD  lidocaine  (XYLOCAINE ) 2 % solution MIX 1 PART 2% VISCOUS LIDOCAINE  WITH 1 PART WATER. SWISH AND SPIT OF DILUTED MIXTURE TO NUMB MOUTH, UP TO 6 TIMES DAILY AS NEEDED 04/21/23 04/20/24  Vernell Goldsmith, MD  methocarbamol  (ROBAXIN -750) 750 MG tablet Take 1 tablet (750 mg total) by mouth every 8 (eight) hours as needed for muscle spasms. 04/21/23   Vernell Goldsmith, MD  Multiple Vitamin (MULTIVITAMIN) tablet Take 1 tablet by mouth daily.    [provider]  ondansetron  (ZOFRAN ) 8 MG tablet Take 1 tablet (8 mg total) by mouth every 8 (eight) hours as needed for  nausea or vomiting. 07/31/23   Vernell Goldsmith, MD  ondansetron  (ZOFRAN ) 8 MG tablet Take 1 tablet (8 mg total) by mouth every 12 (twelve) hours as needed. 10/19/23     ondansetron  (ZOFRAN ) 8 MG tablet Take 1 tablet (8 mg total) by mouth every 12 (twelve) hours as needed. 12/10/23         Allergies    Patient has no known allergies.    Review of Systems   Review of Systems  Gastrointestinal:  Positive for constipation.  Neurological:  Positive for dizziness.    Physical Exam Updated Vital Signs BP 133/70   Pulse 74   Temp 97.9 F (36.6 C) (Axillary)   Resp 16    Ht 5\' 4"  (1.626 m)   Wt 47.8 kg   SpO2 98%   BMI 18.07 kg/m  Physical Exam Vitals and nursing note reviewed.  Constitutional:      General: He is not in acute distress.    Appearance: He is well-developed.     Comments: Chronically ill appearing and frail,  no acute distress  HENT:     Head: Normocephalic and atraumatic.     Mouth/Throat:     Comments: Squamous cell carcinoma lesions on the tongue Eyes:     Extraocular Movements: Extraocular movements intact.     Conjunctiva/sclera: Conjunctivae normal.     Pupils: Pupils are equal, round, and reactive to light.  Cardiovascular:     Rate and Rhythm: Normal rate and regular rhythm.     Heart sounds: No murmur heard. Pulmonary:     Effort: Pulmonary effort is normal. No respiratory distress.     Breath sounds: Normal breath sounds.  Abdominal:     Palpations: Abdomen is soft.     Tenderness: There is no abdominal tenderness.  Musculoskeletal:        General: No swelling.     Cervical back: Neck supple.  Skin:    General: Skin is warm and dry.     Capillary Refill: Capillary refill takes less than 2 seconds.  Neurological:     Mental Status: He is alert.     Comments: Mental status: Alert and oriented to self, place, and month  Speech: Answers questions appropriately  Cranial Nerves: III, IV, VI: EOM intact, Pupils equal round and reactive, no gaze preference or deviation, no nystagmus. V: normal sensation in V1, V2, and V3 segments bilaterally VII: smiles, puffs cheeks, raises eyebrows, and closes eyes without asymmetry.  VIII: normal hearing to speech IX, X: normal palatal elevation, no uvular deviation XI: 5/5 head turn and 5/5 shoulder shrug bilaterally XII: midline tongue protrusion  Motor: Baseline 5/5 strength in the LUE and LLE. Baseline 3/5 strength in the right upper extremity due to previous CVA   Sensory: Intact sensation in upper and lower extremity bilaterally    Coordination: Normal finger to  nose and heel to shin  Gait: Normal, able to ambulate without difficulty   Psychiatric:        Mood and Affect: Mood normal.     ED Results / Procedures / Treatments   Labs (all labs ordered are listed, but only abnormal results are displayed) Labs Reviewed  CBC WITH DIFFERENTIAL/PLATELET - Abnormal; Notable for the following components:      Result Value   WBC 22.0 (*)    RBC 3.95 (*)    Hemoglobin 11.5 (*)    HCT 34.3 (*)    RDW 17.8 (*)    Platelets 610 (*)  Neutro Abs 17.6 (*)    Monocytes Absolute 1.8 (*)    Abs Immature Granulocytes 0.39 (*)    All other components within normal limits  COMPREHENSIVE METABOLIC PANEL WITH GFR - Abnormal; Notable for the following components:   Sodium 129 (*)    Chloride 93 (*)    Calcium  11.4 (*)    All other components within normal limits  MAGNESIUM  - Abnormal; Notable for the following components:   Magnesium  1.6 (*)    All other components within normal limits  TSH - Abnormal; Notable for the following components:   TSH 11.700 (*)    All other components within normal limits    EKG EKG Interpretation Date/Time:  Sunday Dec 13 2023 14:29:52 EDT Ventricular Rate:  77 PR Interval:  127 QRS Duration:  104 QT Interval:  399 QTC Calculation: 452 R Axis:   88  Text Interpretation: Sinus rhythm Borderline right axis deviation Nonspecific T abnormalities, lateral leads Confirmed by Shyrl Doyne [phone]) on 12/13/2023 4:54:21 PM  Radiology CT Head Wo Contrast Result Date: 12/13/2023 CLINICAL DATA:  Known lung CA now with dizziness, r/o mets EXAM: CT HEAD WITHOUT CONTRAST TECHNIQUE: Contiguous axial images were obtained from the base of the skull through the vertex without intravenous contrast. RADIATION DOSE REDUCTION: This exam was performed according to the departmental dose-optimization program which includes automated exposure control, adjustment of the mA and/or kV according to patient size and/or use of iterative  reconstruction technique. COMPARISON:  None available. FINDINGS: Brain: No acute intracranial hemorrhage. Generalized cerebral atrophy and periventricular chronic small vessel ischemic change. Low-density in the low left basal ganglia favors chronic ischemic change, but is nonspecific, series 2, image 16. Mild motion artifact through the posterior fossa. No midline shift or mass effect, no obvious mass lesion. No hydrocephalus. Vascular: No hyperdense vessel or unexpected calcification. Skull: Normal. Negative for fracture or focal lesion. Sinuses/Orbits: Paranasal sinuses and mastoid air cells are clear. The visualized orbits are unremarkable. Other: No scalp soft tissue lesion. IMPRESSION: 1. Low-density in the low left basal ganglia favors chronic ischemic change, but is nonspecific. Recommend MRI without and with contrast for further assessment. 2. Generalized cerebral atrophy and chronic small vessel ischemic change. Electronically Signed   By: Chadwick Colonel M.D.   On: 12/13/2023 16:37    Procedures Procedures    Medications Ordered in ED Medications  sodium phosphate  (FLEET) enema 1 enema (1 enema Rectal Given 12/13/23 1543)  lactated ringers  bolus 1,000 mL (0 mLs Intravenous Stopped 12/13/23 1701)    ED Course/ Medical Decision Making/ A&P                                 Medical Decision Making Amount and/or Complexity of Data Reviewed Labs: ordered. Radiology: ordered.  Risk OTC drugs.     Differential diagnosis includes but is not limited to vertigo, Mnire's, electrolyte abnormality, arrhythmia, metastases, anemia, opioid induced constipation  ED Course:  Upon initial evaluation, patient appears chronically ill, but no acute distress.  Stable vitals.  No new neurodeficits on exam.  However, given new dizziness and history of cancer, will obtain CT brain for further evaluation to rule out metastases.  Will obtain basic labs.  Fleet enema ordered for constipation  Labs  Ordered: I Ordered, and personally interpreted labs.  The pertinent results include:   CBC with leukocytosis of 22, hemoglobin low at 11.5 Magnesium  slightly low at 1.6 CMP with hyponatremia  at 129, calcium  elevated at 11.4 TSH elevated at 11.7  Imaging Studies ordered: I ordered imaging studies including CT head I independently visualized the imaging with scope of interpretation limited to determining acute life threatening conditions related to emergency care. Imaging showed  Low-density in the left basal ganglia favors chronic ischemic change, but nonspecific.  MRI with and without contrast recommended for further assessment.  Generalized cerebral atrophy and chronic small vessel ischemic change. I agree with the radiologist interpretation   Cardiac Monitoring: / EKG: The patient was maintained on a cardiac monitor.  I personally viewed and interpreted the cardiac monitored which showed an underlying rhythm of: Normal sinus rhythm   Medications Given: LR bolus for dehydration and hyponatremia Fleet enema for constipation  Upon re-evaluation, patient has had a bowel movement with the Fleet enema.  He has been able to walk without difficulty, feeling better after fluids.  Feel that his hyponatremia and dehydration is likely causing the dizziness.  My attending Dr. Adan Holms also evaluated patient, discussed with patient that he did have an abnormal finding on his CT head which is likely secondary to his CVA, but cannot be certain.  Discussed that MRI brain was recommended for follow-up, but he declines any further intervention at this time given his terminal lung cancer.  He does have a leukocytosis of 22, but no pain or fever to suggest infection that would warrant antibiotics or hospitalization at this time.  Dr. Adan Holms feels patient is stable and appropriate for discharge home, and patient in agreement with this plan.    Impression: Constipation likely secondary to opioid  use Hyponatremia Elevated TSH  Disposition:  The patient was discharged home with instructions to maintain bowel regimen of MiraLAX  and magnesium  citrate, and these were prescribed for patient.  Magnesium  supplementation provided for patient.  Recommended patient follow-up with PCP within the next week for recheck of white blood cell count, and for further management of constipation. Follow up with endocrinologist within the next month for follow-up of his TSH. Return precautions given.   This chart was dictated using voice recognition software, Dragon. Despite the best efforts of this provider to proofread and correct errors, errors may still occur which can change documentation meaning.          Final Clinical Impression(s) / ED Diagnoses Final diagnoses:  Drug-induced constipation  Hypomagnesemia  Leukocytosis, unspecified type    Rx / DC Orders ED Discharge Orders          Ordered    magnesium  30 MG tablet  Daily        12/13/23 1806    polyethylene glycol (MIRALAX ) 17 g packet  Daily        12/13/23 1806    magnesium  citrate SOLN   Once        12/13/23 1806              Rexie Catena, PA-C 12/13/23 Brinton Canavan, MD 12/15/23 1207

## 2023-12-13 NOTE — Discharge Instructions (Addendum)
 Drink plenty of fluids at home and eat a fiber fillet diet (lots of fruits and vegetables). Please engage in daily exercise as this also helps to maintain regular bowel movements.   Try to use the bathroom after eating a meal. Place your feet up on a small stepstool when trying to have a bowel movement.  You may take 1-2 scoops of Miralax  in the morning to maintain normal bowel movements. You may also take Ducolax (Bisacodyl) 2 tablets (10mg ) once daily in the morning. These are medications you can get over the counter at any drugstore. I have also sent a prescription in for you.  Your magnesium  was slightly low today.  You have been prescribed a magnesium  supplement to take at home.  Your sodium was low today.  You are given some fluids to help with this. Please increase your intake of salty snacks and water at home.  This may have been contributing to your dizziness.  Your thyroid  hormone, TSH, was abnormal today.  Please follow-up with your endocrinologist within the next month regarding this.  Your white blood cell count was abnormally elevated today. Please have your PCP recheck this within the next week.   Please follow up with your PCP within the next month to address strategies to prevent constipation in the future.    The CT of your head showed an ischemic change likely due to your previous stroke. MRI imaging was recommended for follow up. However, as discussed, we will not proceed MRI imaging today per your preference.  I have included your CT image results below for your reference.  Return to the ER for any unexplained fevers, if you are no longer having any bowel movements or passing gas, any other new or concerning symptoms.  "EXAM: CT HEAD WITHOUT CONTRAST   TECHNIQUE: Contiguous axial images were obtained from the base of the skull through the vertex without intravenous contrast.   RADIATION DOSE REDUCTION: This exam was performed according to the departmental  dose-optimization program which includes automated exposure control, adjustment of the mA and/or kV according to patient size and/or use of iterative reconstruction technique.   COMPARISON:  None available.   FINDINGS: Brain: No acute intracranial hemorrhage. Generalized cerebral atrophy and periventricular chronic small vessel ischemic change. Low-density in the low left basal ganglia favors chronic ischemic change, but is nonspecific, series 2, image 16. Mild motion artifact through the posterior fossa. No midline shift or mass effect, no obvious mass lesion. No hydrocephalus.   Vascular: No hyperdense vessel or unexpected calcification.   Skull: Normal. Negative for fracture or focal lesion.   Sinuses/Orbits: Paranasal sinuses and mastoid air cells are clear. The visualized orbits are unremarkable.   Other: No scalp soft tissue lesion.   IMPRESSION: 1. Low-density in the low left basal ganglia favors chronic ischemic change, but is nonspecific. Recommend MRI without and with contrast for further assessment. 2. Generalized cerebral atrophy and chronic small vessel ischemic change.     Electronically Signed   By: Chadwick Colonel M.D.   On: 12/13/2023 16:37"

## 2023-12-13 NOTE — ED Triage Notes (Signed)
 Patient arrives POV with complaints of worsening dizziness and constipation symptoms.  Patient states that he has been having intermittent dizziness, but this morning it was worse. He reports being on pain meds with which may be contributing to his constipation.   Patient currently has terminal lung cancer that he is not pursuing treatment for.

## 2023-12-15 ENCOUNTER — Other Ambulatory Visit: Payer: Self-pay

## 2023-12-17 ENCOUNTER — Encounter: Payer: Self-pay | Admitting: Pharmacist

## 2023-12-17 ENCOUNTER — Other Ambulatory Visit: Payer: Self-pay | Admitting: Pharmacist

## 2023-12-17 ENCOUNTER — Other Ambulatory Visit: Payer: Self-pay

## 2023-12-17 DIAGNOSIS — E039 Hypothyroidism, unspecified: Secondary | ICD-10-CM

## 2023-12-17 NOTE — Progress Notes (Signed)
 Communicated with [facility] and Dr. Brent Cambric. Pt is changing NDC for levothyroxine . Per Dr. Brent Cambric, will order thyroid  panel with TSH for pt to complete in 2 weeks. I called Mr. Windecker and informed of this. Pt tells me he is seeing a new/home physician and will inform them of the change.

## 2023-12-18 ENCOUNTER — Other Ambulatory Visit: Payer: Self-pay

## 2023-12-21 ENCOUNTER — Other Ambulatory Visit (HOSPITAL_COMMUNITY): Payer: Self-pay

## 2023-12-21 DIAGNOSIS — C023 Malignant neoplasm of anterior two-thirds of tongue, part unspecified: Secondary | ICD-10-CM | POA: Diagnosis not present

## 2023-12-21 DIAGNOSIS — C049 Malignant neoplasm of floor of mouth, unspecified: Secondary | ICD-10-CM | POA: Diagnosis not present

## 2023-12-21 DIAGNOSIS — G893 Neoplasm related pain (acute) (chronic): Secondary | ICD-10-CM | POA: Diagnosis not present

## 2023-12-21 DIAGNOSIS — E871 Hypo-osmolality and hyponatremia: Secondary | ICD-10-CM | POA: Diagnosis not present

## 2023-12-21 DIAGNOSIS — E785 Hyperlipidemia, unspecified: Secondary | ICD-10-CM | POA: Diagnosis not present

## 2023-12-21 DIAGNOSIS — E039 Hypothyroidism, unspecified: Secondary | ICD-10-CM | POA: Diagnosis not present

## 2023-12-21 DIAGNOSIS — K5900 Constipation, unspecified: Secondary | ICD-10-CM | POA: Diagnosis not present

## 2023-12-22 DIAGNOSIS — E785 Hyperlipidemia, unspecified: Secondary | ICD-10-CM | POA: Diagnosis not present

## 2023-12-22 DIAGNOSIS — E871 Hypo-osmolality and hyponatremia: Secondary | ICD-10-CM | POA: Diagnosis not present

## 2023-12-22 DIAGNOSIS — E039 Hypothyroidism, unspecified: Secondary | ICD-10-CM | POA: Diagnosis not present

## 2023-12-23 ENCOUNTER — Other Ambulatory Visit (HOSPITAL_BASED_OUTPATIENT_CLINIC_OR_DEPARTMENT_OTHER): Payer: Self-pay

## 2023-12-23 ENCOUNTER — Other Ambulatory Visit: Payer: Self-pay

## 2023-12-23 ENCOUNTER — Other Ambulatory Visit (HOSPITAL_COMMUNITY): Payer: Self-pay

## 2023-12-23 MED ORDER — LEVOFLOXACIN 500 MG PO TABS
500.0000 mg | ORAL_TABLET | Freq: Every day | ORAL | 0 refills | Status: AC
  Filled 2023-12-23: qty 10, 10d supply, fill #0

## 2023-12-23 MED ORDER — LEVOTHYROXINE SODIUM 125 MCG PO TABS: 125.0000 ug | ORAL_TABLET | Freq: Every day | ORAL | 1 refills | Status: AC

## 2023-12-24 ENCOUNTER — Other Ambulatory Visit (HOSPITAL_COMMUNITY): Payer: Self-pay

## 2023-12-25 DIAGNOSIS — G893 Neoplasm related pain (acute) (chronic): Secondary | ICD-10-CM | POA: Diagnosis not present

## 2023-12-25 DIAGNOSIS — C049 Malignant neoplasm of floor of mouth, unspecified: Secondary | ICD-10-CM | POA: Diagnosis not present

## 2023-12-25 DIAGNOSIS — J189 Pneumonia, unspecified organism: Secondary | ICD-10-CM | POA: Diagnosis not present

## 2023-12-25 DIAGNOSIS — C023 Malignant neoplasm of anterior two-thirds of tongue, part unspecified: Secondary | ICD-10-CM | POA: Diagnosis not present

## 2024-01-01 DIAGNOSIS — Z0000 Encounter for general adult medical examination without abnormal findings: Secondary | ICD-10-CM | POA: Diagnosis not present

## 2024-01-01 DIAGNOSIS — Z1331 Encounter for screening for depression: Secondary | ICD-10-CM | POA: Diagnosis not present

## 2024-01-04 ENCOUNTER — Encounter: Payer: Self-pay | Admitting: Hematology and Oncology

## 2024-01-04 ENCOUNTER — Encounter: Payer: Self-pay | Admitting: Adult Health

## 2024-01-04 ENCOUNTER — Other Ambulatory Visit (HOSPITAL_COMMUNITY): Payer: Self-pay

## 2024-01-06 ENCOUNTER — Encounter: Payer: Self-pay | Admitting: Hematology and Oncology

## 2024-01-06 ENCOUNTER — Other Ambulatory Visit: Payer: Self-pay

## 2024-01-06 ENCOUNTER — Other Ambulatory Visit (HOSPITAL_COMMUNITY): Payer: Self-pay

## 2024-01-06 ENCOUNTER — Encounter: Payer: Self-pay | Admitting: Adult Health

## 2024-01-06 MED ORDER — METHADONE HCL 5 MG PO TABS
5.0000 mg | ORAL_TABLET | Freq: Three times a day (TID) | ORAL | 0 refills | Status: DC
  Filled 2024-01-06: qty 45, 15d supply, fill #0

## 2024-01-06 MED ORDER — POLYETHYLENE GLYCOL 3350 17 GM/SCOOP PO POWD
ORAL | 2 refills | Status: AC
  Filled 2024-01-06: qty 238, 14d supply, fill #0
  Filled 2024-02-13 – 2024-02-17 (×2): qty 238, 14d supply, fill #1

## 2024-01-13 ENCOUNTER — Other Ambulatory Visit (HOSPITAL_COMMUNITY): Payer: Self-pay

## 2024-01-13 MED ORDER — HYDROCODONE-ACETAMINOPHEN 10-325 MG PO TABS
1.0000 | ORAL_TABLET | ORAL | 0 refills | Status: AC | PRN
  Filled 2024-01-13: qty 90, 15d supply, fill #0

## 2024-01-13 MED ORDER — ONDANSETRON HCL 8 MG PO TABS
8.0000 mg | ORAL_TABLET | Freq: Four times a day (QID) | ORAL | 0 refills | Status: DC | PRN
  Filled 2024-01-13: qty 60, 15d supply, fill #0

## 2024-01-14 ENCOUNTER — Other Ambulatory Visit: Payer: Self-pay

## 2024-01-15 ENCOUNTER — Other Ambulatory Visit (HOSPITAL_COMMUNITY): Payer: Self-pay

## 2024-01-19 ENCOUNTER — Other Ambulatory Visit (HOSPITAL_COMMUNITY): Payer: Self-pay

## 2024-01-19 MED ORDER — ONDANSETRON HCL 8 MG PO TABS
8.0000 mg | ORAL_TABLET | Freq: Four times a day (QID) | ORAL | 0 refills | Status: AC | PRN
  Filled 2024-01-19: qty 60, 15d supply, fill #0

## 2024-01-22 ENCOUNTER — Other Ambulatory Visit (HOSPITAL_COMMUNITY): Payer: Self-pay

## 2024-01-25 ENCOUNTER — Other Ambulatory Visit (HOSPITAL_BASED_OUTPATIENT_CLINIC_OR_DEPARTMENT_OTHER): Payer: Self-pay

## 2024-01-25 ENCOUNTER — Other Ambulatory Visit (HOSPITAL_COMMUNITY): Payer: Self-pay

## 2024-01-25 ENCOUNTER — Other Ambulatory Visit: Payer: Self-pay

## 2024-01-26 ENCOUNTER — Other Ambulatory Visit: Payer: Self-pay

## 2024-01-26 ENCOUNTER — Other Ambulatory Visit (HOSPITAL_COMMUNITY): Payer: Self-pay

## 2024-01-26 MED ORDER — HYDROCODONE-ACETAMINOPHEN 10-325 MG PO TABS
ORAL_TABLET | ORAL | 0 refills | Status: AC
  Filled 2024-01-26: qty 90, 15d supply, fill #0

## 2024-01-27 ENCOUNTER — Other Ambulatory Visit: Payer: Self-pay

## 2024-01-27 ENCOUNTER — Other Ambulatory Visit (HOSPITAL_COMMUNITY): Payer: Self-pay

## 2024-01-28 ENCOUNTER — Other Ambulatory Visit (HOSPITAL_COMMUNITY): Payer: Self-pay

## 2024-02-10 ENCOUNTER — Other Ambulatory Visit: Payer: Self-pay

## 2024-02-10 ENCOUNTER — Other Ambulatory Visit (HOSPITAL_COMMUNITY): Payer: Self-pay

## 2024-02-10 MED ORDER — METHADONE HCL 5 MG PO TABS
5.0000 mg | ORAL_TABLET | Freq: Three times a day (TID) | ORAL | 0 refills | Status: DC
  Filled 2024-02-10: qty 45, 15d supply, fill #0

## 2024-02-10 MED ORDER — HYDROCODONE-ACETAMINOPHEN 10-325 MG PO TABS
1.0000 | ORAL_TABLET | ORAL | 0 refills | Status: AC | PRN
  Filled 2024-02-10: qty 90, 15d supply, fill #0

## 2024-02-13 ENCOUNTER — Other Ambulatory Visit (HOSPITAL_COMMUNITY): Payer: Self-pay

## 2024-02-17 ENCOUNTER — Other Ambulatory Visit (HOSPITAL_COMMUNITY): Payer: Self-pay

## 2024-02-24 ENCOUNTER — Other Ambulatory Visit: Payer: Self-pay

## 2024-02-24 ENCOUNTER — Other Ambulatory Visit (HOSPITAL_COMMUNITY): Payer: Self-pay

## 2024-02-24 MED ORDER — METHADONE HCL 5 MG PO TABS
5.0000 mg | ORAL_TABLET | Freq: Three times a day (TID) | ORAL | 0 refills | Status: AC
  Filled 2024-02-24: qty 45, 15d supply, fill #0

## 2024-03-04 DEATH — deceased

## 2024-03-18 ENCOUNTER — Other Ambulatory Visit (HOSPITAL_COMMUNITY): Payer: Self-pay

## 2024-07-17 ENCOUNTER — Other Ambulatory Visit: Payer: Self-pay
